# Patient Record
Sex: Female | Born: 1935 | Hispanic: Yes
Health system: Southern US, Community
[De-identification: ages and names within clinical notes are randomized; demographics above are authoritative.]

## PROBLEM LIST (undated history)

## (undated) DIAGNOSIS — I639 Cerebral infarction, unspecified: Secondary | ICD-10-CM

## (undated) DIAGNOSIS — I509 Heart failure, unspecified: Secondary | ICD-10-CM

## (undated) DIAGNOSIS — F0390 Unspecified dementia without behavioral disturbance: Secondary | ICD-10-CM

## (undated) DIAGNOSIS — I499 Cardiac arrhythmia, unspecified: Secondary | ICD-10-CM

---

## 2018-01-27 ENCOUNTER — Emergency Department (HOSPITAL_COMMUNITY): Payer: Medicare Other

## 2018-01-27 ENCOUNTER — Inpatient Hospital Stay (HOSPITAL_COMMUNITY): Payer: Medicare Other

## 2018-01-27 ENCOUNTER — Inpatient Hospital Stay (HOSPITAL_COMMUNITY)
Admission: EM | Admit: 2018-01-27 | Discharge: 2018-02-19 | DRG: 004 | Disposition: A | Discharge status: Rehabilitation Facility | Payer: Medicare Other | Attending: Pulmonary Disease | Admitting: Pulmonary Disease

## 2018-01-27 DIAGNOSIS — Y92009 Unspecified place in unspecified non-institutional (private) residence as the place of occurrence of the external cause: Secondary | ICD-10-CM

## 2018-01-27 DIAGNOSIS — T502X5A Adverse effect of carbonic-anhydrase inhibitors, benzothiadiazides and other diuretics, initial encounter: Secondary | ICD-10-CM | POA: Diagnosis not present

## 2018-01-27 DIAGNOSIS — N179 Acute kidney failure, unspecified: Secondary | ICD-10-CM | POA: Diagnosis not present

## 2018-01-27 DIAGNOSIS — Z9911 Dependence on respirator [ventilator] status: Secondary | ICD-10-CM | POA: Diagnosis not present

## 2018-01-27 DIAGNOSIS — Z8701 Personal history of pneumonia (recurrent): Secondary | ICD-10-CM

## 2018-01-27 DIAGNOSIS — Z880 Allergy status to penicillin: Secondary | ICD-10-CM

## 2018-01-27 DIAGNOSIS — J9811 Atelectasis: Secondary | ICD-10-CM | POA: Diagnosis not present

## 2018-01-27 DIAGNOSIS — R29702 NIHSS score 2: Secondary | ICD-10-CM | POA: Diagnosis present

## 2018-01-27 DIAGNOSIS — I482 Chronic atrial fibrillation, unspecified: Secondary | ICD-10-CM | POA: Diagnosis present

## 2018-01-27 DIAGNOSIS — Z66 Do not resuscitate: Secondary | ICD-10-CM | POA: Diagnosis not present

## 2018-01-27 DIAGNOSIS — E876 Hypokalemia: Secondary | ICD-10-CM | POA: Diagnosis not present

## 2018-01-27 DIAGNOSIS — I5033 Acute on chronic diastolic (congestive) heart failure: Secondary | ICD-10-CM | POA: Diagnosis present

## 2018-01-27 DIAGNOSIS — T41295A Adverse effect of other general anesthetics, initial encounter: Secondary | ICD-10-CM | POA: Diagnosis not present

## 2018-01-27 DIAGNOSIS — Z9289 Personal history of other medical treatment: Secondary | ICD-10-CM

## 2018-01-27 DIAGNOSIS — I952 Hypotension due to drugs: Secondary | ICD-10-CM | POA: Diagnosis not present

## 2018-01-27 DIAGNOSIS — J9600 Acute respiratory failure, unspecified whether with hypoxia or hypercapnia: Secondary | ICD-10-CM | POA: Diagnosis not present

## 2018-01-27 DIAGNOSIS — G8191 Hemiplegia, unspecified affecting right dominant side: Secondary | ICD-10-CM | POA: Diagnosis present

## 2018-01-27 DIAGNOSIS — Z930 Tracheostomy status: Secondary | ICD-10-CM | POA: Diagnosis not present

## 2018-01-27 DIAGNOSIS — I63512 Cerebral infarction due to unspecified occlusion or stenosis of left middle cerebral artery: Secondary | ICD-10-CM | POA: Diagnosis present

## 2018-01-27 DIAGNOSIS — D72829 Elevated white blood cell count, unspecified: Secondary | ICD-10-CM | POA: Diagnosis not present

## 2018-01-27 DIAGNOSIS — R1319 Other dysphagia: Secondary | ICD-10-CM

## 2018-01-27 DIAGNOSIS — I63412 Cerebral infarction due to embolism of left middle cerebral artery: Principal | ICD-10-CM | POA: Diagnosis present

## 2018-01-27 DIAGNOSIS — Z978 Presence of other specified devices: Secondary | ICD-10-CM

## 2018-01-27 DIAGNOSIS — S0011XA Contusion of right eyelid and periocular area, initial encounter: Secondary | ICD-10-CM | POA: Diagnosis present

## 2018-01-27 DIAGNOSIS — Z7982 Long term (current) use of aspirin: Secondary | ICD-10-CM

## 2018-01-27 DIAGNOSIS — Z91040 Latex allergy status: Secondary | ICD-10-CM

## 2018-01-27 DIAGNOSIS — R609 Edema, unspecified: Secondary | ICD-10-CM | POA: Diagnosis not present

## 2018-01-27 DIAGNOSIS — I5022 Chronic systolic (congestive) heart failure: Secondary | ICD-10-CM | POA: Diagnosis not present

## 2018-01-27 DIAGNOSIS — E870 Hyperosmolality and hypernatremia: Secondary | ICD-10-CM | POA: Diagnosis not present

## 2018-01-27 DIAGNOSIS — K5900 Constipation, unspecified: Secondary | ICD-10-CM | POA: Diagnosis not present

## 2018-01-27 DIAGNOSIS — R402432 Glasgow coma scale score 3-8, at arrival to emergency department: Secondary | ICD-10-CM

## 2018-01-27 DIAGNOSIS — E785 Hyperlipidemia, unspecified: Secondary | ICD-10-CM | POA: Diagnosis not present

## 2018-01-27 DIAGNOSIS — Z931 Gastrostomy status: Secondary | ICD-10-CM

## 2018-01-27 DIAGNOSIS — I639 Cerebral infarction, unspecified: Secondary | ICD-10-CM

## 2018-01-27 DIAGNOSIS — W19XXXA Unspecified fall, initial encounter: Secondary | ICD-10-CM | POA: Diagnosis present

## 2018-01-27 DIAGNOSIS — Z8673 Personal history of transient ischemic attack (TIA), and cerebral infarction without residual deficits: Secondary | ICD-10-CM

## 2018-01-27 DIAGNOSIS — J9601 Acute respiratory failure with hypoxia: Secondary | ICD-10-CM

## 2018-01-27 DIAGNOSIS — G935 Compression of brain: Secondary | ICD-10-CM | POA: Diagnosis not present

## 2018-01-27 DIAGNOSIS — I619 Nontraumatic intracerebral hemorrhage, unspecified: Secondary | ICD-10-CM | POA: Diagnosis not present

## 2018-01-27 DIAGNOSIS — R1310 Dysphagia, unspecified: Secondary | ICD-10-CM | POA: Diagnosis present

## 2018-01-27 DIAGNOSIS — I248 Other forms of acute ischemic heart disease: Secondary | ICD-10-CM | POA: Diagnosis present

## 2018-01-27 DIAGNOSIS — G9340 Encephalopathy, unspecified: Secondary | ICD-10-CM | POA: Diagnosis present

## 2018-01-27 DIAGNOSIS — I4821 Permanent atrial fibrillation: Secondary | ICD-10-CM | POA: Diagnosis not present

## 2018-01-27 DIAGNOSIS — J9690 Respiratory failure, unspecified, unspecified whether with hypoxia or hypercapnia: Secondary | ICD-10-CM

## 2018-01-27 DIAGNOSIS — Z79899 Other long term (current) drug therapy: Secondary | ICD-10-CM

## 2018-01-27 DIAGNOSIS — Z515 Encounter for palliative care: Secondary | ICD-10-CM | POA: Diagnosis not present

## 2018-01-27 DIAGNOSIS — M7989 Other specified soft tissue disorders: Secondary | ICD-10-CM | POA: Diagnosis not present

## 2018-01-27 DIAGNOSIS — Z7189 Other specified counseling: Secondary | ICD-10-CM

## 2018-01-27 DIAGNOSIS — R197 Diarrhea, unspecified: Secondary | ICD-10-CM | POA: Diagnosis not present

## 2018-01-27 LAB — COMPREHENSIVE METABOLIC PANEL
ALK PHOS: 66 U/L (ref 38–126)
ALT: 38 U/L (ref 0–44)
ANION GAP: 14 (ref 5–15)
AST: 66 U/L — ABNORMAL HIGH (ref 15–41)
Albumin: 3.6 g/dL (ref 3.5–5.0)
BUN: 23 mg/dL (ref 8–23)
CO2: 22 mmol/L (ref 22–32)
Calcium: 9.3 mg/dL (ref 8.9–10.3)
Chloride: 102 mmol/L (ref 98–111)
Creatinine, Ser: 1.19 mg/dL — ABNORMAL HIGH (ref 0.44–1.00)
GFR calc Af Amer: 48 mL/min — ABNORMAL LOW (ref >60)
GFR calc non Af Amer: 41 mL/min — ABNORMAL LOW (ref >60)
GLUCOSE: 120 mg/dL — AB (ref 70–99)
Potassium: 3.6 mmol/L (ref 3.5–5.1)
Sodium: 138 mmol/L (ref 135–145)
Total Bilirubin: 1.1 mg/dL (ref 0.3–1.2)
Total Protein: 7.6 g/dL (ref 6.5–8.1)

## 2018-01-27 LAB — CBC
HCT: 43.7 % (ref 36.0–46.0)
Hemoglobin: 13.4 g/dL (ref 12.0–15.0)
MCH: 28.3 pg (ref 26.0–34.0)
MCHC: 30.7 g/dL (ref 30.0–36.0)
MCV: 92.4 fL (ref 80.0–100.0)
Platelets: 244 10*3/uL (ref 150–400)
RBC: 4.73 MIL/uL (ref 3.87–5.11)
RDW: 16.1 % — ABNORMAL HIGH (ref 11.5–15.5)
WBC: 13.2 10*3/uL — ABNORMAL HIGH (ref 4.0–10.5)
nRBC: 0 % (ref 0.0–0.2)

## 2018-01-27 LAB — I-STAT TROPONIN, ED: Troponin i, poc: 0.32 ng/mL (ref 0.00–0.08)

## 2018-01-27 LAB — PROTIME-INR
INR: 1.12
Prothrombin Time: 14.3 seconds (ref 11.4–15.2)

## 2018-01-27 LAB — DIFFERENTIAL
Abs Immature Granulocytes: 0.07 10*3/uL (ref 0.00–0.07)
Basophils Absolute: 0 10*3/uL (ref 0.0–0.1)
Basophils Relative: 0 %
Eosinophils Absolute: 0 10*3/uL (ref 0.0–0.5)
Eosinophils Relative: 0 %
Immature Granulocytes: 1 %
LYMPHS ABS: 0.7 10*3/uL (ref 0.7–4.0)
Lymphocytes Relative: 5 %
Monocytes Absolute: 1.5 10*3/uL — ABNORMAL HIGH (ref 0.1–1.0)
Monocytes Relative: 11 %
Neutro Abs: 10.9 10*3/uL — ABNORMAL HIGH (ref 1.7–7.7)
Neutrophils Relative %: 83 %

## 2018-01-27 LAB — GLUCOSE, CAPILLARY: Glucose-Capillary: 86 mg/dL (ref 70–99)

## 2018-01-27 LAB — MRSA PCR SCREENING: MRSA by PCR: NEGATIVE

## 2018-01-27 LAB — APTT: aPTT: 28 seconds (ref 24–36)

## 2018-01-27 LAB — I-STAT CREATININE, ED: CREATININE: 1 mg/dL (ref 0.44–1.00)

## 2018-01-27 MED ORDER — PROPOFOL 1000 MG/100ML IV EMUL
5.0000 ug/kg/min | INTRAVENOUS | Status: DC
  Administered 2018-01-28 (×2): 5 ug/kg/min via INTRAVENOUS
  Filled 2018-01-27 (×2): qty 100

## 2018-01-27 MED ORDER — SENNOSIDES-DOCUSATE SODIUM 8.6-50 MG PO TABS: 1.0000 | ORAL_TABLET | Freq: Every evening | ORAL | Status: DC | PRN

## 2018-01-27 MED ORDER — CHLORHEXIDINE GLUCONATE 0.12% ORAL RINSE (MEDLINE KIT)
15.0000 mL | Freq: Two times a day (BID) | OROMUCOSAL | Status: DC
  Administered 2018-01-27 – 2018-02-19 (×46): 15 mL via OROMUCOSAL

## 2018-01-27 MED ORDER — ASPIRIN 300 MG RE SUPP: 300.0000 mg | Freq: Every day | RECTAL | Status: DC

## 2018-01-27 MED ORDER — ACETAMINOPHEN 160 MG/5ML PO SOLN
650.0000 mg | ORAL | Status: DC | PRN
  Administered 2018-01-28 – 2018-02-16 (×6): 650 mg
  Filled 2018-01-27 (×7): qty 20.3

## 2018-01-27 MED ORDER — PANTOPRAZOLE SODIUM 40 MG PO PACK
40.0000 mg | PACK | Freq: Every day | ORAL | Status: DC
  Administered 2018-01-28 – 2018-02-19 (×21): 40 mg
  Filled 2018-01-27 (×21): qty 20

## 2018-01-27 MED ORDER — ACETAMINOPHEN 325 MG PO TABS: 650.0000 mg | ORAL_TABLET | ORAL | Status: DC | PRN

## 2018-01-27 MED ORDER — IOPAMIDOL (ISOVUE-370) INJECTION 76%
INTRAVENOUS | Status: AC
  Administered 2018-01-27: 50 mL
  Filled 2018-01-27: qty 50

## 2018-01-27 MED ORDER — ORAL CARE MOUTH RINSE
15.0000 mL | OROMUCOSAL | Status: DC
  Administered 2018-01-27 – 2018-02-19 (×225): 15 mL via OROMUCOSAL

## 2018-01-27 MED ORDER — ROCURONIUM BROMIDE 50 MG/5ML IV SOLN
70.0000 mg | Freq: Once | INTRAVENOUS | Status: DC
  Filled 2018-01-27: qty 7

## 2018-01-27 MED ORDER — ASPIRIN 325 MG PO TABS: 325.0000 mg | ORAL_TABLET | Freq: Every day | ORAL | Status: DC

## 2018-01-27 MED ORDER — ACETAMINOPHEN 650 MG RE SUPP: 650.0000 mg | RECTAL | Status: DC | PRN

## 2018-01-27 MED ORDER — STROKE: EARLY STAGES OF RECOVERY BOOK
Freq: Once | Status: AC
  Administered 2018-02-12: 22:00:00
  Filled 2018-01-27: qty 1

## 2018-01-27 MED ORDER — SODIUM CHLORIDE 0.9% FLUSH: 3.0000 mL | Freq: Once | INTRAVENOUS | Status: DC

## 2018-01-27 MED ORDER — ETOMIDATE 2 MG/ML IV SOLN
INTRAVENOUS | Status: AC | PRN
  Administered 2018-01-27: 20 mg via INTRAVENOUS

## 2018-01-27 MED ORDER — SUCCINYLCHOLINE CHLORIDE 20 MG/ML IJ SOLN
INTRAMUSCULAR | Status: AC | PRN
  Administered 2018-01-27: 70 mg via INTRAVENOUS

## 2018-01-27 MED ORDER — PROPOFOL 1000 MG/100ML IV EMUL
INTRAVENOUS | Status: AC
  Administered 2018-01-27: 17:00:00
  Filled 2018-01-27: qty 100

## 2018-01-27 NOTE — Consult Note (Addendum)
NEURO HOSPITALIST  CONSULT   Requesting Physician: Dr. Clayborne Dana    Chief Complaint: Right hemiplegia  History obtained from:  EMS  HPI:                                                                                                                                         Devonia Farro is an 85 y.o. female with unknown PMH who presented to the [facility] ED as a code stroke for right hemiplegia. She was found down by family.  Per EMS the patient lives alone. Family saw her last night at 2100 completely normal. Today at 1430 they went to her house and found her down and unresponsive. EMS was called. Per EMS right side was flaccid. No prior stroke history. Patient does have a large hematoma on the right side of her face. No seizure like activity noted. No urinary orf bowel incontinence.   ED course:  Patient was intubated in the ED. BP: 190/120 BG: 120   Date last known well: Date: 01/26/2018 Time last known well: Time: 21:00 tPA Given: No:  Outside of time window Modified Rankin: Rankin Score=0  No past medical history on file.   The histories are not reviewed yet. Please review them in the "History" navigator section and refresh this SmartLink.  No family history on file.       Social History:  has no history on file for tobacco, alcohol, and drug.  Allergies: Allergies not on file  Medications:                                                                                                                           Current Facility-Administered Medications  Medication Dose Route Frequency Provider Last Rate Last Dose  . iopamidol (ISOVUE-370) 76 % injection           . propofol (DIPRIVAN) 1000 MG/100ML infusion           . rocuronium (ZEMURON) injection 70 mg  70 mg Intravenous Once Scheidler, Rhona Leavens II, MD      . sodium chloride flush (NS) 0.9 % injection  3 mL  3 mL Intravenous Once Mesner, Barbara CowerJason, MD       No current outpatient  medications on file.    ROS:                                                                                                                                        unobtainable from patient due to mental status   General Examination:                                                                                                      Blood pressure (!) 142/106, pulse 92, resp. rate (!) 25, SpO2 100 %.  HEENT-  Right eye edematous with ecchymosis present. Lungs Intubated Extremities- Warm and well perfused Musculoskeletal- generalized edema  Abbreviated emergent neuro exam prior to intubation: Patient was able to hold her left arm up. Unable to lift RUE against gravity. Would withdraw LUE and BLE to painful stimuli. Patient would withdraw RLE more briskly than left lower extremity. No incontinence noted. Right facial hematoma. Toes down going bilaterally.    Neurological Examination after intubation Mental Status: Patient intubated, unable to follow commands. Cranial Nerves: No blink to threat. No pupillary light reflex. No doll's eye reflex. (recent pharmacological paralysis for intubation) Motor/Sensory: No withdrawal to noxious stimuli. (recent pharmacological paralysis for intubation) Deep Tendon Reflexes: muted Plantars: muted Cerebellar: UTA Gait: UTA   Lab Results: Basic Metabolic Panel: Recent Labs  Lab 01/27/18 1605  CREATININE 1.00    CBC: Recent Labs  Lab 01/27/18 1553  WBC 13.2*  NEUTROABS 10.9*  HGB 13.4  HCT 43.7  MCV 92.4  PLT 244     CBG: No results for input(s): GLUCAP in the last 168 hours.  Imaging: Ct Angio Head W Or Wo Contrast  Result Date: 01/27/2018 CLINICAL DATA:  Code stroke. Found on the ground. Right-sided weakness. EXAM: CT ANGIOGRAPHY HEAD AND NECK TECHNIQUE: Multidetector CT imaging of the head and neck was performed using the standard protocol during bolus administration of intravenous contrast. Multiplanar CT image  reconstructions and MIPs were obtained to evaluate the vascular anatomy. Carotid stenosis measurements (when applicable) are obtained utilizing NASCET criteria, using the distal internal carotid diameter as the denominator. CONTRAST:  50mL ISOVUE-370 IOPAMIDOL (ISOVUE-370) INJECTION 76% COMPARISON:  CT earlier same day FINDINGS: CTA NECK FINDINGS Aortic arch: Aortic atherosclerosis with considerable luminal irregularity. Branching pattern of great toe cephalic vessels from the arch is normal. No origin stenosis greater than 30%. Right  carotid system: Right common carotid artery is tortuous but widely patent to the bifurcation. Carotid bifurcation is free of atherosclerotic plaque or narrowing. Right cervical ICA is widely patent though tortuous. Left carotid system: Common carotid artery is tortuous but widely patent to the bifurcation. Carotid bifurcation shows minimal calcified plaque at the proximal ICA but there is no stenosis. Cervical ICA shows focal narrowing with wall thickening and irregularity in its midportion with diameter of 2.6 mm. This is a stenosis of about 50%. The wall irregularity presumably represents plaque/thrombus and could be a source of embolus. Vertebral arteries: There is atherosclerotic plaque at both vertebral artery origins but no stenosis greater than 30%. Beyond that, both vertebral arteries are widely patent through the cervical region to the foramen magnum. Skeleton: Ordinary cervical spondylosis. Upper thoracic congenital failure of separation. Other neck: No mass or lymphadenopathy. Upper chest: Right pleural effusion. Pulmonary scarring. Question interstitial edema. Review of the MIP images confirms the above findings CTA HEAD FINDINGS Anterior circulation: Both internal carotid arteries are patent through the siphon region. On the right, there is atherosclerotic calcification but no stenosis. The anterior and middle cerebral vessels are patent. On the left, there is complete  occlusion of the left M1 segment due to an embolus. Flow is present within the left anterior cerebral artery. There is poor collateral demonstration, though the contrast phase is quite early. Posterior circulation: Both vertebral arteries are patent to the basilar. Early phase contrast enhancement makes it difficult to accurately evaluate the distal vertebral arteries but I think both are sufficiently patent to the basilar. No basilar stenosis is seen. Superior cerebellar and posterior cerebral vessels are patent, though there distal branches are not yet opacified. Venous sinuses: Too early to assess venous sinuses. Anatomic variants: None other significant. Delayed phase: Not performed. Review of the MIP images confirms the above findings IMPRESSION: 1. Complete occlusion of the left M1 segment due to an embolus. Flow is present within the left anterior cerebral artery. 2. Atherosclerotic disease at both carotid bifurcations but no stenosis. 3. Focal narrowing and irregularity of the mid cervical ICA on the left with diameter of 2.6 mm. This probably represents ulcerated soft plaque and this could be a source of embolus. 4. These results were communicated to Dr. Otelia Limes at 4:50 pmon 1/26/2020by text page via the [facility] messaging system. Electronically Signed   By: Paulina Fusi M.D.   On: 01/27/2018 16:54   Ct Angio Neck W Or Wo Contrast  Result Date: 01/27/2018 CLINICAL DATA:  Code stroke. Found on the ground. Right-sided weakness. EXAM: CT ANGIOGRAPHY HEAD AND NECK TECHNIQUE: Multidetector CT imaging of the head and neck was performed using the standard protocol during bolus administration of intravenous contrast. Multiplanar CT image reconstructions and MIPs were obtained to evaluate the vascular anatomy. Carotid stenosis measurements (when applicable) are obtained utilizing NASCET criteria, using the distal internal carotid diameter as the denominator. CONTRAST:  40mL ISOVUE-370 IOPAMIDOL (ISOVUE-370)  INJECTION 76% COMPARISON:  CT earlier same day FINDINGS: CTA NECK FINDINGS Aortic arch: Aortic atherosclerosis with considerable luminal irregularity. Branching pattern of great toe cephalic vessels from the arch is normal. No origin stenosis greater than 30%. Right carotid system: Right common carotid artery is tortuous but widely patent to the bifurcation. Carotid bifurcation is free of atherosclerotic plaque or narrowing. Right cervical ICA is widely patent though tortuous. Left carotid system: Common carotid artery is tortuous but widely patent to the bifurcation. Carotid bifurcation shows minimal calcified plaque at the proximal ICA but  there is no stenosis. Cervical ICA shows focal narrowing with wall thickening and irregularity in its midportion with diameter of 2.6 mm. This is a stenosis of about 50%. The wall irregularity presumably represents plaque/thrombus and could be a source of embolus. Vertebral arteries: There is atherosclerotic plaque at both vertebral artery origins but no stenosis greater than 30%. Beyond that, both vertebral arteries are widely patent through the cervical region to the foramen magnum. Skeleton: Ordinary cervical spondylosis. Upper thoracic congenital failure of separation. Other neck: No mass or lymphadenopathy. Upper chest: Right pleural effusion. Pulmonary scarring. Question interstitial edema. Review of the MIP images confirms the above findings CTA HEAD FINDINGS Anterior circulation: Both internal carotid arteries are patent through the siphon region. On the right, there is atherosclerotic calcification but no stenosis. The anterior and middle cerebral vessels are patent. On the left, there is complete occlusion of the left M1 segment due to an embolus. Flow is present within the left anterior cerebral artery. There is poor collateral demonstration, though the contrast phase is quite early. Posterior circulation: Both vertebral arteries are patent to the basilar. Early phase  contrast enhancement makes it difficult to accurately evaluate the distal vertebral arteries but I think both are sufficiently patent to the basilar. No basilar stenosis is seen. Superior cerebellar and posterior cerebral vessels are patent, though there distal branches are not yet opacified. Venous sinuses: Too early to assess venous sinuses. Anatomic variants: None other significant. Delayed phase: Not performed. Review of the MIP images confirms the above findings IMPRESSION: 1. Complete occlusion of the left M1 segment due to an embolus. Flow is present within the left anterior cerebral artery. 2. Atherosclerotic disease at both carotid bifurcations but no stenosis. 3. Focal narrowing and irregularity of the mid cervical ICA on the left with diameter of 2.6 mm. This probably represents ulcerated soft plaque and this could be a source of embolus. 4. These results were communicated to Dr. Otelia Limes at 4:50 pmon 1/26/2020by text page via the [facility] messaging system. Electronically Signed   By: Paulina Fusi M.D.   On: 01/27/2018 16:54   Ct Cervical Spine Wo Contrast  Result Date: 01/27/2018 CLINICAL DATA:  Found down. Stroke. Clinical concern for cervical spine fracture. EXAM: CT CERVICAL SPINE WITHOUT CONTRAST TECHNIQUE: Multidetector CT imaging of the cervical spine was performed without intravenous contrast. Multiplanar CT image reconstructions were also generated. COMPARISON:  Head CT obtained at the same time. FINDINGS: Alignment: Minimal reversal of the normal lordosis. No subluxations. Skull base and vertebrae: No acute fracture. No primary bone lesion or focal pathologic process. Soft tissues and spinal canal: No prevertebral fluid or swelling. No visible canal hematoma. Disc levels:  Multilevel degenerative changes. Upper chest: Minimal biapical bullous changes. Small to moderate-sized right pleural effusion. Other: Endotracheal and orogastric tubes are in place. Small right lobe thyroid nodule measuring 6  mm in maximum diameter. Minimal carotid artery calcification on the left. IMPRESSION: 1. No fracture or subluxation. 2. Minimal reversal of the normal lordosis. 3. Multilevel degenerative changes. 4. Small to moderate-sized right pleural effusion. 5. Minimal left carotid artery atheromatous calcification. 6. 6 mm right lobe thyroid nodule. This is too small to characterize, but most likely benign in the absence of known clinical risk factors for thyroid carcinoma. Electronically Signed   By: Beckie Salts M.D.   On: 01/27/2018 16:46   Dg Chest Portable 1 View  Result Date: 01/27/2018 CLINICAL DATA:  Status post intubation EXAM: PORTABLE CHEST 1 VIEW COMPARISON:  None. FINDINGS:  Endotracheal tube is noted 2.5 cm above the carina. Gastric catheter extends into the stomach. Cardiac shadow is mildly enlarged. Aortic calcifications are seen. Bibasilar atelectatic changes are noted as well as suggestion of small effusions. No bony abnormality is noted. IMPRESSION: Tubes and lines as described above. Bibasilar atelectasis and likely small effusions. Electronically Signed   By: Alcide Clever M.D.   On: 01/27/2018 16:24   Ct Head Code Stroke Wo Contrast  Result Date: 01/27/2018 CLINICAL DATA:  Code stroke. Last seen normal 2100 hours yesterday. Flaccid on the right side. EXAM: CT HEAD WITHOUT CONTRAST TECHNIQUE: Contiguous axial images were obtained from the base of the skull through the vertex without intravenous contrast. COMPARISON:  None. FINDINGS: Brain: Brainstem and cerebellum are unremarkable except for chronic small-vessel ischemic changes of the pons. Old infarction in the left occipital lobe. Acute infarction in the left hemisphere in the MCA territory affecting the insula, deep white matter and cortical brain in 3 middle cerebral artery sectors. No evidence of hemorrhage. No mass effect or shift. Chronic small-vessel ischemic changes affect the white matter elsewhere. Vascular: Hyperdense left MCA in the  distal M1/bifurcation region. Skull: Negative Sinuses/Orbits: Clear/normal Other: None ASPECTS (Alberta Stroke Program Early CT Score) - Ganglionic level infarction (caudate, lentiform nuclei, internal capsule, insula, M1-M3 cortex): 4 - Supraganglionic infarction (M4-M6 cortex): 2 Total score (0-10 with 10 being normal): 6 IMPRESSION: 1. Acute infarction in the left MCA territory affecting the insula, deep white matter and cortical brain in 3 middle cerebral artery sectors. No hemorrhage or mass effect. 2. ASPECTS is 6. 3. Hyperdense left MCA. *This report was discussed by telephone with Dr. Otelia Limes at 1621 hours. Electronically Signed   By: Paulina Fusi M.D.   On: 01/27/2018 16:31   Valentina Lucks, MSN, NP-C [facility] [phone] 01/27/2018, 4:14 PM    Assessment: 85 y.o. female with PMHx of pneumonia who presented to [facility] ED as a code stroke for right side flaccidity. She was found down by family. Patient was intubated in the ED.  1. CT head following intubation for airway protection reveals an acute infarction in the left MCA territory affecting the insula, deep white matter and cortical brain in 3 middle cerebral artery sectors. No hemorrhage or mass effect. ASPECTS is 6. Hyperdense left MCA sign is noted.  2. CTA head: Complete occlusion of the left M1 segment due to an embolus. Flow is present within the left anterior cerebral artery.  3. CTA neck: Atherosclerotic disease at both carotid bifurcations but no stenosis. Focal narrowing and irregularity of the mid cervical ICA on the left with diameter of 2.6 mm. This probably represents ulcerated soft plaque and this could be a source of embolus. 4. Patient not a TPA candidate d/t presenting outside of the time window.  5. Not an IR candidate due to completed left MCA stroke with unacceptably high risk of hemorrhagic conversion.   6. Stroke Risk Factors - none known    Recommendations: -- BP goal: Permissive HTN up to 220/110  mmHg -- MRI Brain  -- Echocardiogram -- ASA 325 mg per tube or 300 mg per rectum qd -- Benefits of statin most likely outweighed by risks given advanced age  -- HgbA1c, fasting lipid panel -- PT consult, OT consult, Speech consult after extubation --Telemetry monitoring -- Frequent neuro checks -- Stroke swallow screen -- Vent management and management of medical comorbidities per ICU team -- High risk of mortality given advanced age and the size of the stroke. If  the patient survives this stroke, she may be a candidate for left CEA -- Repeat CT head in 24 hours if MRI not yet completed.  -- Please page stroke NP  Or  PA  Or MD from 8am -4 pm  as this patient from this time will be  followed by the stroke.   You can look them up on [email or web address]  Password [email or web address]  45 minutes spent in the emergent neurological evaluation and management of this critically ill patient  I have examined the patient. 85 year old female presenting with completed left MCA stroke. Exam reveals left sided motor deficit. CT images reviewed with completed left MCA stroke and left M1 occlusion noted. Plan is initiation of ASA and permissive HTN with stroke imaging work up. Frequent neuro checks and STAT CT head if her condition deteriorates.  Electronically signed: Dr. Caryl PinaEric Clemence Stillings

## 2018-01-27 NOTE — H&P (Signed)
NAME:  Kelli Vazquez, MRN:  [id number], DOB:  1929-06-22, LOS: 0 ADMISSION DATE:  01/27/2018, CONSULTATION DATE:  01/27/2018 REFERRING MD:  Dr. Clayborne Dana, MD CHIEF COMPLAINT: AMS  Brief History   85yo female with hypertension, CHF, and afib (possible new onset) presenting after being found by her daughter unconscious with embolic left MCA stroke.   History of present illness   Patient is an 85yo female with CHF and hypertension. She was found unconscious on the floor by her daughter in her home at 1500, last known time conscious at 2000 the evening before. She lives alone, and previous to this was at baseline independent mental status. She was brought in by EMS and intubated for airway protection.   CT head showed left embolic MCA stroke and neurology was consulted. She was noted to have atrial fibrillation on EKG but family states she was not previously diagnosed with afib. She sees Dr. Luiz Iron in [facility], [place] as her PCP. Per family she takes metolazone, asa, and no other blood thinners.  PCCM was consulted for airway management and she will be admitted to the ICU.   Past Medical History  HTN CHF  Significant Hospital Events   Intubated 1/26  Consults:  PCCM Neurology   Procedures:  none  Significant Diagnostic Tests:  CTA 1/26: 1. Complete occlusion of the left M1 segment due to an embolus. Focal narrowing and irregularity of the mid cervical ICA on the left with diameter of 2.6 mm. This probably represents ulcerated soft plaque and this could be a source of embolus.  Micro Data:  none  Antimicrobials:  none  Interim history/subjective:  When seen in the ED patient was sedated and intubated. Family at bedside, discussed patient's condition and seriousness of her prognosis.   Objective   Blood pressure (!) 162/118, pulse 70, resp. rate 18, height 5\' 3"  (1.6 m), weight 68 kg, SpO2 100 %.    Vent Mode: PRVC FiO2 (%):  [40 %] 40 % Set Rate:  [20 bmp] 20 bmp Vt Set:  [460 mL]  460 mL PEEP:  [5 cmH20] 5 cmH20 Plateau Pressure:  [20 cmH20] 20 cmH20  No intake or output data in the 24 hours ending 01/27/18 1748 Filed Weights   01/27/18 1724  Weight: 68 kg    Examination: General: sedated, NAD HENT: ETT tube in place, hematoma around right eye  Eyes: left pupil equal and reactive, unable to open right lid  Lungs: +rhonchi and crackles bilaterally  Cardiovascular: irregular rate and rhythm, no m/r/g Abdomen: soft, non-distended, does not grimace with palpation  Extremities: no edema, intermittently moving left extremity > right Neuro: sedated  Resolved Hospital Problem list     Assessment & Plan:  Acute Ventilatory Dependent Respiratory Failure: Due to AMS, intubated for airway protection. -- Full vent support -- Propofol for RASS goal 0 to -1 -- AM CXR, abg  Large MCA Stroke: Last known normal 9 pm yesterday, found down by family at 3 pm. Outside of window for tpa or intervention. Obtunded with right sided paralysis on arrival.  -- Neuro consult -- MRI Brain  -- Allow permissive HTN (SBP < 220 and DBP < 120) -- Lipid panel, Hgb A1c -- ASA 325 daily  -- Statin pending lipid panel -- Echocardiogram  -- PT / OT / SLP once extubated -- Frequent neuro checks -- NIHSS    Hx of Heart Failure Hx HTN -- Needs med rec -- Caution with IVFs -- Permissive HTN   Best practice:  Diet: NPO  Pain/Anxiety/Delirium protocol (if indicated): propofol VAP protocol (if indicated): yes DVT prophylaxis: SCDs GI prophylaxis: PPI  Glucose control: none Mobility: intubated, sedated Code Status: Full Family Communication: discussed with family at bedside Disposition: admit to ICU   Labs   CBC: Recent Labs  Lab 01/27/18 1553  WBC 13.2*  NEUTROABS 10.9*  HGB 13.4  HCT 43.7  MCV 92.4  PLT 244    Basic Metabolic Panel: Recent Labs  Lab 01/27/18 1553 01/27/18 1605  NA 138  --   K 3.6  --   CL 102  --   CO2 22  --   GLUCOSE 120*  --   BUN 23  --    CREATININE 1.19* 1.00  CALCIUM 9.3  --    GFR: Estimated Creatinine Clearance: 37.4 mL/min (by C-G formula based on SCr of 1 mg/dL). Recent Labs  Lab 01/27/18 1553  WBC 13.2*    Liver Function Tests: Recent Labs  Lab 01/27/18 1553  AST 66*  ALT 38  ALKPHOS 66  BILITOT 1.1  PROT 7.6  ALBUMIN 3.6   No results for input(s): LIPASE, AMYLASE in the last 168 hours. No results for input(s): AMMONIA in the last 168 hours.  ABG No results found for: PHART, PCO2ART, PO2ART, HCO3, TCO2, ACIDBASEDEF, O2SAT   Coagulation Profile: Recent Labs  Lab 01/27/18 1553  INR 1.12    Cardiac Enzymes: No results for input(s): CKTOTAL, CKMB, CKMBINDEX, TROPONINI in the last 168 hours.  HbA1C: No results found for: HGBA1C  CBG: No results for input(s): GLUCAP in the last 168 hours.  Review of Systems:   Unable to obtain as patient is intubated and sedated   Past Medical History  She,  has no past medical history on file.   Surgical History    No known surgical history   Social History    Patient lives at home alone  Unknown if she drinks or smokes   Family History   Her family history is not on file.   Allergies Not on File   Home Medications  Prior to Admission medications   Not on File     Critical care time:

## 2018-01-27 NOTE — Progress Notes (Signed)
Patient intubated for airway protection due to unresponsiveness, good color change on ETCO2 detector, good BBS, SATS 100%, placed on above vent settings MD aware, will continue to monitor.

## 2018-01-27 NOTE — ED Triage Notes (Signed)
Pt arrives via EMS from home alone where she was last seen by family at 9:30 last night. Pt found pt at 2:30 today on floor with hematoma on eye.

## 2018-01-27 NOTE — ED Provider Notes (Signed)
[facility] EMERGENCY DEPARTMENT Provider Note   CSN: [id number] Arrival date & time: 01/27/18  1548     History   Chief Complaint Chief Complaint  Patient presents with  . Code Stroke    HPI Analis Distler is a 85 y.o. female.  HPI   Braylin Formby is a 85 y.o. female with PMH of unknown who presents via EMS as a code stroke.  She reportedly was last seen normal by her family last night around 9 or 9:30 PM.  They had not heard from her yet this morning and then at 2:30 PM they checked on her and found her unresponsive with what appeared to be snoring respirations.  Hematoma to the right periorbital space.  Patient was tachycardic on EMS initial presentation in the low 100s and hypertensive in the 160s to 180s.  Glucose normal.  No past medical history on file.  Patient Active Problem List   Diagnosis Date Noted  . Acute ischemic left MCA stroke ([place]) 01/27/2018  . Respiratory failure ([place])      OB History   No obstetric history on file.      Home Medications    Prior to Admission medications   Not on File    Family History No family history on file.  Social History Social History   Tobacco Use  . Smoking status: Not on file  Substance Use Topics  . Alcohol use: Not on file  . Drug use: Not on file     Allergies   Patient has no allergy information on record.   Review of Systems Review of Systems  Unable to perform ROS: Mental status change     Physical Exam Updated Vital Signs BP (!) 153/89   Pulse 84   Temp 98.3 F (36.8 C) (Oral)   Resp 20   Ht 5' 3"  (1.6 m)   Wt 68 kg   SpO2 100%   BMI 26.57 kg/m   Physical Exam Vitals signs and nursing note reviewed.  Constitutional:      General: She is in acute distress.     Appearance: She is well-developed. She is ill-appearing.     Interventions: Face mask in place.  HENT:     Head: Normocephalic and atraumatic. Right periorbital erythema present. No Battle's sign, left  periorbital erythema or laceration.     Comments: Right periorbital hematoma, small to moderate. Eyes:     Conjunctiva/sclera: Conjunctivae normal.     Pupils: Pupils are equal, round, and reactive to light.     Comments: Pupils 3 to 4 mm and sluggish but reactive bilaterally.  Neck:     Musculoskeletal: Neck supple.  Cardiovascular:     Rate and Rhythm: Regular rhythm. Tachycardia present.     Heart sounds: S1 normal and S2 normal. No murmur.  Pulmonary:     Effort: Accessory muscle usage present. No respiratory distress.     Breath sounds: Transmitted upper airway sounds present. Rhonchi (Bilateral.) present.     Comments: Stertor present Abdominal:     Palpations: Abdomen is soft.     Tenderness: There is no abdominal tenderness.  Skin:    General: Skin is warm and dry.  Neurological:     Mental Status: She is unresponsive.     GCS: GCS eye subscore is 1. GCS verbal subscore is 1. GCS motor subscore is 4.     Comments: Spontaneously moves her left forearm and hand only.      ED Treatments /  Results  Labs (all labs ordered are listed, but only abnormal results are displayed) Labs Reviewed  CBC - Abnormal; Notable for the following components:      Result Value   WBC 13.2 (*)    RDW 16.1 (*)    All other components within normal limits  DIFFERENTIAL - Abnormal; Notable for the following components:   Neutro Abs 10.9 (*)    Monocytes Absolute 1.5 (*)    All other components within normal limits  COMPREHENSIVE METABOLIC PANEL - Abnormal; Notable for the following components:   Glucose, Bld 120 (*)    Creatinine, Ser 1.19 (*)    AST 66 (*)    GFR calc non Af Amer 41 (*)    GFR calc Af Amer 48 (*)    All other components within normal limits  I-STAT TROPONIN, ED - Abnormal; Notable for the following components:   Troponin i, poc 0.32 (*)    All other components within normal limits  MRSA PCR SCREENING  PROTIME-INR  APTT  GLUCOSE, CAPILLARY  HEMOGLOBIN A1C  LIPID  PANEL  COMPREHENSIVE METABOLIC PANEL  CBC  BLOOD GAS, ARTERIAL  MAGNESIUM  PHOSPHORUS  CBG MONITORING, ED  I-STAT CREATININE, ED    EKG None  Radiology Ct Angio Head W Or Wo Contrast  Result Date: 01/27/2018 CLINICAL DATA:  Code stroke. Found on the ground. Right-sided weakness. EXAM: CT ANGIOGRAPHY HEAD AND NECK TECHNIQUE: Multidetector CT imaging of the head and neck was performed using the standard protocol during bolus administration of intravenous contrast. Multiplanar CT image reconstructions and MIPs were obtained to evaluate the vascular anatomy. Carotid stenosis measurements (when applicable) are obtained utilizing NASCET criteria, using the distal internal carotid diameter as the denominator. CONTRAST:  24m ISOVUE-370 IOPAMIDOL (ISOVUE-370) INJECTION 76% COMPARISON:  CT earlier same day FINDINGS: CTA NECK FINDINGS Aortic arch: Aortic atherosclerosis with considerable luminal irregularity. Branching pattern of great toe cephalic vessels from the arch is normal. No origin stenosis greater than 30%. Right carotid system: Right common carotid artery is tortuous but widely patent to the bifurcation. Carotid bifurcation is free of atherosclerotic plaque or narrowing. Right cervical ICA is widely patent though tortuous. Left carotid system: Common carotid artery is tortuous but widely patent to the bifurcation. Carotid bifurcation shows minimal calcified plaque at the proximal ICA but there is no stenosis. Cervical ICA shows focal narrowing with wall thickening and irregularity in its midportion with diameter of 2.6 mm. This is a stenosis of about 50%. The wall irregularity presumably represents plaque/thrombus and could be a source of embolus. Vertebral arteries: There is atherosclerotic plaque at both vertebral artery origins but no stenosis greater than 30%. Beyond that, both vertebral arteries are widely patent through the cervical region to the foramen magnum. Skeleton: Ordinary cervical  spondylosis. Upper thoracic congenital failure of separation. Other neck: No mass or lymphadenopathy. Upper chest: Right pleural effusion. Pulmonary scarring. Question interstitial edema. Review of the MIP images confirms the above findings CTA HEAD FINDINGS Anterior circulation: Both internal carotid arteries are patent through the siphon region. On the right, there is atherosclerotic calcification but no stenosis. The anterior and middle cerebral vessels are patent. On the left, there is complete occlusion of the left M1 segment due to an embolus. Flow is present within the left anterior cerebral artery. There is poor collateral demonstration, though the contrast phase is quite early. Posterior circulation: Both vertebral arteries are patent to the basilar. Early phase contrast enhancement makes it difficult to accurately evaluate the distal  vertebral arteries but I think both are sufficiently patent to the basilar. No basilar stenosis is seen. Superior cerebellar and posterior cerebral vessels are patent, though there distal branches are not yet opacified. Venous sinuses: Too early to assess venous sinuses. Anatomic variants: None other significant. Delayed phase: Not performed. Review of the MIP images confirms the above findings IMPRESSION: 1. Complete occlusion of the left M1 segment due to an embolus. Flow is present within the left anterior cerebral artery. 2. Atherosclerotic disease at both carotid bifurcations but no stenosis. 3. Focal narrowing and irregularity of the mid cervical ICA on the left with diameter of 2.6 mm. This probably represents ulcerated soft plaque and this could be a source of embolus. 4. These results were communicated to Dr. Cheral Marker at 4:50 pmon 1/26/2020by text page via the [facility] messaging system. Electronically Signed   By: Nelson Chimes M.D.   On: 01/27/2018 16:54   Ct Angio Neck W Or Wo Contrast  Result Date: 01/27/2018 CLINICAL DATA:  Code stroke. Found on the ground.  Right-sided weakness. EXAM: CT ANGIOGRAPHY HEAD AND NECK TECHNIQUE: Multidetector CT imaging of the head and neck was performed using the standard protocol during bolus administration of intravenous contrast. Multiplanar CT image reconstructions and MIPs were obtained to evaluate the vascular anatomy. Carotid stenosis measurements (when applicable) are obtained utilizing NASCET criteria, using the distal internal carotid diameter as the denominator. CONTRAST:  30m ISOVUE-370 IOPAMIDOL (ISOVUE-370) INJECTION 76% COMPARISON:  CT earlier same day FINDINGS: CTA NECK FINDINGS Aortic arch: Aortic atherosclerosis with considerable luminal irregularity. Branching pattern of great toe cephalic vessels from the arch is normal. No origin stenosis greater than 30%. Right carotid system: Right common carotid artery is tortuous but widely patent to the bifurcation. Carotid bifurcation is free of atherosclerotic plaque or narrowing. Right cervical ICA is widely patent though tortuous. Left carotid system: Common carotid artery is tortuous but widely patent to the bifurcation. Carotid bifurcation shows minimal calcified plaque at the proximal ICA but there is no stenosis. Cervical ICA shows focal narrowing with wall thickening and irregularity in its midportion with diameter of 2.6 mm. This is a stenosis of about 50%. The wall irregularity presumably represents plaque/thrombus and could be a source of embolus. Vertebral arteries: There is atherosclerotic plaque at both vertebral artery origins but no stenosis greater than 30%. Beyond that, both vertebral arteries are widely patent through the cervical region to the foramen magnum. Skeleton: Ordinary cervical spondylosis. Upper thoracic congenital failure of separation. Other neck: No mass or lymphadenopathy. Upper chest: Right pleural effusion. Pulmonary scarring. Question interstitial edema. Review of the MIP images confirms the above findings CTA HEAD FINDINGS Anterior  circulation: Both internal carotid arteries are patent through the siphon region. On the right, there is atherosclerotic calcification but no stenosis. The anterior and middle cerebral vessels are patent. On the left, there is complete occlusion of the left M1 segment due to an embolus. Flow is present within the left anterior cerebral artery. There is poor collateral demonstration, though the contrast phase is quite early. Posterior circulation: Both vertebral arteries are patent to the basilar. Early phase contrast enhancement makes it difficult to accurately evaluate the distal vertebral arteries but I think both are sufficiently patent to the basilar. No basilar stenosis is seen. Superior cerebellar and posterior cerebral vessels are patent, though there distal branches are not yet opacified. Venous sinuses: Too early to assess venous sinuses. Anatomic variants: None other significant. Delayed phase: Not performed. Review of the MIP images confirms  the above findings IMPRESSION: 1. Complete occlusion of the left M1 segment due to an embolus. Flow is present within the left anterior cerebral artery. 2. Atherosclerotic disease at both carotid bifurcations but no stenosis. 3. Focal narrowing and irregularity of the mid cervical ICA on the left with diameter of 2.6 mm. This probably represents ulcerated soft plaque and this could be a source of embolus. 4. These results were communicated to Dr. Cheral Marker at 4:50 pmon 1/26/2020by text page via the [facility] messaging system. Electronically Signed   By: Nelson Chimes M.D.   On: 01/27/2018 16:54   Ct Cervical Spine Wo Contrast  Result Date: 01/27/2018 CLINICAL DATA:  Found down. Stroke. Clinical concern for cervical spine fracture. EXAM: CT CERVICAL SPINE WITHOUT CONTRAST TECHNIQUE: Multidetector CT imaging of the cervical spine was performed without intravenous contrast. Multiplanar CT image reconstructions were also generated. COMPARISON:  Head CT obtained at the same  time. FINDINGS: Alignment: Minimal reversal of the normal lordosis. No subluxations. Skull base and vertebrae: No acute fracture. No primary bone lesion or focal pathologic process. Soft tissues and spinal canal: No prevertebral fluid or swelling. No visible canal hematoma. Disc levels:  Multilevel degenerative changes. Upper chest: Minimal biapical bullous changes. Small to moderate-sized right pleural effusion. Other: Endotracheal and orogastric tubes are in place. Small right lobe thyroid nodule measuring 6 mm in maximum diameter. Minimal carotid artery calcification on the left. IMPRESSION: 1. No fracture or subluxation. 2. Minimal reversal of the normal lordosis. 3. Multilevel degenerative changes. 4. Small to moderate-sized right pleural effusion. 5. Minimal left carotid artery atheromatous calcification. 6. 6 mm right lobe thyroid nodule. This is too small to characterize, but most likely benign in the absence of known clinical risk factors for thyroid carcinoma. Electronically Signed   By: Claudie Revering M.D.   On: 01/27/2018 16:46   Dg Chest Portable 1 View  Result Date: 01/27/2018 CLINICAL DATA:  Status post intubation EXAM: PORTABLE CHEST 1 VIEW COMPARISON:  None. FINDINGS: Endotracheal tube is noted 2.5 cm above the carina. Gastric catheter extends into the stomach. Cardiac shadow is mildly enlarged. Aortic calcifications are seen. Bibasilar atelectatic changes are noted as well as suggestion of small effusions. No bony abnormality is noted. IMPRESSION: Tubes and lines as described above. Bibasilar atelectasis and likely small effusions. Electronically Signed   By: Inez Catalina M.D.   On: 01/27/2018 16:24   Ct Head Code Stroke Wo Contrast  Result Date: 01/27/2018 CLINICAL DATA:  Code stroke. Last seen normal 2100 hours yesterday. Flaccid on the right side. EXAM: CT HEAD WITHOUT CONTRAST TECHNIQUE: Contiguous axial images were obtained from the base of the skull through the vertex without  intravenous contrast. COMPARISON:  None. FINDINGS: Brain: Brainstem and cerebellum are unremarkable except for chronic small-vessel ischemic changes of the pons. Old infarction in the left occipital lobe. Acute infarction in the left hemisphere in the MCA territory affecting the insula, deep white matter and cortical brain in 3 middle cerebral artery sectors. No evidence of hemorrhage. No mass effect or shift. Chronic small-vessel ischemic changes affect the white matter elsewhere. Vascular: Hyperdense left MCA in the distal M1/bifurcation region. Skull: Negative Sinuses/Orbits: Clear/normal Other: None ASPECTS ([place] Stroke Program Early CT Score) - Ganglionic level infarction (caudate, lentiform nuclei, internal capsule, insula, M1-M3 cortex): 4 - Supraganglionic infarction (M4-M6 cortex): 2 Total score (0-10 with 10 being normal): 6 IMPRESSION: 1. Acute infarction in the left MCA territory affecting the insula, deep white matter and cortical brain in 3 middle  cerebral artery sectors. No hemorrhage or mass effect. 2. ASPECTS is 6. 3. Hyperdense left MCA. *This report was discussed by telephone with Dr. Cheral Marker at 1621 hours. Electronically Signed   By: Nelson Chimes M.D.   On: 01/27/2018 16:31    Procedures Procedures (including critical care time)  Medications Ordered in ED Medications  propofol (DIPRIVAN) 1000 MG/100ML infusion (has no administration in time range)   stroke: mapping our early stages of recovery book (has no administration in time range)  acetaminophen (TYLENOL) tablet 650 mg (has no administration in time range)    Or  acetaminophen (TYLENOL) solution 650 mg (has no administration in time range)    Or  acetaminophen (TYLENOL) suppository 650 mg (has no administration in time range)  senna-docusate (Senokot-S) tablet 1 tablet (has no administration in time range)  aspirin suppository 300 mg (has no administration in time range)    Or  aspirin tablet 325 mg (has no  administration in time range)  pantoprazole sodium (PROTONIX) 40 mg/20 mL oral suspension 40 mg (has no administration in time range)  chlorhexidine gluconate (MEDLINE KIT) (PERIDEX) 0.12 % solution 15 mL (15 mLs Mouth Rinse Given 01/27/18 2251)  MEDLINE mouth rinse (has no administration in time range)  etomidate (AMIDATE) injection (20 mg Intravenous Given 01/27/18 1556)  succinylcholine (ANECTINE) injection (70 mg Intravenous Given 01/27/18 1558)  propofol (DIPRIVAN) 1000 MG/100ML infusion (  New Bag/Given 01/27/18 1723)  iopamidol (ISOVUE-370) 76 % injection (50 mLs  Contrast Given 01/27/18 1630)     Initial Impression / Assessment and Plan / ED Course  I have reviewed the triage vital signs and the nursing notes.  Pertinent labs & imaging results that were available during my care of the patient were reviewed by me and considered in my medical decision making (see chart for details).     MDM:  Imaging: Chest x-ray shows ET tube 2.5 cm above the carina.  Gastric tube in the stomach.  Bibasilar atelectasis and likely small effusions.  CT code stroke and CT C-spine show L MCA infarct as above.  ED Provider Interpretation of EKG: A. fib with a rate of 109 bpm, normal axis, no ST segment elevation or depression or pathologic T wave changes.  No interval irregularity.  Labs: CBC with a white count of 13 and 83% PMNs otherwise unremarkable, i-STAT creatinine 1, INR 1.12, CMP creatinine 1.19, glucose 120, AST 66 otherwise largely unremarkable.  Troponin 0.3  On initial evaluation, patient appears ill and unresponsive. Afebrile, tachycardic, hypertensive in the 160s presents as a code stroke as detailed above in the HPI.  On exam, patient has equal pupils that are sluggish but reactive.  Periorbital hematoma.  Right eye is not bulging.  Hematoma is small to moderate.  No additional evidence for trauma at this time.  Moves her left arm only intermittently spontaneously and pain with no other  movement.  Concern for inability to protect her airway and facemask was continued, 6 L nasal cannula placed as well.  And abated according to documentation above with etomidate and rocuronium.  Chest x-ray confirmed adequate tube placement.  End-tidal normal.  Placed on vent and propofol for sedation.  Neurology was present on patient's initial arrival to the emergency department and accompanied her to CT scan.  CT code stroke and C-spine showed left MCA infarct as detailed above.  EKG shows no evidence for acute ischemia and shows atrial fibrillation as above.  Troponin 0.3 but suspect this is in the setting of  demand ischemia.  Labs otherwise largely unremarkable.  Patient was admitted to critical care.  The plan for this patient was discussed with Dr. Dayna Barker who voiced agreement and who oversaw evaluation and treatment of this patient.    Final Clinical Impressions(s) / ED Diagnoses   Final diagnoses:  Acute ischemic left MCA stroke ([place])  Endotracheally intubated  Glasgow coma scale total score 3-8, at arrival to emergency department [facility])    ED Discharge Orders    None       Jaziyah Gradel, Rodena Goldmann, MD 01/27/18 3007    Merrily Pew, MD 01/28/18 [phone]

## 2018-01-27 NOTE — Progress Notes (Signed)
Significant difference in NIH score received in ICU compared to ED. CCM made aware. Will continue to monitor closely.

## 2018-01-27 NOTE — ED Notes (Signed)
Patient transported to CT 

## 2018-01-28 ENCOUNTER — Inpatient Hospital Stay (HOSPITAL_COMMUNITY): Payer: Medicare Other

## 2018-01-28 ENCOUNTER — Ambulatory Visit (HOSPITAL_COMMUNITY): Payer: Medicare Other

## 2018-01-28 DIAGNOSIS — I63412 Cerebral infarction due to embolism of left middle cerebral artery: Principal | ICD-10-CM

## 2018-01-28 DIAGNOSIS — I4821 Permanent atrial fibrillation: Secondary | ICD-10-CM

## 2018-01-28 DIAGNOSIS — J9600 Acute respiratory failure, unspecified whether with hypoxia or hypercapnia: Secondary | ICD-10-CM

## 2018-01-28 DIAGNOSIS — I5022 Chronic systolic (congestive) heart failure: Secondary | ICD-10-CM

## 2018-01-28 DIAGNOSIS — E785 Hyperlipidemia, unspecified: Secondary | ICD-10-CM

## 2018-01-28 DIAGNOSIS — Z978 Presence of other specified devices: Secondary | ICD-10-CM

## 2018-01-28 DIAGNOSIS — I63512 Cerebral infarction due to unspecified occlusion or stenosis of left middle cerebral artery: Secondary | ICD-10-CM

## 2018-01-28 LAB — GLUCOSE, CAPILLARY
Glucose-Capillary: 90 mg/dL (ref 70–99)
Glucose-Capillary: 107 mg/dL — ABNORMAL HIGH (ref 70–99)
Glucose-Capillary: 165 mg/dL — ABNORMAL HIGH (ref 70–99)

## 2018-01-28 LAB — COMPREHENSIVE METABOLIC PANEL
ALT: 37 U/L (ref 0–44)
ANION GAP: 16 — AB (ref 5–15)
AST: 74 U/L — ABNORMAL HIGH (ref 15–41)
Albumin: 3.4 g/dL — ABNORMAL LOW (ref 3.5–5.0)
Alkaline Phosphatase: 63 U/L (ref 38–126)
BUN: 23 mg/dL (ref 8–23)
CO2: 23 mmol/L (ref 22–32)
Calcium: 9.1 mg/dL (ref 8.9–10.3)
Chloride: 97 mmol/L — ABNORMAL LOW (ref 98–111)
Creatinine, Ser: 1.23 mg/dL — ABNORMAL HIGH (ref 0.44–1.00)
GFR calc Af Amer: 46 mL/min — ABNORMAL LOW (ref >60)
GFR calc non Af Amer: 40 mL/min — ABNORMAL LOW (ref >60)
Glucose, Bld: 87 mg/dL (ref 70–99)
Potassium: 3.7 mmol/L (ref 3.5–5.1)
Sodium: 136 mmol/L (ref 135–145)
Total Bilirubin: 1.7 mg/dL — ABNORMAL HIGH (ref 0.3–1.2)
Total Protein: 7.6 g/dL (ref 6.5–8.1)

## 2018-01-28 LAB — LIPID PANEL
CHOL/HDL RATIO: 4 ratio
Cholesterol: 168 mg/dL (ref 0–200)
HDL: 42 mg/dL (ref >40)
LDL Cholesterol: 111 mg/dL — ABNORMAL HIGH (ref 0–99)
Triglycerides: 75 mg/dL (ref <150)
VLDL: 15 mg/dL (ref 0–40)

## 2018-01-28 LAB — BLOOD GAS, ARTERIAL
Acid-Base Excess: 2.4 mmol/L — ABNORMAL HIGH (ref 0.0–2.0)
Bicarbonate: 24.4 mmol/L (ref 20.0–28.0)
Drawn by: 44135
FIO2: 40
MECHVT: 460 mL
O2 Saturation: 97.6 %
PATIENT TEMPERATURE: 99
PEEP: 5 cmH2O
RATE: 20 resp/min
pCO2 arterial: 25.8 mmHg — ABNORMAL LOW (ref 32.0–48.0)
pH, Arterial: 7.583 — ABNORMAL HIGH (ref 7.350–7.450)
pO2, Arterial: 92.3 mmHg (ref 83.0–108.0)

## 2018-01-28 LAB — CBC
HCT: 44.6 % (ref 36.0–46.0)
HEMOGLOBIN: 14.5 g/dL (ref 12.0–15.0)
MCH: 28.7 pg (ref 26.0–34.0)
MCHC: 32.5 g/dL (ref 30.0–36.0)
MCV: 88.1 fL (ref 80.0–100.0)
Platelets: 240 10*3/uL (ref 150–400)
RBC: 5.06 MIL/uL (ref 3.87–5.11)
RDW: 16.4 % — ABNORMAL HIGH (ref 11.5–15.5)
WBC: 14.2 10*3/uL — ABNORMAL HIGH (ref 4.0–10.5)
nRBC: 0 % (ref 0.0–0.2)

## 2018-01-28 LAB — ECHOCARDIOGRAM COMPLETE
Height: 63 in
Weight: 2400 oz

## 2018-01-28 LAB — MAGNESIUM
MAGNESIUM: 2 mg/dL (ref 1.7–2.4)
Magnesium: 2 mg/dL (ref 1.7–2.4)

## 2018-01-28 LAB — PHOSPHORUS
Phosphorus: 3.5 mg/dL (ref 2.5–4.6)
Phosphorus: 4 mg/dL (ref 2.5–4.6)

## 2018-01-28 LAB — HEMOGLOBIN A1C
Hgb A1c MFr Bld: 5.5 % (ref 4.8–5.6)
Mean Plasma Glucose: 111.15 mg/dL

## 2018-01-28 MED ORDER — PRO-STAT SUGAR FREE PO LIQD
30.0000 mL | Freq: Two times a day (BID) | ORAL | Status: DC
  Administered 2018-01-28 – 2018-01-29 (×3): 30 mL
  Filled 2018-01-28 (×3): qty 30

## 2018-01-28 MED ORDER — ATORVASTATIN CALCIUM 10 MG PO TABS
20.0000 mg | ORAL_TABLET | Freq: Every day | ORAL | Status: DC
  Administered 2018-01-28 – 2018-02-11 (×15): 20 mg via ORAL
  Filled 2018-01-28 (×18): qty 2

## 2018-01-28 MED ORDER — VITAL HIGH PROTEIN PO LIQD
1000.0000 mL | ORAL | Status: DC
  Administered 2018-01-28: 1000 mL

## 2018-01-28 MED ORDER — HEPARIN SODIUM (PORCINE) 5000 UNIT/ML IJ SOLN
5000.0000 [IU] | Freq: Three times a day (TID) | INTRAMUSCULAR | Status: DC
  Administered 2018-01-28 – 2018-02-17 (×59): 5000 [IU] via SUBCUTANEOUS
  Filled 2018-01-28 (×60): qty 1

## 2018-01-28 NOTE — Progress Notes (Signed)
  Echocardiogram 2D Echocardiogram has been performed.  Gerda Diss 01/28/2018, 4:05 PM

## 2018-01-28 NOTE — Progress Notes (Signed)
NAME:  Kelli RenshawMercedes Hino, MRN:  [id number], DOB:  April 20, 1929, LOS: 1 ADMISSION DATE:  01/27/2018, CONSULTATION DATE:  1/26  REFERRING MD:  Mesner, CHIEF COMPLAINT:  Acute encephalopathy   Brief History   85 y/o female admitted on 1/26 with embolic left MCA stroke.   Past Medical History  HTN, CHF  Significant Hospital Events   Admission/intubation 1/26  Consults:  PCCM Neurology  Procedures:  none  Significant Diagnostic Tests:  CTA 1/26: 1. Complete occlusion of the left M1 segment due to an embolus. Focal narrowing and irregularity of the mid cervical ICA on the left with diameter of 2.6 mm. This probably represents ulcerated soft plaque and this could be a source of embolus. MRI brain 1/27> large ischemic left MCA stroke, no midline shift  Micro Data:    Antimicrobials:     Interim history/subjective:  Drowsy, will reach for ETT with left hand  Objective   Blood pressure 123/60, pulse 76, temperature 97.7 F (36.5 C), temperature source Axillary, resp. rate 20, height 5\' 3"  (1.6 m), weight 68 kg, SpO2 100 %.    Vent Mode: PRVC FiO2 (%):  [40 %] 40 % Set Rate:  [20 bmp] 20 bmp Vt Set:  [460 mL] 460 mL PEEP:  [5 cmH20] 5 cmH20 Plateau Pressure:  [18 cmH20-24 cmH20] 24 cmH20   Intake/Output Summary (Last 24 hours) at 01/28/2018 1049 Last data filed at 01/28/2018 0900 Gross per 24 hour  Intake 33.5 ml  Output 250 ml  Net -216.5 ml   Filed Weights   01/27/18 1724  Weight: 68 kg    Examination:  General:  In bed on vent HENT: NCAT ETT in place PULM: CTA B, vent supported breathing CV: RRR, no mgr GI: BS+, soft, nontender MSK: normal bulk and tone Neuro: sleepy but will show me her left hand on command    Resolved Hospital Problem list     Assessment & Plan:  Acute respiratory failure with hypoxemia > continue full vent support today > when mental status improves consider extubation > VAP prevention > Daily WUA/SBT  Acute large left MCA stroke >  continue to monitor neuro status closely > secondary stroke prevention and BP goals per neurology  Hypertension > goals per neurology  Atrial fibrillation > monitor with Tele  Best practice:  Diet: start tube feeding Pain/Anxiety/Delirium protocol (if indicated): yes PAD protocol RASS goal 0 to -1 VAP protocol (if indicated): yes DVT prophylaxis: Sub q hep GI prophylaxis: pantoprazole Glucose control: SSI Mobility: bed rest Code Status: full Family Communication: updated daughter bedside Disposition: remain in ICU  Labs   CBC: Recent Labs  Lab 01/27/18 1553 01/28/18 0639  WBC 13.2* 14.2*  NEUTROABS 10.9*  --   HGB 13.4 14.5  HCT 43.7 44.6  MCV 92.4 88.1  PLT 244 240    Basic Metabolic Panel: Recent Labs  Lab 01/27/18 1553 01/27/18 1605 01/28/18 0639  NA 138  --  136  K 3.6  --  3.7  CL 102  --  97*  CO2 22  --  23  GLUCOSE 120*  --  87  BUN 23  --  23  CREATININE 1.19* 1.00 1.23*  CALCIUM 9.3  --  9.1  MG  --   --  2.0  PHOS  --   --  3.5   GFR: Estimated Creatinine Clearance: 30.4 mL/min (A) (by C-G formula based on SCr of 1.23 mg/dL (H)). Recent Labs  Lab 01/27/18 1553 01/28/18 0639  WBC  13.2* 14.2*    Liver Function Tests: Recent Labs  Lab 01/27/18 1553 01/28/18 0639  AST 66* 74*  ALT 38 37  ALKPHOS 66 63  BILITOT 1.1 1.7*  PROT 7.6 7.6  ALBUMIN 3.6 3.4*   No results for input(s): LIPASE, AMYLASE in the last 168 hours. No results for input(s): AMMONIA in the last 168 hours.  ABG    Component Value Date/Time   PHART 7.583 (H) 01/28/2018 0615   PCO2ART 25.8 (L) 01/28/2018 0615   PO2ART 92.3 01/28/2018 0615   HCO3 24.4 01/28/2018 0615   O2SAT 97.6 01/28/2018 0615     Coagulation Profile: Recent Labs  Lab 01/27/18 1553  INR 1.12    Cardiac Enzymes: No results for input(s): CKTOTAL, CKMB, CKMBINDEX, TROPONINI in the last 168 hours.  HbA1C: Hgb A1c MFr Bld  Date/Time Value Ref Range Status  01/28/2018 06:39 AM 5.5 4.8 -  5.6 % Final    Comment:    (NOTE) Pre diabetes:          5.7%-6.4% Diabetes:              >6.4% Glycemic control for   <7.0% adults with diabetes     CBG: Recent Labs  Lab 01/27/18 2006  GLUCAP 86     Critical care time: 35 minutes    Heber Onley, MD [place] [facility] Pager: [phone] Cell: [phone] If no response, call [phone]

## 2018-01-28 NOTE — Progress Notes (Signed)
STROKE TEAM PROGRESS NOTE   SUBJECTIVE (INTERVAL HISTORY) Her daughter is at the bedside.  Overall her condition is stable. She is still intubated and not tolerating weaning trial this am. She was able to open eyes briefly with voice and pain, moving LUE spontaneously but not following any commands. Still has right hemiplegia and right hemianopia.   OBJECTIVE Temp:  [97.7 F (36.5 C)-99 F (37.2 C)] 97.7 F (36.5 C) (01/27 0800) Pulse Rate:  [68-97] 92 (01/27 1121) Cardiac Rhythm: Atrial fibrillation (01/27 1000) Resp:  [15-25] 20 (01/27 1000) BP: (107-173)/(60-124) 135/76 (01/27 1121) SpO2:  [96 %-100 %] 100 % (01/27 1121) FiO2 (%):  [40 %] 40 % (01/27 1121) Weight:  [68 kg] 68 kg (01/26 1724)  Recent Labs  Lab 01/27/18 2006  GLUCAP 86   Recent Labs  Lab 01/27/18 1553 01/27/18 1605 01/28/18 0639  NA 138  --  136  K 3.6  --  3.7  CL 102  --  97*  CO2 22  --  23  GLUCOSE 120*  --  87  BUN 23  --  23  CREATININE 1.19* 1.00 1.23*  CALCIUM 9.3  --  9.1  MG  --   --  2.0  PHOS  --   --  3.5   Recent Labs  Lab 01/27/18 1553 01/28/18 0639  AST 66* 74*  ALT 38 37  ALKPHOS 66 63  BILITOT 1.1 1.7*  PROT 7.6 7.6  ALBUMIN 3.6 3.4*   Recent Labs  Lab 01/27/18 1553 01/28/18 0639  WBC 13.2* 14.2*  NEUTROABS 10.9*  --   HGB 13.4 14.5  HCT 43.7 44.6  MCV 92.4 88.1  PLT 244 240   No results for input(s): CKTOTAL, CKMB, CKMBINDEX, TROPONINI in the last 168 hours. Recent Labs    01/27/18 1553  LABPROT 14.3  INR 1.12   No results for input(s): COLORURINE, LABSPEC, PHURINE, GLUCOSEU, HGBUR, BILIRUBINUR, KETONESUR, PROTEINUR, UROBILINOGEN, NITRITE, LEUKOCYTESUR in the last 72 hours.  Invalid input(s): APPERANCEUR     Component Value Date/Time   CHOL 168 01/28/2018 0639   TRIG 75 01/28/2018 0639   HDL 42 01/28/2018 0639   CHOLHDL 4.0 01/28/2018 0639   VLDL 15 01/28/2018 0639   LDLCALC 111 (H) 01/28/2018 0639   Lab Results  Component Value Date   HGBA1C 5.5  01/28/2018   No results found for: LABOPIA, COCAINSCRNUR, LABBENZ, AMPHETMU, THCU, LABBARB  No results for input(s): ETH in the last 168 hours.  I have personally reviewed the radiological images below and agree with the radiology interpretations.  Ct Angio Head W Or Wo Contrast  Result Date: 01/27/2018 CLINICAL DATA:  Code stroke. Found on the ground. Right-sided weakness. EXAM: CT ANGIOGRAPHY HEAD AND NECK TECHNIQUE: Multidetector CT imaging of the head and neck was performed using the standard protocol during bolus administration of intravenous contrast. Multiplanar CT image reconstructions and MIPs were obtained to evaluate the vascular anatomy. Carotid stenosis measurements (when applicable) are obtained utilizing NASCET criteria, using the distal internal carotid diameter as the denominator. CONTRAST:  50mL ISOVUE-370 IOPAMIDOL (ISOVUE-370) INJECTION 76% COMPARISON:  CT earlier same day FINDINGS: CTA NECK FINDINGS Aortic arch: Aortic atherosclerosis with considerable luminal irregularity. Branching pattern of great toe cephalic vessels from the arch is normal. No origin stenosis greater than 30%. Right carotid system: Right common carotid artery is tortuous but widely patent to the bifurcation. Carotid bifurcation is free of atherosclerotic plaque or narrowing. Right cervical ICA is widely patent though tortuous. Left carotid  system: Common carotid artery is tortuous but widely patent to the bifurcation. Carotid bifurcation shows minimal calcified plaque at the proximal ICA but there is no stenosis. Cervical ICA shows focal narrowing with wall thickening and irregularity in its midportion with diameter of 2.6 mm. This is a stenosis of about 50%. The wall irregularity presumably represents plaque/thrombus and could be a source of embolus. Vertebral arteries: There is atherosclerotic plaque at both vertebral artery origins but no stenosis greater than 30%. Beyond that, both vertebral arteries are widely  patent through the cervical region to the foramen magnum. Skeleton: Ordinary cervical spondylosis. Upper thoracic congenital failure of separation. Other neck: No mass or lymphadenopathy. Upper chest: Right pleural effusion. Pulmonary scarring. Question interstitial edema. Review of the MIP images confirms the above findings CTA HEAD FINDINGS Anterior circulation: Both internal carotid arteries are patent through the siphon region. On the right, there is atherosclerotic calcification but no stenosis. The anterior and middle cerebral vessels are patent. On the left, there is complete occlusion of the left M1 segment due to an embolus. Flow is present within the left anterior cerebral artery. There is poor collateral demonstration, though the contrast phase is quite early. Posterior circulation: Both vertebral arteries are patent to the basilar. Early phase contrast enhancement makes it difficult to accurately evaluate the distal vertebral arteries but I think both are sufficiently patent to the basilar. No basilar stenosis is seen. Superior cerebellar and posterior cerebral vessels are patent, though there distal branches are not yet opacified. Venous sinuses: Too early to assess venous sinuses. Anatomic variants: None other significant. Delayed phase: Not performed. Review of the MIP images confirms the above findings IMPRESSION: 1. Complete occlusion of the left M1 segment due to an embolus. Flow is present within the left anterior cerebral artery. 2. Atherosclerotic disease at both carotid bifurcations but no stenosis. 3. Focal narrowing and irregularity of the mid cervical ICA on the left with diameter of 2.6 mm. This probably represents ulcerated soft plaque and this could be a source of embolus. 4. These results were communicated to Dr. Otelia Limes at 4:50 pmon 1/26/2020by text page via the [facility] messaging system. Electronically Signed   By: Paulina Fusi M.D.   On: 01/27/2018 16:54   Ct Angio Neck W Or Wo  Contrast  Result Date: 01/27/2018 CLINICAL DATA:  Code stroke. Found on the ground. Right-sided weakness. EXAM: CT ANGIOGRAPHY HEAD AND NECK TECHNIQUE: Multidetector CT imaging of the head and neck was performed using the standard protocol during bolus administration of intravenous contrast. Multiplanar CT image reconstructions and MIPs were obtained to evaluate the vascular anatomy. Carotid stenosis measurements (when applicable) are obtained utilizing NASCET criteria, using the distal internal carotid diameter as the denominator. CONTRAST:  42mL ISOVUE-370 IOPAMIDOL (ISOVUE-370) INJECTION 76% COMPARISON:  CT earlier same day FINDINGS: CTA NECK FINDINGS Aortic arch: Aortic atherosclerosis with considerable luminal irregularity. Branching pattern of great toe cephalic vessels from the arch is normal. No origin stenosis greater than 30%. Right carotid system: Right common carotid artery is tortuous but widely patent to the bifurcation. Carotid bifurcation is free of atherosclerotic plaque or narrowing. Right cervical ICA is widely patent though tortuous. Left carotid system: Common carotid artery is tortuous but widely patent to the bifurcation. Carotid bifurcation shows minimal calcified plaque at the proximal ICA but there is no stenosis. Cervical ICA shows focal narrowing with wall thickening and irregularity in its midportion with diameter of 2.6 mm. This is a stenosis of about 50%. The wall irregularity presumably  represents plaque/thrombus and could be a source of embolus. Vertebral arteries: There is atherosclerotic plaque at both vertebral artery origins but no stenosis greater than 30%. Beyond that, both vertebral arteries are widely patent through the cervical region to the foramen magnum. Skeleton: Ordinary cervical spondylosis. Upper thoracic congenital failure of separation. Other neck: No mass or lymphadenopathy. Upper chest: Right pleural effusion. Pulmonary scarring. Question interstitial edema.  Review of the MIP images confirms the above findings CTA HEAD FINDINGS Anterior circulation: Both internal carotid arteries are patent through the siphon region. On the right, there is atherosclerotic calcification but no stenosis. The anterior and middle cerebral vessels are patent. On the left, there is complete occlusion of the left M1 segment due to an embolus. Flow is present within the left anterior cerebral artery. There is poor collateral demonstration, though the contrast phase is quite early. Posterior circulation: Both vertebral arteries are patent to the basilar. Early phase contrast enhancement makes it difficult to accurately evaluate the distal vertebral arteries but I think both are sufficiently patent to the basilar. No basilar stenosis is seen. Superior cerebellar and posterior cerebral vessels are patent, though there distal branches are not yet opacified. Venous sinuses: Too early to assess venous sinuses. Anatomic variants: None other significant. Delayed phase: Not performed. Review of the MIP images confirms the above findings IMPRESSION: 1. Complete occlusion of the left M1 segment due to an embolus. Flow is present within the left anterior cerebral artery. 2. Atherosclerotic disease at both carotid bifurcations but no stenosis. 3. Focal narrowing and irregularity of the mid cervical ICA on the left with diameter of 2.6 mm. This probably represents ulcerated soft plaque and this could be a source of embolus. 4. These results were communicated to Dr. Otelia Limes at 4:50 pmon 1/26/2020by text page via the [facility] messaging system. Electronically Signed   By: Paulina Fusi M.D.   On: 01/27/2018 16:54   Ct Cervical Spine Wo Contrast  Result Date: 01/27/2018 CLINICAL DATA:  Found down. Stroke. Clinical concern for cervical spine fracture. EXAM: CT CERVICAL SPINE WITHOUT CONTRAST TECHNIQUE: Multidetector CT imaging of the cervical spine was performed without intravenous contrast. Multiplanar CT image  reconstructions were also generated. COMPARISON:  Head CT obtained at the same time. FINDINGS: Alignment: Minimal reversal of the normal lordosis. No subluxations. Skull base and vertebrae: No acute fracture. No primary bone lesion or focal pathologic process. Soft tissues and spinal canal: No prevertebral fluid or swelling. No visible canal hematoma. Disc levels:  Multilevel degenerative changes. Upper chest: Minimal biapical bullous changes. Small to moderate-sized right pleural effusion. Other: Endotracheal and orogastric tubes are in place. Small right lobe thyroid nodule measuring 6 mm in maximum diameter. Minimal carotid artery calcification on the left. IMPRESSION: 1. No fracture or subluxation. 2. Minimal reversal of the normal lordosis. 3. Multilevel degenerative changes. 4. Small to moderate-sized right pleural effusion. 5. Minimal left carotid artery atheromatous calcification. 6. 6 mm right lobe thyroid nodule. This is too small to characterize, but most likely benign in the absence of known clinical risk factors for thyroid carcinoma. Electronically Signed   By: Beckie Salts M.D.   On: 01/27/2018 16:46   Mr Brain Wo Contrast  Result Date: 01/28/2018 CLINICAL DATA:  Found unconscious at home.  MCA occlusion. EXAM: MRI HEAD WITHOUT CONTRAST TECHNIQUE: Multiplanar, multiecho pulse sequences of the brain and surrounding structures were obtained without intravenous contrast. COMPARISON:  CTA head neck 01/27/2018 FINDINGS: BRAIN: Large area of ischemic infarct within the left MCA territory,  involving most of the frontal operculum and insula, as well as parts of the left basal ganglia and left temporal lobe. The midline structures are normal. There is moderate edema throughout the ischemic territory. Early confluent hyperintense T2-weighted signal of the periventricular and deep white matter, most commonly due to chronic ischemic microangiopathy. There is petechial hemorrhage throughout the infarcted area.  There is laminar necrosis at a site of remote infarct in the left occipital lobe. VASCULAR: Major intracranial arterial and venous sinus flow voids are normal. SKULL AND UPPER CERVICAL SPINE: Right frontal scalp hematoma. SINUSES/ORBITS: No fluid levels or advanced mucosal thickening. No mastoid or middle ear effusion. The orbits are normal. IMPRESSION: 1. Large ischemic infarct within the left MCA territory with confluent petechial hemorrhage throughout the affected area Heidelberg classification 1b: HI2, confluent petechiae, no mass effect. 2. No midline shift or hydrocephalus. Electronically Signed   By: Deatra RobinsonKevin  Herman M.D.   On: 01/28/2018 04:50   Ct Head Code Stroke Wo Contrast  Result Date: 01/27/2018 CLINICAL DATA:  Code stroke. Last seen normal 2100 hours yesterday. Flaccid on the right side. EXAM: CT HEAD WITHOUT CONTRAST TECHNIQUE: Contiguous axial images were obtained from the base of the skull through the vertex without intravenous contrast. COMPARISON:  None. FINDINGS: Brain: Brainstem and cerebellum are unremarkable except for chronic small-vessel ischemic changes of the pons. Old infarction in the left occipital lobe. Acute infarction in the left hemisphere in the MCA territory affecting the insula, deep white matter and cortical brain in 3 middle cerebral artery sectors. No evidence of hemorrhage. No mass effect or shift. Chronic small-vessel ischemic changes affect the white matter elsewhere. Vascular: Hyperdense left MCA in the distal M1/bifurcation region. Skull: Negative Sinuses/Orbits: Clear/normal Other: None ASPECTS (Alberta Stroke Program Early CT Score) - Ganglionic level infarction (caudate, lentiform nuclei, internal capsule, insula, M1-M3 cortex): 4 - Supraganglionic infarction (M4-M6 cortex): 2 Total score (0-10 with 10 being normal): 6 IMPRESSION: 1. Acute infarction in the left MCA territory affecting the insula, deep white matter and cortical brain in 3 middle cerebral artery  sectors. No hemorrhage or mass effect. 2. ASPECTS is 6. 3. Hyperdense left MCA. *This report was discussed by telephone with Dr. Otelia LimesLindzen at 1621 hours. Electronically Signed   By: Paulina FusiMark  Shogry M.D.   On: 01/27/2018 16:31    PHYSICAL EXAM  Temp:  [97.7 F (36.5 C)-99 F (37.2 C)] 97.7 F (36.5 C) (01/27 0800) Pulse Rate:  [68-97] 92 (01/27 1121) Resp:  [15-25] 20 (01/27 1000) BP: (107-173)/(60-124) 135/76 (01/27 1121) SpO2:  [96 %-100 %] 100 % (01/27 1121) FiO2 (%):  [40 %] 40 % (01/27 1121) Weight:  [68 kg] 68 kg (01/26 1724)  General - Well nourished, well developed, intubated on low dose sedation  Ophthalmologic - fundi not visualized due to noncooperation.  Cardiovascular - irregularly irregular heart rate and rhythm.  Neuro - intubated on low dose sedation, eyes briefly open with pain, not following commands. With forced eye opening, eyes in left gaze position, not blinking to visual threat on the left, doll's eyes present, not tracking, PERRL. Corneal reflex present on the left but absent on the right, gag and cough present. Breathing over the vent.  Facial symmetry not able to test due to ET tube.  Tongue midline in mouth. On pain stimulation, slight withdraw of RUE and RLE, mild withdraw of LLE and 3-/5 on LUE. DTR 1+ and positive babinski on the right. Sensation, coordination and gait not tested.   ASSESSMENT/PLAN Ms. France RavensMercedes  Augusto Garbein is a 85 y.o. female spanish speaking only with no documented history but seems to have afib and CHF and HTN admitted for found down at home, unresponsive, with right side flaccid. Intubated in ED. No tPA given due to outside window and established infarct.   Stroke:  left large MCA infarct embolic pattern secondary to afib not on AC  CT head - old left PCA infarct, acute left MCA large infarct  MRI  Left large MCA infarct with hemorrhagic conversion, old left PCA infarct  CTA head and neck - left M1 occlusion  2D Echo pending  CT repeat in am  pending  LDL 111  HgbA1c 5.5  Heparin subq for VTE prophylaxis  NPO  aspirin 81 mg daily prior to admission, now on No antithrombotic given significant hemorrhagic conversion on MRI.   Ongoing aggressive stroke risk factor management  Therapy recommendations:  Pending   Disposition:  Pending, discussed with daughter about GOC, she is going to discuss with family  Afib  Home meds including cardizem  EKG on admission confirmed afib  On ASA at home  Likely the cause of current stroke  Rate under control  No antiplatelet for now due to significant hemorrhagic conversion  CHF  TTE pending  Has metolazone PTA  Daughter confirmed fluid overload in the past  CCM on board  Hypertension . Stable . Permissive hypertension (OK if <180/105) for 24-48 hours post stroke and then gradually normalized within 5-7 days.  Long term BP goal normotensive  Hyperlipidemia  Home meds:  none   LDL 111, goal < 70  Now on lipitor 20  Continue statin at discharge  Other Stroke Risk Factors  Advanced age  Hx of stroke on image - left PCA old infarct on CT and MRI  Other Active Problems  CKD stage II - Cre 1.23  Leukocytosis - WBC 14.2  Hospital day # 1  This patient is critically ill due to left MCA large infarct, afib not on AC, hemorrhagic conversion, CHF and at significant risk of neurological worsening, death form recurrent stroke, cerebral edema, brain herniation, heart failure, seizure. This patient's care requires constant monitoring of vital signs, hemodynamics, respiratory and cardiac monitoring, review of multiple databases, neurological assessment, discussion with family, other specialists and medical decision making of high complexity. I spent 45 minutes of neurocritical care time in the care of this patient. I had long discussion with daughter at bedside, updated pt current condition, treatment plan and potential prognosis. She expressed understanding and  appreciation. She will discuss with family regarding GOC. I also discussed with Dr. Kendrick FriesMcQuaid.   Marvel PlanJindong Avion Kutzer, MD PhD Stroke Neurology 01/28/2018 12:31 PM    To contact Stroke Continuity provider, please refer to [email or web address]. After hours, contact General Neurology

## 2018-01-28 NOTE — Progress Notes (Signed)
Pt transported to and from MRI on vent.  Pt tolerated well.  RT will continue to monitor.

## 2018-01-29 ENCOUNTER — Inpatient Hospital Stay (HOSPITAL_COMMUNITY): Payer: Medicare Other

## 2018-01-29 DIAGNOSIS — I482 Chronic atrial fibrillation, unspecified: Secondary | ICD-10-CM

## 2018-01-29 LAB — BASIC METABOLIC PANEL
Anion gap: 13 (ref 5–15)
BUN: 36 mg/dL — ABNORMAL HIGH (ref 8–23)
CO2: 22 mmol/L (ref 22–32)
Calcium: 8.2 mg/dL — ABNORMAL LOW (ref 8.9–10.3)
Chloride: 102 mmol/L (ref 98–111)
Creatinine, Ser: 1.14 mg/dL — ABNORMAL HIGH (ref 0.44–1.00)
GFR calc Af Amer: 50 mL/min — ABNORMAL LOW (ref >60)
GFR calc non Af Amer: 44 mL/min — ABNORMAL LOW (ref >60)
Glucose, Bld: 128 mg/dL — ABNORMAL HIGH (ref 70–99)
Potassium: 2.8 mmol/L — ABNORMAL LOW (ref 3.5–5.1)
SODIUM: 137 mmol/L (ref 135–145)

## 2018-01-29 LAB — CBC
HCT: 39.4 % (ref 36.0–46.0)
Hemoglobin: 13 g/dL (ref 12.0–15.0)
MCH: 28.9 pg (ref 26.0–34.0)
MCHC: 33 g/dL (ref 30.0–36.0)
MCV: 87.6 fL (ref 80.0–100.0)
Platelets: 244 10*3/uL (ref 150–400)
RBC: 4.5 MIL/uL (ref 3.87–5.11)
RDW: 16.2 % — ABNORMAL HIGH (ref 11.5–15.5)
WBC: 15.1 10*3/uL — AB (ref 4.0–10.5)
nRBC: 0 % (ref 0.0–0.2)

## 2018-01-29 LAB — GLUCOSE, CAPILLARY
Glucose-Capillary: 133 mg/dL — ABNORMAL HIGH (ref 70–99)
Glucose-Capillary: 143 mg/dL — ABNORMAL HIGH (ref 70–99)
Glucose-Capillary: 134 mg/dL — ABNORMAL HIGH (ref 70–99)
Glucose-Capillary: 158 mg/dL — ABNORMAL HIGH (ref 70–99)
Glucose-Capillary: 146 mg/dL — ABNORMAL HIGH (ref 70–99)
Glucose-Capillary: 166 mg/dL — ABNORMAL HIGH (ref 70–99)

## 2018-01-29 LAB — PHOSPHORUS
PHOSPHORUS: 3.4 mg/dL (ref 2.5–4.6)
Phosphorus: 4.3 mg/dL (ref 2.5–4.6)

## 2018-01-29 LAB — MAGNESIUM
MAGNESIUM: 2.1 mg/dL (ref 1.7–2.4)
MAGNESIUM: 2.1 mg/dL (ref 1.7–2.4)

## 2018-01-29 MED ORDER — POTASSIUM CHLORIDE 20 MEQ/15ML (10%) PO SOLN
40.0000 meq | ORAL | Status: AC
  Administered 2018-01-29 – 2018-01-30 (×2): 40 meq
  Filled 2018-01-29 (×2): qty 30

## 2018-01-29 MED ORDER — VITAL HIGH PROTEIN PO LIQD
1000.0000 mL | ORAL | Status: DC
  Administered 2018-01-29 – 2018-02-14 (×19): 1000 mL
  Filled 2018-01-29: qty 1000

## 2018-01-29 MED ORDER — FENTANYL CITRATE (PF) 100 MCG/2ML IJ SOLN
50.0000 ug | INTRAMUSCULAR | Status: DC | PRN
  Administered 2018-01-30 – 2018-02-12 (×8): 50 ug via INTRAVENOUS
  Administered 2018-02-12: 100 ug via INTRAVENOUS
  Administered 2018-02-12 – 2018-02-15 (×4): 50 ug via INTRAVENOUS
  Filled 2018-01-29 (×10): qty 2

## 2018-01-29 MED ORDER — SODIUM CHLORIDE 0.9 % IV SOLN
250.0000 mL | INTRAVENOUS | Status: DC
  Administered 2018-01-29: 250 mL via INTRAVENOUS

## 2018-01-29 MED ORDER — PHENYLEPHRINE HCL-NACL 10-0.9 MG/250ML-% IV SOLN
0.0000 ug/min | INTRAVENOUS | Status: DC
  Administered 2018-01-29: 10 ug/min via INTRAVENOUS
  Administered 2018-01-29: 20 ug/min via INTRAVENOUS
  Filled 2018-01-29 (×2): qty 250

## 2018-01-29 MED ORDER — FENTANYL CITRATE (PF) 100 MCG/2ML IJ SOLN
50.0000 ug | INTRAMUSCULAR | Status: DC | PRN
  Filled 2018-01-29 (×2): qty 2

## 2018-01-29 MED ORDER — FENTANYL CITRATE (PF) 100 MCG/2ML IJ SOLN: 25.0000 ug | INTRAMUSCULAR | Status: DC | PRN

## 2018-01-29 NOTE — Progress Notes (Signed)
eLink Physician-Brief Progress Note Patient Name: Arleigh Derks DOB: 1929/09/02 MRN: [id number]   Date of Service  01/29/2018  HPI/Events of Note  K+ 2.8  eICU Interventions  KCL 40 meq via NG tube Q 4 hours x 2 doses        Thomasene Lot Donnamae Muilenburg 01/29/2018, 8:42 PM

## 2018-01-29 NOTE — Progress Notes (Signed)
Initial Nutrition Assessment  DOCUMENTATION CODES:   Not applicable  INTERVENTION:   Increase Vital High Protein to 50 ml/hr (1200 ml/day) D/C Prostat  Provides: 1440 kcal, 90 grams protein, and 973 ml free water.    NUTRITION DIAGNOSIS:   Inadequate oral intake related to inability to eat as evidenced by NPO status.  GOAL:   Patient will meet greater than or equal to 90% of their needs  MONITOR:   Vent status, TF tolerance  REASON FOR ASSESSMENT:   Consult Enteral/tube feeding initiation and management  ASSESSMENT:   Pt is spanish speaking only with PMH of suspected HTN, CHF, afib admitted with left large MCA stroke with hemorrhagic conversion.    Pt discussed during ICU rounds and with RN.  Per daughter pt with a good appetite PTA. She has recently lost from 170 lb to 150 lb by changing eating habits.   Patient is currently intubated on ventilator support MV: 9.4 L/min Temp (24hrs), Avg:98.7 F (37.1 C), Min:98.1 F (36.7 C), Max:99.1 F (37.3 C)  Medications reviewed and include:  Neo @ 5 mcg Labs reviewed   TF: Vital High Protein @ 40 ml/hr and 30 ml Prostat BID Provides: 1160 kcal, 114 grams protein   NUTRITION - FOCUSED PHYSICAL EXAM:    Most Recent Value  Orbital Region  No depletion  Upper Arm Region  No depletion  Thoracic and Lumbar Region  No depletion  Buccal Region  Unable to assess  Temple Region  Mild depletion  Clavicle Bone Region  No depletion  Clavicle and Acromion Bone Region  Mild depletion  Scapular Bone Region  Unable to assess  Dorsal Hand  Unable to assess [edema]  Patellar Region  No depletion  Anterior Thigh Region  No depletion  Posterior Calf Region  No depletion  Edema (RD Assessment)  Moderate [BUE]  Hair  Reviewed  Eyes  Unable to assess  Mouth  Unable to assess  Skin  Reviewed  Nails  Reviewed       Diet Order:   Diet Order            Diet NPO time specified  Diet effective now               EDUCATION NEEDS:   No education needs have been identified at this time  Skin:  Skin Assessment: Reviewed RN Assessment  Last BM:  unknown  Height:   Ht Readings from Last 1 Encounters:  01/27/18 5\' 3"  (1.6 m)    Weight:   Wt Readings from Last 1 Encounters:  01/29/18 71 kg    Ideal Body Weight:  52.2 kg  BMI:  Body mass index is 27.73 kg/m.  Estimated Nutritional Needs:   Kcal:  1389  Protein:  85-95 grams  Fluid:  > 1.5 L/day  Kendell Bane RD, LDN, CNSC [phone] Pager [phone] After Hours Pager

## 2018-01-29 NOTE — Progress Notes (Signed)
STROKE TEAM PROGRESS NOTE   SUBJECTIVE (INTERVAL HISTORY) Her daughter is at the bedside.  Overall her condition is stable.  She is off sedation this morning, still not tolerating weaning trials.  Currently on ventilation.  Slightly more responsive than yesterday, able to open eyes with stimulation but still not following commands.  OBJECTIVE Temp:  [98.1 F (36.7 C)-99.1 F (37.3 C)] 99.1 F (37.3 C) (01/28 1200) Pulse Rate:  [31-127] 84 (01/28 1200) Cardiac Rhythm: Atrial fibrillation (01/28 0800) Resp:  [18-24] 20 (01/28 1200) BP: (84-157)/(40-133) 141/77 (01/28 1200) SpO2:  [88 %-100 %] 98 % (01/28 1200) FiO2 (%):  [40 %] 40 % (01/28 1140) Weight:  [71 kg] 71 kg (01/28 0308)  Recent Labs  Lab 01/28/18 1954 01/28/18 2321 01/29/18 0539 01/29/18 0755 01/29/18 1146  GLUCAP 107* 165* 133* 143* 134*   Recent Labs  Lab 01/27/18 1553 01/27/18 1605 01/28/18 0639 01/28/18 1449 01/29/18 0454  NA 138  --  136  --   --   K 3.6  --  3.7  --   --   CL 102  --  97*  --   --   CO2 22  --  23  --   --   GLUCOSE 120*  --  87  --   --   BUN 23  --  23  --   --   CREATININE 1.19* 1.00 1.23*  --   --   CALCIUM 9.3  --  9.1  --   --   MG  --   --  2.0 2.0 2.1  PHOS  --   --  3.5 4.0 4.3   Recent Labs  Lab 01/27/18 1553 01/28/18 0639  AST 66* 74*  ALT 38 37  ALKPHOS 66 63  BILITOT 1.1 1.7*  PROT 7.6 7.6  ALBUMIN 3.6 3.4*   Recent Labs  Lab 01/27/18 1553 01/28/18 0639  WBC 13.2* 14.2*  NEUTROABS 10.9*  --   HGB 13.4 14.5  HCT 43.7 44.6  MCV 92.4 88.1  PLT 244 240   No results for input(s): CKTOTAL, CKMB, CKMBINDEX, TROPONINI in the last 168 hours. Recent Labs    01/27/18 1553  LABPROT 14.3  INR 1.12   No results for input(s): COLORURINE, LABSPEC, PHURINE, GLUCOSEU, HGBUR, BILIRUBINUR, KETONESUR, PROTEINUR, UROBILINOGEN, NITRITE, LEUKOCYTESUR in the last 72 hours.  Invalid input(s): APPERANCEUR     Component Value Date/Time   CHOL 168 01/28/2018 0639   TRIG  75 01/28/2018 0639   HDL 42 01/28/2018 0639   CHOLHDL 4.0 01/28/2018 0639   VLDL 15 01/28/2018 0639   LDLCALC 111 (H) 01/28/2018 0639   Lab Results  Component Value Date   HGBA1C 5.5 01/28/2018   No results found for: LABOPIA, COCAINSCRNUR, LABBENZ, AMPHETMU, THCU, LABBARB  No results for input(s): ETH in the last 168 hours.  I have personally reviewed the radiological images below and agree with the radiology interpretations.  Ct Angio Head W Or Wo Contrast  Result Date: 01/27/2018 CLINICAL DATA:  Code stroke. Found on the ground. Right-sided weakness. EXAM: CT ANGIOGRAPHY HEAD AND NECK TECHNIQUE: Multidetector CT imaging of the head and neck was performed using the standard protocol during bolus administration of intravenous contrast. Multiplanar CT image reconstructions and MIPs were obtained to evaluate the vascular anatomy. Carotid stenosis measurements (when applicable) are obtained utilizing NASCET criteria, using the distal internal carotid diameter as the denominator. CONTRAST:  68mL ISOVUE-370 IOPAMIDOL (ISOVUE-370) INJECTION 76% COMPARISON:  CT earlier same day FINDINGS:  CTA NECK FINDINGS Aortic arch: Aortic atherosclerosis with considerable luminal irregularity. Branching pattern of great toe cephalic vessels from the arch is normal. No origin stenosis greater than 30%. Right carotid system: Right common carotid artery is tortuous but widely patent to the bifurcation. Carotid bifurcation is free of atherosclerotic plaque or narrowing. Right cervical ICA is widely patent though tortuous. Left carotid system: Common carotid artery is tortuous but widely patent to the bifurcation. Carotid bifurcation shows minimal calcified plaque at the proximal ICA but there is no stenosis. Cervical ICA shows focal narrowing with wall thickening and irregularity in its midportion with diameter of 2.6 mm. This is a stenosis of about 50%. The wall irregularity presumably represents plaque/thrombus and could  be a source of embolus. Vertebral arteries: There is atherosclerotic plaque at both vertebral artery origins but no stenosis greater than 30%. Beyond that, both vertebral arteries are widely patent through the cervical region to the foramen magnum. Skeleton: Ordinary cervical spondylosis. Upper thoracic congenital failure of separation. Other neck: No mass or lymphadenopathy. Upper chest: Right pleural effusion. Pulmonary scarring. Question interstitial edema. Review of the MIP images confirms the above findings CTA HEAD FINDINGS Anterior circulation: Both internal carotid arteries are patent through the siphon region. On the right, there is atherosclerotic calcification but no stenosis. The anterior and middle cerebral vessels are patent. On the left, there is complete occlusion of the left M1 segment due to an embolus. Flow is present within the left anterior cerebral artery. There is poor collateral demonstration, though the contrast phase is quite early. Posterior circulation: Both vertebral arteries are patent to the basilar. Early phase contrast enhancement makes it difficult to accurately evaluate the distal vertebral arteries but I think both are sufficiently patent to the basilar. No basilar stenosis is seen. Superior cerebellar and posterior cerebral vessels are patent, though there distal branches are not yet opacified. Venous sinuses: Too early to assess venous sinuses. Anatomic variants: None other significant. Delayed phase: Not performed. Review of the MIP images confirms the above findings IMPRESSION: 1. Complete occlusion of the left M1 segment due to an embolus. Flow is present within the left anterior cerebral artery. 2. Atherosclerotic disease at both carotid bifurcations but no stenosis. 3. Focal narrowing and irregularity of the mid cervical ICA on the left with diameter of 2.6 mm. This probably represents ulcerated soft plaque and this could be a source of embolus. 4. These results were  communicated to Dr. Lindzen at 4:50 pmon 1/26/2020by text page via the [employer] messaging system. Electronically Signed   By: Mark  Shogry M.D.   On: 01/27/2018 16:54   Ct Angio Neck W Or Wo Contrast  Result Date: 01/27/2018 CLINICAL DATA:  Code stroke. Found on the ground. Right-sided weakness. EXAM: CT ANGIOGRAPHY HEAD AND NECK TECHNIQUE: Multidetector CT imaging of the head and neck was performed using the standard protocol during bolus administration of intravenous contrast. Multiplanar CT image reconstructions and MIPs were obtained to evaluate the vascular anatomy. Carotid stenosis measurements (when applicable) are obtained utilizing NASCET criteria, using the distal internal carotid diameter as the denominator. CONTRAST:  [id number] IOPAMIDOL (ISOVUE-370) INJECTION 76% COMPARISON:  CT earlier same day FINDINGS: CTA NECK FINDINGS Aortic arch: Aortic atherosclerosis with considerable luminal irregularity. Branching pattern of great toe cephalic vessels from the arch is normal. No origin stenosis greater than 30%. Right carotid system: Right common carotid artery is tortuous but widely patent to the bifurcation. Carotid bifurcation is free of atherosclerotic plaque or narrowing. Right cervical ICA  is widely patent though tortuous. Left carotid system: Common carotid artery is tortuous but widely patent to the bifurcation. Carotid bifurcation shows minimal calcified plaque at the proximal ICA but there is no stenosis. Cervical ICA shows focal narrowing with wall thickening and irregularity in its midportion with diameter of 2.6 mm. This is a stenosis of about 50%. The wall irregularity presumably represents plaque/thrombus and could be a source of embolus. Vertebral arteries: There is atherosclerotic plaque at both vertebral artery origins but no stenosis greater than 30%. Beyond that, both vertebral arteries are widely patent through the cervical region to the foramen magnum. Skeleton: Ordinary cervical  spondylosis. Upper thoracic congenital failure of separation. Other neck: No mass or lymphadenopathy. Upper chest: Right pleural effusion. Pulmonary scarring. Question interstitial edema. Review of the MIP images confirms the above findings CTA HEAD FINDINGS Anterior circulation: Both internal carotid arteries are patent through the siphon region. On the right, there is atherosclerotic calcification but no stenosis. The anterior and middle cerebral vessels are patent. On the left, there is complete occlusion of the left M1 segment due to an embolus. Flow is present within the left anterior cerebral artery. There is poor collateral demonstration, though the contrast phase is quite early. Posterior circulation: Both vertebral arteries are patent to the basilar. Early phase contrast enhancement makes it difficult to accurately evaluate the distal vertebral arteries but I think both are sufficiently patent to the basilar. No basilar stenosis is seen. Superior cerebellar and posterior cerebral vessels are patent, though there distal branches are not yet opacified. Venous sinuses: Too early to assess venous sinuses. Anatomic variants: None other significant. Delayed phase: Not performed. Review of the MIP images confirms the above findings IMPRESSION: 1. Complete occlusion of the left M1 segment due to an embolus. Flow is present within the left anterior cerebral artery. 2. Atherosclerotic disease at both carotid bifurcations but no stenosis. 3. Focal narrowing and irregularity of the mid cervical ICA on the left with diameter of 2.6 mm. This probably represents ulcerated soft plaque and this could be a source of embolus. 4. These results were communicated to Dr. Otelia LimesLindzen at 4:50 pmon 1/26/2020by text page via the [facility]. Electronically Signed   By: Paulina FusiMark  Shogry M.D.   On: 01/27/2018 16:54   Ct Cervical Spine Wo Contrast  Result Date: 01/27/2018 CLINICAL DATA:  Found down. Stroke. Clinical concern for  cervical spine fracture. EXAM: CT CERVICAL SPINE WITHOUT CONTRAST TECHNIQUE: Multidetector CT imaging of the cervical spine was performed without intravenous contrast. Multiplanar CT image reconstructions were also generated. COMPARISON:  Head CT obtained at the same time. FINDINGS: Alignment: Minimal reversal of the normal lordosis. No subluxations. Skull base and vertebrae: No acute fracture. No primary bone lesion or focal pathologic process. Soft tissues and spinal canal: No prevertebral fluid or swelling. No visible canal hematoma. Disc levels:  Multilevel degenerative changes. Upper chest: Minimal biapical bullous changes. Small to moderate-sized right pleural effusion. Other: Endotracheal and orogastric tubes are in place. Small right lobe thyroid nodule measuring 6 mm in maximum diameter. Minimal carotid artery calcification on the left. IMPRESSION: 1. No fracture or subluxation. 2. Minimal reversal of the normal lordosis. 3. Multilevel degenerative changes. 4. Small to moderate-sized right pleural effusion. 5. Minimal left carotid artery atheromatous calcification. 6. 6 mm right lobe thyroid nodule. This is too small to characterize, but most likely benign in the absence of known clinical risk factors for thyroid carcinoma. Electronically Signed   By: Zada FindersSteven  Reid M.D.  On: 01/27/2018 16:46   Mr Brain Wo Contrast  Result Date: 01/28/2018 CLINICAL DATA:  Found unconscious at home.  MCA occlusion. EXAM: MRI HEAD WITHOUT CONTRAST TECHNIQUE: Multiplanar, multiecho pulse sequences of the brain and surrounding structures were obtained without intravenous contrast. COMPARISON:  CTA head neck 01/27/2018 FINDINGS: BRAIN: Large area of ischemic infarct within the left MCA territory, involving most of the frontal operculum and insula, as well as parts of the left basal ganglia and left temporal lobe. The midline structures are normal. There is moderate edema throughout the ischemic territory. Early confluent  hyperintense T2-weighted signal of the periventricular and deep white matter, most commonly due to chronic ischemic microangiopathy. There is petechial hemorrhage throughout the infarcted area. There is laminar necrosis at a site of remote infarct in the left occipital lobe. VASCULAR: Major intracranial arterial and venous sinus flow voids are normal. SKULL AND UPPER CERVICAL SPINE: Right frontal scalp hematoma. SINUSES/ORBITS: No fluid levels or advanced mucosal thickening. No mastoid or middle ear effusion. The orbits are normal. IMPRESSION: 1. Large ischemic infarct within the left MCA territory with confluent petechial hemorrhage throughout the affected area Heidelberg classification 1b: HI2, confluent petechiae, no mass effect. 2. No midline shift or hydrocephalus. Electronically Signed   By: Deatra Robinson M.D.   On: 01/28/2018 04:50   Ct Head Code Stroke Wo Contrast  Result Date: 01/27/2018 CLINICAL DATA:  Code stroke. Last seen normal 2100 hours yesterday. Flaccid on the right side. EXAM: CT HEAD WITHOUT CONTRAST TECHNIQUE: Contiguous axial images were obtained from the base of the skull through the vertex without intravenous contrast. COMPARISON:  None. FINDINGS: Brain: Brainstem and cerebellum are unremarkable except for chronic small-vessel ischemic changes of the pons. Old infarction in the left occipital lobe. Acute infarction in the left hemisphere in the MCA territory affecting the insula, deep white matter and cortical brain in 3 middle cerebral artery sectors. No evidence of hemorrhage. No mass effect or shift. Chronic small-vessel ischemic changes affect the white matter elsewhere. Vascular: Hyperdense left MCA in the distal M1/bifurcation region. Skull: Negative Sinuses/Orbits: Clear/normal Other: None ASPECTS (Alberta Stroke Program Early CT Score) - Ganglionic level infarction (caudate, lentiform nuclei, internal capsule, insula, M1-M3 cortex): 4 - Supraganglionic infarction (M4-M6 cortex): 2  Total score (0-10 with 10 being normal): 6 IMPRESSION: 1. Acute infarction in the left MCA territory affecting the insula, deep white matter and cortical brain in 3 middle cerebral artery sectors. No hemorrhage or mass effect. 2. ASPECTS is 6. 3. Hyperdense left MCA. *This report was discussed by telephone with Dr. Otelia Limes at 1621 hours. Electronically Signed   By: Paulina Fusi M.D.   On: 01/27/2018 16:31    PHYSICAL EXAM  Temp:  [98.1 F (36.7 C)-99.1 F (37.3 C)] 99.1 F (37.3 C) (01/28 1200) Pulse Rate:  [31-127] 84 (01/28 1200) Resp:  [18-24] 20 (01/28 1200) BP: (84-157)/(40-133) 141/77 (01/28 1200) SpO2:  [88 %-100 %] 98 % (01/28 1200) FiO2 (%):  [40 %] 40 % (01/28 1140) Weight:  [71 kg] 71 kg (01/28 0308)  General - Well nourished, well developed, intubated off sedation  Ophthalmologic - fundi not visualized due to noncooperation.  Cardiovascular - irregularly irregular heart rate and rhythm.  Neuro - intubated off sedation, eyes open with pain or voice, seems able to follow commands on eye closed and opening, however not following other commands. Eyes in left gaze position, not blinking to visual threat bilaterally, doll's eyes present, not tracking, PERRL. Corneal reflex present on the left  but absent on the right, gag and cough present. Breathing over the vent.  Facial symmetry not able to test due to ET tube.  Tongue midline in mouth. On pain stimulation, slight withdraw of RUE and RLE, mild withdraw of LLE and against gravity on LUE. DTR 1+ and positive babinski on the right. Sensation, coordination and gait not tested.   ASSESSMENT/PLAN Ms. Kelli Vazquez is a 85 y.o. female spanish speaking only with no documented history but seems to have afib and CHF and HTN admitted for found down at home, unresponsive, with right side flaccid. Intubated in ED. No tPA given due to outside window and established infarct.   Stroke:  left large MCA infarct embolic pattern secondary to afib not  on AC  CT head - old left PCA infarct, acute left MCA large infarct  MRI  Left large MCA infarct with hemorrhagic conversion, old left PCA infarct  CTA head and neck - left M1 occlusion  2D Echo EF 55 to 60%  CT repeat 01/29/18 increased mass effect with MLS 12mm and hemorrhagic conversion unchanged  LDL 111  HgbA1c 5.5  Heparin subq for VTE prophylaxis  NPO  aspirin 81 mg daily prior to admission, now on No antithrombotic given significant hemorrhagic conversion.  Ongoing aggressive stroke risk factor management  Therapy recommendations:  Pending   Disposition:  Pending, discussed with daughters about GOC, they would like to continue current management  Afib  Home meds including cardizem  EKG on admission confirmed afib  On ASA at home  Likely the cause of current stroke  Rate under control  No antiplatelet for now due to significant hemorrhagic conversion  CHF  TTE EF 55 to 60%  Has metolazone PTA  Daughter confirmed fluid overload in the past  CCM on board  Respiratory failure  Intubated off sedation  CCM on board  Failed weaning trials x2  We will try weaning trials again this afternoon  Extubate as able  Hypertension . Stable  BP goal < 160 due to hemorrhagic conversion  Off Neo-Synephrine  Hyperlipidemia  Home meds:  none   LDL 111, goal < 70  Now on lipitor 20  Continue statin at discharge  Other Stroke Risk Factors  Advanced age  Hx of stroke on image - left PCA old infarct on CT and MRI  Other Active Problems  CKD stage II - Cre 1.23  Leukocytosis - WBC 14.2  Hospital day # 2  This patient is critically ill due to left MCA large infarct, afib not on AC, hemorrhagic conversion, CHF and at significant risk of neurological worsening, death form recurrent stroke, cerebral edema, brain herniation, heart failure, seizure. This patient's care requires constant monitoring of vital signs, hemodynamics, respiratory and  cardiac monitoring, review of multiple databases, neurological assessment, discussion with family, other specialists and medical decision making of high complexity. I spent 45 minutes of neurocritical care time in the care of this patient. I had long discussion with daughter at bedside, updated pt current condition, treatment plan and potential prognosis. She expressed understanding and appreciation.    Marvel Plan, MD PhD Stroke Neurology 01/29/2018 1:14 PM    To contact Stroke Continuity provider, please refer to [email or web address]. After hours, contact General Neurology

## 2018-01-29 NOTE — Progress Notes (Signed)
NAME:  Kelli Vazquez, MRN:  [id number], DOB:  1929/05/20, LOS: 2 ADMISSION DATE:  01/27/2018, CONSULTATION DATE:  1/26  REFERRING MD:  Mesner, CHIEF COMPLAINT:  Acute encephalopathy   Brief History   85 y/o female admitted on 1/26 with embolic left MCA stroke.   Past Medical History  HTN, CHF  Significant Hospital Events   Admission/intubation 1/26  Consults:  PCCM Neurology  Procedures:  none  Significant Diagnostic Tests:  CTA 1/26: 1. Complete occlusion of the left M1 segment due to an embolus. Focal narrowing and irregularity of the mid cervical ICA on the left with diameter of 2.6 mm. This probably represents ulcerated soft plaque and this could be a source of embolus. MRI brain 1/27> large ischemic left MCA stroke, no midline shift CT head 1/28> evolving L MCA infarct with new 3mm midline shift  Micro Data:    Antimicrobials:     Interim history/subjective:  Hypotension overnight Started on neosynephrine Mildly worsening renal failure  Objective   Blood pressure 132/60, pulse 79, temperature 98.4 F (36.9 C), temperature source Axillary, resp. rate 20, height 5\' 3"  (1.6 m), weight 71 kg, SpO2 100 %.    Vent Mode: PRVC FiO2 (%):  [40 %] 40 % Set Rate:  [20 bmp] 20 bmp Vt Set:  [460 mL] 460 mL PEEP:  [5 cmH20] 5 cmH20 Plateau Pressure:  [18 cmH20-22 cmH20] 20 cmH20   Intake/Output Summary (Last 24 hours) at 01/29/2018 0851 Last data filed at 01/29/2018 0800 Gross per 24 hour  Intake 888.91 ml  Output 1100 ml  Net -211.09 ml   Filed Weights   01/27/18 1724 01/29/18 0308  Weight: 68 kg 71 kg    Examination:  General:  In bed on vent HENT: NCAT ETT in place PULM: CTA B, vent supported breathing CV: RRR, no mgr GI: BS+, soft, nontender MSK: normal bulk and tone Neuro: sedated on vent some eye opening to voice, some arm movement on left, but nothing on command      Resolved Hospital Problem list     Assessment & Plan:  Acute respiratory  failure with hypoxemia Full mechanical vent support VAP prevention Daily WUA/SBT Consider extubation if mental status improves  Acute large left MCA stroke: some midline shift, prognosis poor > consider hypertonic saline > will need PICC if hypertonic  Hypotension due to propofol > stop propofol > wean off phenylephrine  Atrial fibrillation > Tele  Mild AKI > continue tube feeding > Monitor BMET and UOP > Replace electrolytes as needed   Best practice:  Diet: start tube feeding Pain/Anxiety/Delirium protocol (if indicated): yes PAD protocol RASS goal 0 to -1 VAP protocol (if indicated): yes DVT prophylaxis: Sub q hep GI prophylaxis: pantoprazole Glucose control: SSI Mobility: bed rest Code Status: full Family Communication: updated daughter bedside again on 1/28, informed her that her prognosis is poor. She is still hoping for a recovery despite our multiple conversations with her. Disposition: remain in ICU  Labs   CBC: Recent Labs  Lab 01/27/18 1553 01/28/18 0639  WBC 13.2* 14.2*  NEUTROABS 10.9*  --   HGB 13.4 14.5  HCT 43.7 44.6  MCV 92.4 88.1  PLT 244 240    Basic Metabolic Panel: Recent Labs  Lab 01/27/18 1553 01/27/18 1605 01/28/18 0639 01/28/18 1449 01/29/18 0454  NA 138  --  136  --   --   K 3.6  --  3.7  --   --   CL 102  --  97*  --   --   CO2 22  --  23  --   --   GLUCOSE 120*  --  87  --   --   BUN 23  --  23  --   --   CREATININE 1.19* 1.00 1.23*  --   --   CALCIUM 9.3  --  9.1  --   --   MG  --   --  2.0 2.0 2.1  PHOS  --   --  3.5 4.0 4.3   GFR: Estimated Creatinine Clearance: 31 mL/min (A) (by C-G formula based on SCr of 1.23 mg/dL (H)). Recent Labs  Lab 01/27/18 1553 01/28/18 0639  WBC 13.2* 14.2*    Liver Function Tests: Recent Labs  Lab 01/27/18 1553 01/28/18 0639  AST 66* 74*  ALT 38 37  ALKPHOS 66 63  BILITOT 1.1 1.7*  PROT 7.6 7.6  ALBUMIN 3.6 3.4*   No results for input(s): LIPASE, AMYLASE in the last 168  hours. No results for input(s): AMMONIA in the last 168 hours.  ABG    Component Value Date/Time   PHART 7.583 (H) 01/28/2018 0615   PCO2ART 25.8 (L) 01/28/2018 0615   PO2ART 92.3 01/28/2018 0615   HCO3 24.4 01/28/2018 0615   O2SAT 97.6 01/28/2018 0615     Coagulation Profile: Recent Labs  Lab 01/27/18 1553  INR 1.12    Cardiac Enzymes: No results for input(s): CKTOTAL, CKMB, CKMBINDEX, TROPONINI in the last 168 hours.  HbA1C: Hgb A1c MFr Bld  Date/Time Value Ref Range Status  01/28/2018 06:39 AM 5.5 4.8 - 5.6 % Final    Comment:    (NOTE) Pre diabetes:          5.7%-6.4% Diabetes:              >6.4% Glycemic control for   <7.0% adults with diabetes     CBG: Recent Labs  Lab 01/28/18 1541 01/28/18 1954 01/28/18 2321 01/29/18 0539 01/29/18 0755  GLUCAP 90 107* 165* 133* 143*     Critical care time: 31 minutes    Heber [place], MD [facility] Pager: [phone] Cell: [phone] If no response, call [phone]

## 2018-01-29 NOTE — Progress Notes (Signed)
eLink Physician-Brief Progress Note Patient Name: Kelli Vazquez DOB: 1929/01/23 MRN: [id number]   Date of Service  01/29/2018  HPI/Events of Note  Hypotension - BP = 95/54. CXR yesterday with cardiomegaly and pulmonary congestion. No CVL or CVP. LVEF = 55-65%.  eICU Interventions  Will order: 1. Phenylephrine IV infusion. Titrate to MAP >= 65.  2. Fentanyl 25-50 mcg IV Q 2 hours PRN pain, agitation or sedation.         Sommer,Steven Eugene 01/29/2018, 1:41 AM

## 2018-01-30 DIAGNOSIS — N179 Acute kidney failure, unspecified: Secondary | ICD-10-CM

## 2018-01-30 LAB — CBC
HCT: 36.8 % (ref 36.0–46.0)
Hemoglobin: 11.8 g/dL — ABNORMAL LOW (ref 12.0–15.0)
MCH: 28.5 pg (ref 26.0–34.0)
MCHC: 32.1 g/dL (ref 30.0–36.0)
MCV: 88.9 fL (ref 80.0–100.0)
Platelets: 211 10*3/uL (ref 150–400)
RBC: 4.14 MIL/uL (ref 3.87–5.11)
RDW: 16.1 % — ABNORMAL HIGH (ref 11.5–15.5)
WBC: 14.7 10*3/uL — ABNORMAL HIGH (ref 4.0–10.5)
nRBC: 0 % (ref 0.0–0.2)

## 2018-01-30 LAB — GLUCOSE, CAPILLARY
Glucose-Capillary: 123 mg/dL — ABNORMAL HIGH (ref 70–99)
Glucose-Capillary: 128 mg/dL — ABNORMAL HIGH (ref 70–99)
Glucose-Capillary: 130 mg/dL — ABNORMAL HIGH (ref 70–99)
Glucose-Capillary: 132 mg/dL — ABNORMAL HIGH (ref 70–99)
Glucose-Capillary: 132 mg/dL — ABNORMAL HIGH (ref 70–99)
Glucose-Capillary: 148 mg/dL — ABNORMAL HIGH (ref 70–99)

## 2018-01-30 LAB — BASIC METABOLIC PANEL
Anion gap: 10 (ref 5–15)
BUN: 40 mg/dL — ABNORMAL HIGH (ref 8–23)
CO2: 24 mmol/L (ref 22–32)
Calcium: 8.2 mg/dL — ABNORMAL LOW (ref 8.9–10.3)
Chloride: 107 mmol/L (ref 98–111)
Creatinine, Ser: 1.17 mg/dL — ABNORMAL HIGH (ref 0.44–1.00)
GFR calc Af Amer: 49 mL/min — ABNORMAL LOW (ref >60)
GFR calc non Af Amer: 42 mL/min — ABNORMAL LOW (ref >60)
Glucose, Bld: 149 mg/dL — ABNORMAL HIGH (ref 70–99)
Potassium: 3.8 mmol/L (ref 3.5–5.1)
SODIUM: 141 mmol/L (ref 135–145)

## 2018-01-30 MED ORDER — SENNOSIDES-DOCUSATE SODIUM 8.6-50 MG PO TABS
1.0000 | ORAL_TABLET | Freq: Every day | ORAL | Status: DC
  Administered 2018-01-30: 1
  Filled 2018-01-30: qty 1

## 2018-01-30 MED ORDER — SODIUM CHLORIDE 0.9 % IV SOLN
250.0000 mL | INTRAVENOUS | Status: DC
  Administered 2018-01-30: 250 mL via INTRAVENOUS

## 2018-01-30 NOTE — Progress Notes (Addendum)
ABG results on G3 cartridge that will not crossover. Remaining G3 cartridges discarded per recall notice.  pH        7.51 CO2     34.1 pO2      86 HCO3   27.7

## 2018-01-30 NOTE — Progress Notes (Signed)
eLink Physician-Brief Progress Note Patient Name: Kelli Vazquez DOB: Aug 22, 1929 MRN: [id number]   Date of Service  01/30/2018  HPI/Events of Note  Urinary retention with 420 ml visible by bladder scan  eICU Interventions  In/out bladder catheterization        Migdalia Dk 01/30/2018, 6:35 AM

## 2018-01-30 NOTE — Progress Notes (Signed)
STROKE TEAM PROGRESS NOTE   SUBJECTIVE (INTERVAL HISTORY) Her daughter is at the bedside.  Overall her condition is stable.  She is on weaning trials.  eyes open but still not following commands. Daughter wants aggressive care including trach and PEG if needed.   OBJECTIVE Temp:  [98 F (36.7 C)-99.1 F (37.3 C)] 98 F (36.7 C) (01/29 0800) Pulse Rate:  [55-127] 86 (01/29 1000) Cardiac Rhythm: Atrial fibrillation (01/29 0800) Resp:  [0-30] 28 (01/29 1000) BP: (94-147)/(52-111) 123/77 (01/29 1000) SpO2:  [96 %-100 %] 99 % (01/29 1000) FiO2 (%):  [30 %-40 %] 30 % (01/29 0748) Weight:  [71 kg] 71 kg (01/29 0500)  Recent Labs  Lab 01/29/18 1526 01/29/18 1946 01/29/18 2320 01/30/18 0318 01/30/18 0735  GLUCAP 158* 146* 166* 130* 128*   Recent Labs  Lab 01/27/18 1553 01/27/18 1605 01/28/18 0639 01/28/18 1449 01/29/18 0454 01/29/18 1636 01/30/18 0543  NA 138  --  136  --   --  137 141  K 3.6  --  3.7  --   --  2.8* 3.8  CL 102  --  97*  --   --  102 107  CO2 22  --  23  --   --  22 24  GLUCOSE 120*  --  87  --   --  128* 149*  BUN 23  --  23  --   --  36* 40*  CREATININE 1.19* 1.00 1.23*  --   --  1.14* 1.17*  CALCIUM 9.3  --  9.1  --   --  8.2* 8.2*  MG  --   --  2.0 2.0 2.1 2.1  --   PHOS  --   --  3.5 4.0 4.3 3.4  --    Recent Labs  Lab 01/27/18 1553 01/28/18 0639  AST 66* 74*  ALT 38 37  ALKPHOS 66 63  BILITOT 1.1 1.7*  PROT 7.6 7.6  ALBUMIN 3.6 3.4*   Recent Labs  Lab 01/27/18 1553 01/28/18 0639 01/29/18 1636 01/30/18 0543  WBC 13.2* 14.2* 15.1* 14.7*  NEUTROABS 10.9*  --   --   --   HGB 13.4 14.5 13.0 11.8*  HCT 43.7 44.6 39.4 36.8  MCV 92.4 88.1 87.6 88.9  PLT 244 240 244 211   No results for input(s): CKTOTAL, CKMB, CKMBINDEX, TROPONINI in the last 168 hours. Recent Labs    01/27/18 1553  LABPROT 14.3  INR 1.12   No results for input(s): COLORURINE, LABSPEC, PHURINE, GLUCOSEU, HGBUR, BILIRUBINUR, KETONESUR, PROTEINUR, UROBILINOGEN,  NITRITE, LEUKOCYTESUR in the last 72 hours.  Invalid input(s): APPERANCEUR     Component Value Date/Time   CHOL 168 01/28/2018 0639   TRIG 75 01/28/2018 0639   HDL 42 01/28/2018 0639   CHOLHDL 4.0 01/28/2018 0639   VLDL 15 01/28/2018 0639   LDLCALC 111 (H) 01/28/2018 0639   Lab Results  Component Value Date   HGBA1C 5.5 01/28/2018   No results found for: LABOPIA, COCAINSCRNUR, LABBENZ, AMPHETMU, THCU, LABBARB  No results for input(s): ETH in the last 168 hours.  I have personally reviewed the radiological images below and agree with the radiology interpretations.  Ct Angio Head W Or Wo Contrast  Result Date: 01/27/2018 CLINICAL DATA:  Code stroke. Found on the ground. Right-sided weakness. EXAM: CT ANGIOGRAPHY HEAD AND NECK TECHNIQUE: Multidetector CT imaging of the head and neck was performed using the standard protocol during bolus administration of intravenous contrast. Multiplanar CT image reconstructions and MIPs  were obtained to evaluate the vascular anatomy. Carotid stenosis measurements (when applicable) are obtained utilizing NASCET criteria, using the distal internal carotid diameter as the denominator. CONTRAST:  50mL ISOVUE-370 IOPAMIDOL (ISOVUE-370) INJECTION 76% COMPARISON:  CT earlier same day FINDINGS: CTA NECK FINDINGS Aortic arch: Aortic atherosclerosis with considerable luminal irregularity. Branching pattern of great toe cephalic vessels from the arch is normal. No origin stenosis greater than 30%. Right carotid system: Right common carotid artery is tortuous but widely patent to the bifurcation. Carotid bifurcation is free of atherosclerotic plaque or narrowing. Right cervical ICA is widely patent though tortuous. Left carotid system: Common carotid artery is tortuous but widely patent to the bifurcation. Carotid bifurcation shows minimal calcified plaque at the proximal ICA but there is no stenosis. Cervical ICA shows focal narrowing with wall thickening and irregularity  in its midportion with diameter of 2.6 mm. This is a stenosis of about 50%. The wall irregularity presumably represents plaque/thrombus and could be a source of embolus. Vertebral arteries: There is atherosclerotic plaque at both vertebral artery origins but no stenosis greater than 30%. Beyond that, both vertebral arteries are widely patent through the cervical region to the foramen magnum. Skeleton: Ordinary cervical spondylosis. Upper thoracic congenital failure of separation. Other neck: No mass or lymphadenopathy. Upper chest: Right pleural effusion. Pulmonary scarring. Question interstitial edema. Review of the MIP images confirms the above findings CTA HEAD FINDINGS Anterior circulation: Both internal carotid arteries are patent through the siphon region. On the right, there is atherosclerotic calcification but no stenosis. The anterior and middle cerebral vessels are patent. On the left, there is complete occlusion of the left M1 segment due to an embolus. Flow is present within the left anterior cerebral artery. There is poor collateral demonstration, though the contrast phase is quite early. Posterior circulation: Both vertebral arteries are patent to the basilar. Early phase contrast enhancement makes it difficult to accurately evaluate the distal vertebral arteries but I think both are sufficiently patent to the basilar. No basilar stenosis is seen. Superior cerebellar and posterior cerebral vessels are patent, though there distal branches are not yet opacified. Venous sinuses: Too early to assess venous sinuses. Anatomic variants: None other significant. Delayed phase: Not performed. Review of the MIP images confirms the above findings IMPRESSION: 1. Complete occlusion of the left M1 segment due to an embolus. Flow is present within the left anterior cerebral artery. 2. Atherosclerotic disease at both carotid bifurcations but no stenosis. 3. Focal narrowing and irregularity of the mid cervical ICA on  the left with diameter of 2.6 mm. This probably represents ulcerated soft plaque and this could be a source of embolus. 4. These results were communicated to Dr. Otelia Limes at 4:50 pmon 1/26/2020by text page via the [facility] messaging system. Electronically Signed   By: Paulina Fusi M.D.   On: 01/27/2018 16:54   Ct Angio Neck W Or Wo Contrast  Result Date: 01/27/2018 CLINICAL DATA:  Code stroke. Found on the ground. Right-sided weakness. EXAM: CT ANGIOGRAPHY HEAD AND NECK TECHNIQUE: Multidetector CT imaging of the head and neck was performed using the standard protocol during bolus administration of intravenous contrast. Multiplanar CT image reconstructions and MIPs were obtained to evaluate the vascular anatomy. Carotid stenosis measurements (when applicable) are obtained utilizing NASCET criteria, using the distal internal carotid diameter as the denominator. CONTRAST:  50mL ISOVUE-370 IOPAMIDOL (ISOVUE-370) INJECTION 76% COMPARISON:  CT earlier same day FINDINGS: CTA NECK FINDINGS Aortic arch: Aortic atherosclerosis with considerable luminal irregularity. Branching pattern of great  toe cephalic vessels from the arch is normal. No origin stenosis greater than 30%. Right carotid system: Right common carotid artery is tortuous but widely patent to the bifurcation. Carotid bifurcation is free of atherosclerotic plaque or narrowing. Right cervical ICA is widely patent though tortuous. Left carotid system: Common carotid artery is tortuous but widely patent to the bifurcation. Carotid bifurcation shows minimal calcified plaque at the proximal ICA but there is no stenosis. Cervical ICA shows focal narrowing with wall thickening and irregularity in its midportion with diameter of 2.6 mm. This is a stenosis of about 50%. The wall irregularity presumably represents plaque/thrombus and could be a source of embolus. Vertebral arteries: There is atherosclerotic plaque at both vertebral artery origins but no stenosis greater  than 30%. Beyond that, both vertebral arteries are widely patent through the cervical region to the foramen magnum. Skeleton: Ordinary cervical spondylosis. Upper thoracic congenital failure of separation. Other neck: No mass or lymphadenopathy. Upper chest: Right pleural effusion. Pulmonary scarring. Question interstitial edema. Review of the MIP images confirms the above findings CTA HEAD FINDINGS Anterior circulation: Both internal carotid arteries are patent through the siphon region. On the right, there is atherosclerotic calcification but no stenosis. The anterior and middle cerebral vessels are patent. On the left, there is complete occlusion of the left M1 segment due to an embolus. Flow is present within the left anterior cerebral artery. There is poor collateral demonstration, though the contrast phase is quite early. Posterior circulation: Both vertebral arteries are patent to the basilar. Early phase contrast enhancement makes it difficult to accurately evaluate the distal vertebral arteries but I think both are sufficiently patent to the basilar. No basilar stenosis is seen. Superior cerebellar and posterior cerebral vessels are patent, though there distal branches are not yet opacified. Venous sinuses: Too early to assess venous sinuses. Anatomic variants: None other significant. Delayed phase: Not performed. Review of the MIP images confirms the above findings IMPRESSION: 1. Complete occlusion of the left M1 segment due to an embolus. Flow is present within the left anterior cerebral artery. 2. Atherosclerotic disease at both carotid bifurcations but no stenosis. 3. Focal narrowing and irregularity of the mid cervical ICA on the left with diameter of 2.6 mm. This probably represents ulcerated soft plaque and this could be a source of embolus. 4. These results were communicated to Dr. Otelia LimesLindzen at 4:50 pmon 1/26/2020by text page via the [facility] messaging system. Electronically Signed   By: Paulina FusiMark  Shogry  M.D.   On: 01/27/2018 16:54   Ct Cervical Spine Wo Contrast  Result Date: 01/27/2018 CLINICAL DATA:  Found down. Stroke. Clinical concern for cervical spine fracture. EXAM: CT CERVICAL SPINE WITHOUT CONTRAST TECHNIQUE: Multidetector CT imaging of the cervical spine was performed without intravenous contrast. Multiplanar CT image reconstructions were also generated. COMPARISON:  Head CT obtained at the same time. FINDINGS: Alignment: Minimal reversal of the normal lordosis. No subluxations. Skull base and vertebrae: No acute fracture. No primary bone lesion or focal pathologic process. Soft tissues and spinal canal: No prevertebral fluid or swelling. No visible canal hematoma. Disc levels:  Multilevel degenerative changes. Upper chest: Minimal biapical bullous changes. Small to moderate-sized right pleural effusion. Other: Endotracheal and orogastric tubes are in place. Small right lobe thyroid nodule measuring 6 mm in maximum diameter. Minimal carotid artery calcification on the left. IMPRESSION: 1. No fracture or subluxation. 2. Minimal reversal of the normal lordosis. 3. Multilevel degenerative changes. 4. Small to moderate-sized right pleural effusion. 5. Minimal left carotid  artery atheromatous calcification. 6. 6 mm right lobe thyroid nodule. This is too small to characterize, but most likely benign in the absence of known clinical risk factors for thyroid carcinoma. Electronically Signed   By: Beckie Salts M.D.   On: 01/27/2018 16:46   Mr Brain Wo Contrast  Result Date: 01/28/2018 CLINICAL DATA:  Found unconscious at home.  MCA occlusion. EXAM: MRI HEAD WITHOUT CONTRAST TECHNIQUE: Multiplanar, multiecho pulse sequences of the brain and surrounding structures were obtained without intravenous contrast. COMPARISON:  CTA head neck 01/27/2018 FINDINGS: BRAIN: Large area of ischemic infarct within the left MCA territory, involving most of the frontal operculum and insula, as well as parts of the left basal  ganglia and left temporal lobe. The midline structures are normal. There is moderate edema throughout the ischemic territory. Early confluent hyperintense T2-weighted signal of the periventricular and deep white matter, most commonly due to chronic ischemic microangiopathy. There is petechial hemorrhage throughout the infarcted area. There is laminar necrosis at a site of remote infarct in the left occipital lobe. VASCULAR: Major intracranial arterial and venous sinus flow voids are normal. SKULL AND UPPER CERVICAL SPINE: Right frontal scalp hematoma. SINUSES/ORBITS: No fluid levels or advanced mucosal thickening. No mastoid or middle ear effusion. The orbits are normal. IMPRESSION: 1. Large ischemic infarct within the left MCA territory with confluent petechial hemorrhage throughout the affected area Heidelberg classification 1b: HI2, confluent petechiae, no mass effect. 2. No midline shift or hydrocephalus. Electronically Signed   By: Deatra Robinson M.D.   On: 01/28/2018 04:50   Ct Head Code Stroke Wo Contrast  Result Date: 01/27/2018 CLINICAL DATA:  Code stroke. Last seen normal 2100 hours yesterday. Flaccid on the right side. EXAM: CT HEAD WITHOUT CONTRAST TECHNIQUE: Contiguous axial images were obtained from the base of the skull through the vertex without intravenous contrast. COMPARISON:  None. FINDINGS: Brain: Brainstem and cerebellum are unremarkable except for chronic small-vessel ischemic changes of the pons. Old infarction in the left occipital lobe. Acute infarction in the left hemisphere in the MCA territory affecting the insula, deep white matter and cortical brain in 3 middle cerebral artery sectors. No evidence of hemorrhage. No mass effect or shift. Chronic small-vessel ischemic changes affect the white matter elsewhere. Vascular: Hyperdense left MCA in the distal M1/bifurcation region. Skull: Negative Sinuses/Orbits: Clear/normal Other: None ASPECTS (Alberta Stroke Program Early CT Score) -  Ganglionic level infarction (caudate, lentiform nuclei, internal capsule, insula, M1-M3 cortex): 4 - Supraganglionic infarction (M4-M6 cortex): 2 Total score (0-10 with 10 being normal): 6 IMPRESSION: 1. Acute infarction in the left MCA territory affecting the insula, deep white matter and cortical brain in 3 middle cerebral artery sectors. No hemorrhage or mass effect. 2. ASPECTS is 6. 3. Hyperdense left MCA. *This report was discussed by telephone with Dr. Otelia Limes at 1621 hours. Electronically Signed   By: Paulina Fusi M.D.   On: 01/27/2018 16:31    PHYSICAL EXAM  Temp:  [98 F (36.7 C)-99.1 F (37.3 C)] 98 F (36.7 C) (01/29 0800) Pulse Rate:  [55-127] 86 (01/29 1000) Resp:  [0-30] 28 (01/29 1000) BP: (94-147)/(52-111) 123/77 (01/29 1000) SpO2:  [96 %-100 %] 99 % (01/29 1000) FiO2 (%):  [30 %-40 %] 30 % (01/29 0748) Weight:  [71 kg] 71 kg (01/29 0500)  General - Well nourished, well developed, intubated off sedation  Ophthalmologic - fundi not visualized due to noncooperation.  Cardiovascular - irregularly irregular heart rate and rhythm.  Neuro - intubated off sedation, eyes  open, seems able to follow commands on eye closed and opening, however not following any other commands. Eyes in left gaze position, not consistent blinking to visual threat bilaterally, doll's eyes present, not tracking, PERRL. Corneal reflex present bilaterally, gag and cough present. Breathing over the vent.  Facial symmetry not able to test due to ET tube.  Tongue midline in mouth. On pain stimulation, slight withdraw of RUE and RLE, left arm can hold against gravity, LLE mild withdraw but able to wiggle toes spontaneously. DTR 1+ and positive babinski on the right. Sensation, coordination and gait not tested.   ASSESSMENT/PLAN Ms. Kelli Vazquez is a 85 y.o. female spanish speaking only with no documented history but seems to have afib and CHF and HTN admitted for found down at home, unresponsive, with right side  flaccid. Intubated in ED. No tPA given due to outside window and established infarct.   Stroke:  left large MCA infarct embolic pattern secondary to afib not on AC  CT head - old left PCA infarct, acute left MCA large infarct  MRI  Left large MCA infarct with hemorrhagic conversion, old left PCA infarct  CTA head and neck - left M1 occlusion  2D Echo EF 55 to 60%  CT repeat 01/29/18 increased mass effect with MLS 66mm and hemorrhagic conversion unchanged  LDL 111  HgbA1c 5.5  Heparin subq for VTE prophylaxis  NPO  aspirin 81 mg daily prior to admission, now on No antithrombotic given significant hemorrhagic conversion.  Ongoing aggressive stroke risk factor management  Therapy recommendations:  Pending   Disposition:  Pending, discussed with daughters about GOC, they would like aggressive care including trach PEG NH placement if needed  Afib  Home meds including cardizem  EKG on admission confirmed afib  On ASA at home  Likely the cause of current stroke  Rate under control  No antiplatelet for now due to significant hemorrhagic conversion  CHF  TTE EF 55 to 60%  Has metolazone PTA  Daughter confirmed fluid overload in the past  CCM on board  Gentle hydration, on TF  Respiratory failure  Intubated off sedation  CCM on board  on weaning trial now  Extubate as able  Hypertension . Stable  BP goal < 160 due to hemorrhagic conversion  Off Neo-Synephrine  Hyperlipidemia  Home meds:  none   LDL 111, goal < 70  Now on lipitor 20  Continue statin at discharge  Other Stroke Risk Factors  Advanced age  Hx of stroke on image - left PCA old infarct on CT and MRI  Other Active Problems  CKD stage II - Cre 1.23->1.17 - gentle hydration with TF  Leukocytosis - WBC 14.2->14.7 - afebrile   Hospital day # 3  This patient is critically ill due to left MCA large infarct, afib not on AC, hemorrhagic conversion, CHF and at significant risk of  neurological worsening, death form recurrent stroke, cerebral edema, brain herniation, heart failure, seizure. This patient's care requires constant monitoring of vital signs, hemodynamics, respiratory and cardiac monitoring, review of multiple databases, neurological assessment, discussion with family, other specialists and medical decision making of high complexity. I spent 40 minutes of neurocritical care time in the care of this patient. I had long discussion with daughter at bedside, updated pt current condition, treatment plan and potential poor prognosis. She expressed understanding and appreciation. Daughter wants aggressive care including trach PEG and nursing home placement.   Marvel Plan, MD PhD Stroke Neurology 01/30/2018 11:15 AM  To contact Stroke Continuity provider, please refer to [email or web address] After hours, contact General Neurology

## 2018-01-30 NOTE — Progress Notes (Signed)
NAME:  Emmagene Sester, MRN:  [id number], DOB:  February 26, 1929, LOS: 3 ADMISSION DATE:  01/27/2018, CONSULTATION DATE:  1/26  REFERRING MD:  Mesner, CHIEF COMPLAINT:  Acute encephalopathy   Brief History   85 y/o female admitted on 1/26 with embolic left MCA stroke.   Past Medical History  HTN, CHF  Significant Hospital Events   Admission/intubation 1/26  Consults:  PCCM Neurology  Procedures:   1/26 intubated>>  Significant Diagnostic Tests:  CTA 1/26: 1. Complete occlusion of the left M1 segment due to an embolus. Focal narrowing and irregularity of the mid cervical ICA on the left with diameter of 2.6 mm. This probably represents ulcerated soft plaque and this could be a source of embolus. MRI brain 1/27> large ischemic left MCA stroke, no midline shift ECHO 1/27> EF 55-65%, moderate LVH pattern CT head 1/28> evolving L MCA infarct with new 20mm midline shift  Micro Data:  1/26 MRSA> neg  Antimicrobials:   none  Interim history/subjective:  Off sedation this morning, remains RASS -2. changed to PSV.CPAP this morning.  Objective   Blood pressure 114/65, pulse 91, temperature 98 F (36.7 C), temperature source Axillary, resp. rate (!) 27, height 5\' 3"  (1.6 m), weight 71 kg, SpO2 99 %.    Vent Mode: PSV;CPAP FiO2 (%):  [30 %-40 %] 30 % Set Rate:  [20 bmp] 20 bmp Vt Set:  [320 mL-460 mL] 320 mL PEEP:  [5 cmH20] 5 cmH20 Pressure Support:  [15 cmH20] 15 cmH20 Plateau Pressure:  [15 cmH20-19 cmH20] 16 cmH20   Intake/Output Summary (Last 24 hours) at 01/30/2018 0926 Last data filed at 01/30/2018 0800 Gross per 24 hour  Intake 1324.6 ml  Output 1720 ml  Net -395.4 ml   Filed Weights   01/27/18 1724 01/29/18 0308 01/30/18 0500  Weight: 68 kg 71 kg 71 kg    Examination:  General:  Elderly adult female, intubated, NAD HENT: NCAT, ETT secure. Trachea midline. Moist mucus membranes  PULM: CTA bilaterally. Symmetrical lung expansion.  CV: Irregular rhythm. No r/g/m. 2+  radial pulses 1+ pedal pulses bilaterally  GI: soft round, non-distended. Normoactive x4 MSK: symmetrical bulk and tone. No obvious joint deformity  Neuro: Opens eyes to voice, spontaneously. Moves RLE spontaneously. Does not follow commands   Resolved Hospital Problem list    Hypotension  Assessment & Plan:   Acute respiratory failure with hypoxia requiring mechanical ventilation P Continue mechanical ventilation Trial PSV/CPAP this morning. Change to [facility] when needed VAP prevention AM Wake up assessment and SBT Currently not candidate for extubation due to mental status.  Serial assessment of extubation readiness  Acute Left MCA CVA -some midline shift -poor prognosis P -per neurology -SQ Heparin, per neurology -No antithrombotic due to hemorrhagic conversion, per neurology   Atrial fibrillation P Continue continuous telemetry  Rate continues to be <100   Acute kidney injury -Cr 1.17 (1/29) P -continue enteral nutrition, if needed can consider increasing flush amounts -Trend BMP, UOP Replace electrolytes PRN   Hyperglycemia P Continue q4CBG SSI if >180   Best practice:  Diet: Enteral nutrition Pain/Anxiety/Delirium protocol (if indicated): PAD- RASS goal 0 to -1 VAP protocol (if indicated): Yes DVT prophylaxis: SQ Heparin GI prophylaxis: Pantoprazole  Glucose control: Monitor Mobility: bed rest Code Status: full Family Communication: daughter at bedside 1/29, updated  Disposition: Continue ICU care  Labs   CBC: Recent Labs  Lab 01/27/18 1553 01/28/18 0639 01/29/18 1636 01/30/18 0543  WBC 13.2* 14.2* 15.1* 14.7*  NEUTROABS 10.9*  --   --   --  HGB 13.4 14.5 13.0 11.8*  HCT 43.7 44.6 39.4 36.8  MCV 92.4 88.1 87.6 88.9  PLT 244 240 244 211    Basic Metabolic Panel: Recent Labs  Lab 01/27/18 1553 01/27/18 1605 01/28/18 0639 01/28/18 1449 01/29/18 0454 01/29/18 1636 01/30/18 0543  NA 138  --  136  --   --  137 141  K 3.6  --  3.7   --   --  2.8* 3.8  CL 102  --  97*  --   --  102 107  CO2 22  --  23  --   --  22 24  GLUCOSE 120*  --  87  --   --  128* 149*  BUN 23  --  23  --   --  36* 40*  CREATININE 1.19* 1.00 1.23*  --   --  1.14* 1.17*  CALCIUM 9.3  --  9.1  --   --  8.2* 8.2*  MG  --   --  2.0 2.0 2.1 2.1  --   PHOS  --   --  3.5 4.0 4.3 3.4  --    GFR: Estimated Creatinine Clearance: 32.6 mL/min (A) (by C-G formula based on SCr of 1.17 mg/dL (H)). Recent Labs  Lab 01/27/18 1553 01/28/18 0639 01/29/18 1636 01/30/18 0543  WBC 13.2* 14.2* 15.1* 14.7*    Liver Function Tests: Recent Labs  Lab 01/27/18 1553 01/28/18 0639  AST 66* 74*  ALT 38 37  ALKPHOS 66 63  BILITOT 1.1 1.7*  PROT 7.6 7.6  ALBUMIN 3.6 3.4*   No results for input(s): LIPASE, AMYLASE in the last 168 hours. No results for input(s): AMMONIA in the last 168 hours.  ABG    Component Value Date/Time   PHART 7.583 (H) 01/28/2018 0615   PCO2ART 25.8 (L) 01/28/2018 0615   PO2ART 92.3 01/28/2018 0615   HCO3 24.4 01/28/2018 0615   O2SAT 97.6 01/28/2018 0615     Coagulation Profile: Recent Labs  Lab 01/27/18 1553  INR 1.12    Cardiac Enzymes: No results for input(s): CKTOTAL, CKMB, CKMBINDEX, TROPONINI in the last 168 hours.  HbA1C: Hgb A1c MFr Bld  Date/Time Value Ref Range Status  01/28/2018 06:39 AM 5.5 4.8 - 5.6 % Final    Comment:    (NOTE) Pre diabetes:          5.7%-6.4% Diabetes:              >6.4% Glycemic control for   <7.0% adults with diabetes     CBG: Recent Labs  Lab 01/29/18 1526 01/29/18 1946 01/29/18 2320 01/30/18 0318 01/30/18 0735  GLUCAP 158* 146* 166* 130* 128*     Critical care time: 35min    Tessie FassGrace Ambyr Qadri MSN, AGACNP-BC Central Pulmonary/Critical Care Medicine 01/30/2018, 9:26 AM

## 2018-01-30 NOTE — Progress Notes (Signed)
Patients Daughter requests Palliative Care consult in the morning. RN will address with oncoming shift in the morning.

## 2018-01-31 LAB — BASIC METABOLIC PANEL
Anion gap: 9 (ref 5–15)
BUN: 36 mg/dL — ABNORMAL HIGH (ref 8–23)
CO2: 26 mmol/L (ref 22–32)
Calcium: 8 mg/dL — ABNORMAL LOW (ref 8.9–10.3)
Chloride: 108 mmol/L (ref 98–111)
Creatinine, Ser: 1.01 mg/dL — ABNORMAL HIGH (ref 0.44–1.00)
GFR calc Af Amer: 58 mL/min — ABNORMAL LOW (ref >60)
GFR calc non Af Amer: 50 mL/min — ABNORMAL LOW (ref >60)
Glucose, Bld: 161 mg/dL — ABNORMAL HIGH (ref 70–99)
POTASSIUM: 3.3 mmol/L — AB (ref 3.5–5.1)
Sodium: 143 mmol/L (ref 135–145)

## 2018-01-31 LAB — GLUCOSE, CAPILLARY
GLUCOSE-CAPILLARY: 141 mg/dL — AB (ref 70–99)
Glucose-Capillary: 181 mg/dL — ABNORMAL HIGH (ref 70–99)
Glucose-Capillary: 137 mg/dL — ABNORMAL HIGH (ref 70–99)
Glucose-Capillary: 155 mg/dL — ABNORMAL HIGH (ref 70–99)
Glucose-Capillary: 153 mg/dL — ABNORMAL HIGH (ref 70–99)
Glucose-Capillary: 154 mg/dL — ABNORMAL HIGH (ref 70–99)

## 2018-01-31 LAB — CBC
HCT: 36.3 % (ref 36.0–46.0)
Hemoglobin: 11.6 g/dL — ABNORMAL LOW (ref 12.0–15.0)
MCH: 28.9 pg (ref 26.0–34.0)
MCHC: 32 g/dL (ref 30.0–36.0)
MCV: 90.3 fL (ref 80.0–100.0)
Platelets: 250 10*3/uL (ref 150–400)
RBC: 4.02 MIL/uL (ref 3.87–5.11)
RDW: 16.2 % — ABNORMAL HIGH (ref 11.5–15.5)
WBC: 14.3 10*3/uL — ABNORMAL HIGH (ref 4.0–10.5)
nRBC: 0 % (ref 0.0–0.2)

## 2018-01-31 LAB — MAGNESIUM: Magnesium: 2.2 mg/dL (ref 1.7–2.4)

## 2018-01-31 LAB — PHOSPHORUS: Phosphorus: 3 mg/dL (ref 2.5–4.6)

## 2018-01-31 MED ORDER — POTASSIUM CHLORIDE 20 MEQ/15ML (10%) PO SOLN
40.0000 meq | Freq: Four times a day (QID) | ORAL | Status: AC
  Administered 2018-01-31 (×2): 40 meq via ORAL
  Filled 2018-01-31 (×2): qty 30

## 2018-01-31 MED ORDER — POLYETHYLENE GLYCOL 3350 17 G PO PACK
17.0000 g | PACK | Freq: Two times a day (BID) | ORAL | Status: DC
  Administered 2018-01-31 – 2018-02-16 (×14): 17 g
  Filled 2018-01-31 (×14): qty 1

## 2018-01-31 MED ORDER — SODIUM CHLORIDE 0.9 % IV SOLN
250.0000 mL | INTRAVENOUS | Status: DC
  Administered 2018-01-31: 250 mL via INTRAVENOUS

## 2018-01-31 MED ORDER — BISACODYL 10 MG RE SUPP
10.0000 mg | Freq: Every day | RECTAL | Status: DC | PRN
  Administered 2018-01-31: 10 mg via RECTAL
  Filled 2018-01-31: qty 1

## 2018-01-31 MED ORDER — POLYETHYLENE GLYCOL 3350 17 G PO PACK
17.0000 g | PACK | Freq: Two times a day (BID) | ORAL | Status: DC
  Filled 2018-01-31: qty 1

## 2018-01-31 MED ORDER — DOCUSATE SODIUM 50 MG/5ML PO LIQD
100.0000 mg | Freq: Two times a day (BID) | ORAL | Status: DC
  Administered 2018-01-31 – 2018-02-15 (×15): 100 mg
  Filled 2018-01-31 (×15): qty 10

## 2018-01-31 NOTE — Plan of Care (Signed)
  Problem: Nutrition: Goal: Adequate nutrition will be maintained Outcome: Progressing   

## 2018-01-31 NOTE — Progress Notes (Signed)
0400 CBG 181. Notified MD per order. No interventions at this time.

## 2018-01-31 NOTE — Progress Notes (Signed)
NAME:  Kendel Richarson, MRN:  [id number], DOB:  May 02, 1929, LOS: 4 ADMISSION DATE:  01/27/2018, CONSULTATION DATE:  1/26  REFERRING MD:  Mesner, CHIEF COMPLAINT:  Acute encephalopathy   Brief History   85 y/o female admitted on 1/26 with embolic left MCA stroke.   Past Medical History  HTN, CHF  Significant Hospital Events   Admission/intubation 1/26  Consults:  PCCM Neurology  Procedures:   1/26 intubated>>  Significant Diagnostic Tests:  CTA 1/26: 1. Complete occlusion of the left M1 segment due to an embolus. Focal narrowing and irregularity of the mid cervical ICA on the left with diameter of 2.6 mm. This probably represents ulcerated soft plaque and this could be a source of embolus. MRI brain 1/27> large ischemic left MCA stroke, no midline shift ECHO 1/27> EF 55-65%, moderate LVH pattern CT head 1/28> evolving L MCA infarct with new 9mm midline shift  Micro Data:  1/26 MRSA> neg  Antimicrobials:   none  Interim history/subjective:  PSV/CPAP trial this morning Daughter at bedside  Objective   Blood pressure 118/62, pulse (!) 117, temperature 98.5 F (36.9 C), temperature source Axillary, resp. rate (!) 28, height 5\' 3"  (1.6 m), weight 71.3 kg, SpO2 97 %.    Vent Mode: PSV;CPAP FiO2 (%):  [30 %] 30 % Set Rate:  [20 bmp] 20 bmp Vt Set:  [320 mL] 320 mL PEEP:  [5 cmH20] 5 cmH20 Pressure Support:  [15 cmH20] 15 cmH20 Plateau Pressure:  [17 cmH20-19 cmH20] 17 cmH20   Intake/Output Summary (Last 24 hours) at 01/31/2018 1107 Last data filed at 01/31/2018 0900 Gross per 24 hour  Intake 1336.8 ml  Output 755 ml  Net 581.8 ml   Filed Weights   01/29/18 0308 01/30/18 0500 01/31/18 0500  Weight: 71 kg 71 kg 71.3 kg    Examination:  General:  Elderly adult female, intubated, NAD HENT:NCAT, ETT secure, mmm, trachea midline PULM: CTA, symmetrical chest expansion, no accessory muscle use CV: irregular rhythm, 2+ radial and pedal pulses GI: soft, round, ND,  normoactive x4 MSK: no obvious joint deformity. Symmetrical bulk and tone.  Neuro: opens eyes to stimulation, does not follow commands.   Resolved Hospital Problem list    Hypotension  Assessment & Plan:   Acute respiratory failure with hypoxia requiring mechanical ventilation P Continue mechanical vent support PSV/CPAP this morning, will revert to [facility] when needed Continue AM SBT Continuing off sedation.  VAP prevention  AM CXR Big picture, family contemplating trach/PEG. Family requesting palliative care consult  Acute Left MCA CVA -some midline shift -poor prognosis P -per neurology -SQ Heparin, per neurology -No antithrombotic due to hemorrhagic conversion, per neurology   Atrial fibrillation P Continuous tele monitoring Rate continues to be <100  CHF EF 55-60% Home metolazone P Judicious IVF, consider diuresis if signs of fluid overload Strict I/O   Acute kidney injury -improving Cr  P -Trend BMP, Strict I/O  Hyperglycemia P Continue q4CBG Start SSI if >180   Best practice:  Diet: Enteral nutrition Pain/Anxiety/Delirium protocol (if indicated): PAD- RASS goal 0 to -1 VAP protocol (if indicated): Yes DVT prophylaxis: SCD sq Heparin GI prophylaxis: Pantoprazole  Glucose control: Monito Mobility: bed rest Code Status: full Family Communication: 2x daughters and granddaughter at bedside 1/30. Updated RE vent requirement. Family requesting palliative care involvement given differing views regarding patient's wishes in care Disposition: Continue ICU care  Labs   CBC: Recent Labs  Lab 01/27/18 1553 01/28/18 3552 01/29/18 1636 01/30/18 0543 01/31/18 0440  WBC 13.2* 14.2* 15.1* 14.7* 14.3*  NEUTROABS 10.9*  --   --   --   --   HGB 13.4 14.5 13.0 11.8* 11.6*  HCT 43.7 44.6 39.4 36.8 36.3  MCV 92.4 88.1 87.6 88.9 90.3  PLT 244 240 244 211 250    Basic Metabolic Panel: Recent Labs  Lab 01/27/18 1553 01/27/18 1605 01/28/18 0639  01/28/18 1449 01/29/18 0454 01/29/18 1636 01/30/18 0543 01/31/18 0440  NA 138  --  136  --   --  137 141 143  K 3.6  --  3.7  --   --  2.8* 3.8 3.3*  CL 102  --  97*  --   --  102 107 108  CO2 22  --  23  --   --  22 24 26   GLUCOSE 120*  --  87  --   --  128* 149* 161*  BUN 23  --  23  --   --  36* 40* 36*  CREATININE 1.19* 1.00 1.23*  --   --  1.14* 1.17* 1.01*  CALCIUM 9.3  --  9.1  --   --  8.2* 8.2* 8.0*  MG  --   --  2.0 2.0 2.1 2.1  --   --   PHOS  --   --  3.5 4.0 4.3 3.4  --   --    GFR: Estimated Creatinine Clearance: 37.9 mL/min (A) (by C-G formula based on SCr of 1.01 mg/dL (H)). Recent Labs  Lab 01/28/18 0639 01/29/18 1636 01/30/18 0543 01/31/18 0440  WBC 14.2* 15.1* 14.7* 14.3*    Liver Function Tests: Recent Labs  Lab 01/27/18 1553 01/28/18 0639  AST 66* 74*  ALT 38 37  ALKPHOS 66 63  BILITOT 1.1 1.7*  PROT 7.6 7.6  ALBUMIN 3.6 3.4*   No results for input(s): LIPASE, AMYLASE in the last 168 hours. No results for input(s): AMMONIA in the last 168 hours.  ABG    Component Value Date/Time   PHART 7.583 (H) 01/28/2018 0615   PCO2ART 25.8 (L) 01/28/2018 0615   PO2ART 92.3 01/28/2018 0615   HCO3 24.4 01/28/2018 0615   O2SAT 97.6 01/28/2018 0615     Coagulation Profile: Recent Labs  Lab 01/27/18 1553  INR 1.12    Cardiac Enzymes: No results for input(s): CKTOTAL, CKMB, CKMBINDEX, TROPONINI in the last 168 hours.  HbA1C: Hgb A1c MFr Bld  Date/Time Value Ref Range Status  01/28/2018 06:39 AM 5.5 4.8 - 5.6 % Final    Comment:    (NOTE) Pre diabetes:          5.7%-6.4% Diabetes:              >6.4% Glycemic control for   <7.0% adults with diabetes     CBG: Recent Labs  Lab 01/30/18 1548 01/30/18 1950 01/30/18 2339 01/31/18 0321 01/31/18 0740  GLUCAP 132* 132* 148* 181* 141*     Critical care time: 40 minutes    Tessie Fass MSN, AGACNP-BC  Pulmonary/Critical Care Medicine 01/31/2018, 11:07 AM

## 2018-01-31 NOTE — Progress Notes (Signed)
STROKE TEAM PROGRESS NOTE   SUBJECTIVE (INTERVAL HISTORY) Her daughters and grand daughter and grandson are at the bedside.  Overall her condition is stable.  She is on weaning trials.  eyes open on stimulation but still not following commands.  Still has right hemiplegia.  OBJECTIVE Temp:  [98.2 F (36.8 C)-98.5 F (36.9 C)] 98.5 F (36.9 C) (01/30 0800) Pulse Rate:  [52-117] 99 (01/30 1149) Cardiac Rhythm: Atrial fibrillation (01/30 0800) Resp:  [0-30] 30 (01/30 1149) BP: (118-162)/(60-109) 127/75 (01/30 1149) SpO2:  [93 %-100 %] 98 % (01/30 1150) FiO2 (%):  [30 %] 30 % (01/30 1150) Weight:  [71.3 kg] 71.3 kg (01/30 0500)  Recent Labs  Lab 01/30/18 1950 01/30/18 2339 01/31/18 0321 01/31/18 0740 01/31/18 1132  GLUCAP 132* 148* 181* 141* 137*   Recent Labs  Lab 01/27/18 1553 01/27/18 1605 01/28/18 0639 01/28/18 1449 01/29/18 0454 01/29/18 1636 01/30/18 0543 01/31/18 0440  NA 138  --  136  --   --  137 141 143  K 3.6  --  3.7  --   --  2.8* 3.8 3.3*  CL 102  --  97*  --   --  102 107 108  CO2 22  --  23  --   --  22 24 26   GLUCOSE 120*  --  87  --   --  128* 149* 161*  BUN 23  --  23  --   --  36* 40* 36*  CREATININE 1.19* 1.00 1.23*  --   --  1.14* 1.17* 1.01*  CALCIUM 9.3  --  9.1  --   --  8.2* 8.2* 8.0*  MG  --   --  2.0 2.0 2.1 2.1  --  2.2  PHOS  --   --  3.5 4.0 4.3 3.4  --  3.0   Recent Labs  Lab 01/27/18 1553 01/28/18 0639  AST 66* 74*  ALT 38 37  ALKPHOS 66 63  BILITOT 1.1 1.7*  PROT 7.6 7.6  ALBUMIN 3.6 3.4*   Recent Labs  Lab 01/27/18 1553 01/28/18 0639 01/29/18 1636 01/30/18 0543 01/31/18 0440  WBC 13.2* 14.2* 15.1* 14.7* 14.3*  NEUTROABS 10.9*  --   --   --   --   HGB 13.4 14.5 13.0 11.8* 11.6*  HCT 43.7 44.6 39.4 36.8 36.3  MCV 92.4 88.1 87.6 88.9 90.3  PLT 244 240 244 211 250   No results for input(s): CKTOTAL, CKMB, CKMBINDEX, TROPONINI in the last 168 hours. No results for input(s): LABPROT, INR in the last 72 hours. No  results for input(s): COLORURINE, LABSPEC, PHURINE, GLUCOSEU, HGBUR, BILIRUBINUR, KETONESUR, PROTEINUR, UROBILINOGEN, NITRITE, LEUKOCYTESUR in the last 72 hours.  Invalid input(s): APPERANCEUR     Component Value Date/Time   CHOL 168 01/28/2018 0639   TRIG 75 01/28/2018 0639   HDL 42 01/28/2018 0639   CHOLHDL 4.0 01/28/2018 0639   VLDL 15 01/28/2018 0639   LDLCALC 111 (H) 01/28/2018 0639   Lab Results  Component Value Date   HGBA1C 5.5 01/28/2018   No results found for: LABOPIA, COCAINSCRNUR, LABBENZ, AMPHETMU, THCU, LABBARB  No results for input(s): ETH in the last 168 hours.  I have personally reviewed the radiological images below and agree with the radiology interpretations.  Ct Angio Head W Or Wo Contrast  Result Date: 01/27/2018 CLINICAL DATA:  Code stroke. Found on the ground. Right-sided weakness. EXAM: CT ANGIOGRAPHY HEAD AND NECK TECHNIQUE: Multidetector CT imaging of the head and neck  was performed using the standard protocol during bolus administration of intravenous contrast. Multiplanar CT image reconstructions and MIPs were obtained to evaluate the vascular anatomy. Carotid stenosis measurements (when applicable) are obtained utilizing NASCET criteria, using the distal internal carotid diameter as the denominator. CONTRAST:  29mL ISOVUE-370 IOPAMIDOL (ISOVUE-370) INJECTION 76% COMPARISON:  CT earlier same day FINDINGS: CTA NECK FINDINGS Aortic arch: Aortic atherosclerosis with considerable luminal irregularity. Branching pattern of great toe cephalic vessels from the arch is normal. No origin stenosis greater than 30%. Right carotid system: Right common carotid artery is tortuous but widely patent to the bifurcation. Carotid bifurcation is free of atherosclerotic plaque or narrowing. Right cervical ICA is widely patent though tortuous. Left carotid system: Common carotid artery is tortuous but widely patent to the bifurcation. Carotid bifurcation shows minimal calcified plaque  at the proximal ICA but there is no stenosis. Cervical ICA shows focal narrowing with wall thickening and irregularity in its midportion with diameter of 2.6 mm. This is a stenosis of about 50%. The wall irregularity presumably represents plaque/thrombus and could be a source of embolus. Vertebral arteries: There is atherosclerotic plaque at both vertebral artery origins but no stenosis greater than 30%. Beyond that, both vertebral arteries are widely patent through the cervical region to the foramen magnum. Skeleton: Ordinary cervical spondylosis. Upper thoracic congenital failure of separation. Other neck: No mass or lymphadenopathy. Upper chest: Right pleural effusion. Pulmonary scarring. Question interstitial edema. Review of the MIP images confirms the above findings CTA HEAD FINDINGS Anterior circulation: Both internal carotid arteries are patent through the siphon region. On the right, there is atherosclerotic calcification but no stenosis. The anterior and middle cerebral vessels are patent. On the left, there is complete occlusion of the left M1 segment due to an embolus. Flow is present within the left anterior cerebral artery. There is poor collateral demonstration, though the contrast phase is quite early. Posterior circulation: Both vertebral arteries are patent to the basilar. Early phase contrast enhancement makes it difficult to accurately evaluate the distal vertebral arteries but I think both are sufficiently patent to the basilar. No basilar stenosis is seen. Superior cerebellar and posterior cerebral vessels are patent, though there distal branches are not yet opacified. Venous sinuses: Too early to assess venous sinuses. Anatomic variants: None other significant. Delayed phase: Not performed. Review of the MIP images confirms the above findings IMPRESSION: 1. Complete occlusion of the left M1 segment due to an embolus. Flow is present within the left anterior cerebral artery. 2. Atherosclerotic  disease at both carotid bifurcations but no stenosis. 3. Focal narrowing and irregularity of the mid cervical ICA on the left with diameter of 2.6 mm. This probably represents ulcerated soft plaque and this could be a source of embolus. 4. These results were communicated to Dr. Otelia Limes at 4:50 pmon 1/26/2020by text page via the [facility] messaging system. Electronically Signed   By: Paulina Fusi M.D.   On: 01/27/2018 16:54   Ct Angio Neck W Or Wo Contrast  Result Date: 01/27/2018 CLINICAL DATA:  Code stroke. Found on the ground. Right-sided weakness. EXAM: CT ANGIOGRAPHY HEAD AND NECK TECHNIQUE: Multidetector CT imaging of the head and neck was performed using the standard protocol during bolus administration of intravenous contrast. Multiplanar CT image reconstructions and MIPs were obtained to evaluate the vascular anatomy. Carotid stenosis measurements (when applicable) are obtained utilizing NASCET criteria, using the distal internal carotid diameter as the denominator. CONTRAST:  26mL ISOVUE-370 IOPAMIDOL (ISOVUE-370) INJECTION 76% COMPARISON:  CT earlier  same day FINDINGS: CTA NECK FINDINGS Aortic arch: Aortic atherosclerosis with considerable luminal irregularity. Branching pattern of great toe cephalic vessels from the arch is normal. No origin stenosis greater than 30%. Right carotid system: Right common carotid artery is tortuous but widely patent to the bifurcation. Carotid bifurcation is free of atherosclerotic plaque or narrowing. Right cervical ICA is widely patent though tortuous. Left carotid system: Common carotid artery is tortuous but widely patent to the bifurcation. Carotid bifurcation shows minimal calcified plaque at the proximal ICA but there is no stenosis. Cervical ICA shows focal narrowing with wall thickening and irregularity in its midportion with diameter of 2.6 mm. This is a stenosis of about 50%. The wall irregularity presumably represents plaque/thrombus and could be a source of  embolus. Vertebral arteries: There is atherosclerotic plaque at both vertebral artery origins but no stenosis greater than 30%. Beyond that, both vertebral arteries are widely patent through the cervical region to the foramen magnum. Skeleton: Ordinary cervical spondylosis. Upper thoracic congenital failure of separation. Other neck: No mass or lymphadenopathy. Upper chest: Right pleural effusion. Pulmonary scarring. Question interstitial edema. Review of the MIP images confirms the above findings CTA HEAD FINDINGS Anterior circulation: Both internal carotid arteries are patent through the siphon region. On the right, there is atherosclerotic calcification but no stenosis. The anterior and middle cerebral vessels are patent. On the left, there is complete occlusion of the left M1 segment due to an embolus. Flow is present within the left anterior cerebral artery. There is poor collateral demonstration, though the contrast phase is quite early. Posterior circulation: Both vertebral arteries are patent to the basilar. Early phase contrast enhancement makes it difficult to accurately evaluate the distal vertebral arteries but I think both are sufficiently patent to the basilar. No basilar stenosis is seen. Superior cerebellar and posterior cerebral vessels are patent, though there distal branches are not yet opacified. Venous sinuses: Too early to assess venous sinuses. Anatomic variants: None other significant. Delayed phase: Not performed. Review of the MIP images confirms the above findings IMPRESSION: 1. Complete occlusion of the left M1 segment due to an embolus. Flow is present within the left anterior cerebral artery. 2. Atherosclerotic disease at both carotid bifurcations but no stenosis. 3. Focal narrowing and irregularity of the mid cervical ICA on the left with diameter of 2.6 mm. This probably represents ulcerated soft plaque and this could be a source of embolus. 4. These results were communicated to Dr.  Otelia LimesLindzen at 4:50 pmon 1/26/2020by text page via the [facility] messaging system. Electronically Signed   By: Paulina FusiMark  Shogry M.D.   On: 01/27/2018 16:54   Ct Cervical Spine Wo Contrast  Result Date: 01/27/2018 CLINICAL DATA:  Found down. Stroke. Clinical concern for cervical spine fracture. EXAM: CT CERVICAL SPINE WITHOUT CONTRAST TECHNIQUE: Multidetector CT imaging of the cervical spine was performed without intravenous contrast. Multiplanar CT image reconstructions were also generated. COMPARISON:  Head CT obtained at the same time. FINDINGS: Alignment: Minimal reversal of the normal lordosis. No subluxations. Skull base and vertebrae: No acute fracture. No primary bone lesion or focal pathologic process. Soft tissues and spinal canal: No prevertebral fluid or swelling. No visible canal hematoma. Disc levels:  Multilevel degenerative changes. Upper chest: Minimal biapical bullous changes. Small to moderate-sized right pleural effusion. Other: Endotracheal and orogastric tubes are in place. Small right lobe thyroid nodule measuring 6 mm in maximum diameter. Minimal carotid artery calcification on the left. IMPRESSION: 1. No fracture or subluxation. 2. Minimal reversal of  the normal lordosis. 3. Multilevel degenerative changes. 4. Small to moderate-sized right pleural effusion. 5. Minimal left carotid artery atheromatous calcification. 6. 6 mm right lobe thyroid nodule. This is too small to characterize, but most likely benign in the absence of known clinical risk factors for thyroid carcinoma. Electronically Signed   By: Beckie Salts M.D.   On: 01/27/2018 16:46   Mr Brain Wo Contrast  Result Date: 01/28/2018 CLINICAL DATA:  Found unconscious at home.  MCA occlusion. EXAM: MRI HEAD WITHOUT CONTRAST TECHNIQUE: Multiplanar, multiecho pulse sequences of the brain and surrounding structures were obtained without intravenous contrast. COMPARISON:  CTA head neck 01/27/2018 FINDINGS: BRAIN: Large area of ischemic infarct  within the left MCA territory, involving most of the frontal operculum and insula, as well as parts of the left basal ganglia and left temporal lobe. The midline structures are normal. There is moderate edema throughout the ischemic territory. Early confluent hyperintense T2-weighted signal of the periventricular and deep white matter, most commonly due to chronic ischemic microangiopathy. There is petechial hemorrhage throughout the infarcted area. There is laminar necrosis at a site of remote infarct in the left occipital lobe. VASCULAR: Major intracranial arterial and venous sinus flow voids are normal. SKULL AND UPPER CERVICAL SPINE: Right frontal scalp hematoma. SINUSES/ORBITS: No fluid levels or advanced mucosal thickening. No mastoid or middle ear effusion. The orbits are normal. IMPRESSION: 1. Large ischemic infarct within the left MCA territory with confluent petechial hemorrhage throughout the affected area Heidelberg classification 1b: HI2, confluent petechiae, no mass effect. 2. No midline shift or hydrocephalus. Electronically Signed   By: Deatra Robinson M.D.   On: 01/28/2018 04:50   Ct Head Code Stroke Wo Contrast  Result Date: 01/27/2018 CLINICAL DATA:  Code stroke. Last seen normal 2100 hours yesterday. Flaccid on the right side. EXAM: CT HEAD WITHOUT CONTRAST TECHNIQUE: Contiguous axial images were obtained from the base of the skull through the vertex without intravenous contrast. COMPARISON:  None. FINDINGS: Brain: Brainstem and cerebellum are unremarkable except for chronic small-vessel ischemic changes of the pons. Old infarction in the left occipital lobe. Acute infarction in the left hemisphere in the MCA territory affecting the insula, deep white matter and cortical brain in 3 middle cerebral artery sectors. No evidence of hemorrhage. No mass effect or shift. Chronic small-vessel ischemic changes affect the white matter elsewhere. Vascular: Hyperdense left MCA in the distal M1/bifurcation  region. Skull: Negative Sinuses/Orbits: Clear/normal Other: None ASPECTS (Alberta Stroke Program Early CT Score) - Ganglionic level infarction (caudate, lentiform nuclei, internal capsule, insula, M1-M3 cortex): 4 - Supraganglionic infarction (M4-M6 cortex): 2 Total score (0-10 with 10 being normal): 6 IMPRESSION: 1. Acute infarction in the left MCA territory affecting the insula, deep white matter and cortical brain in 3 middle cerebral artery sectors. No hemorrhage or mass effect. 2. ASPECTS is 6. 3. Hyperdense left MCA. *This report was discussed by telephone with Dr. Otelia Limes at 1621 hours. Electronically Signed   By: Paulina Fusi M.D.   On: 01/27/2018 16:31    PHYSICAL EXAM  Temp:  [98.2 F (36.8 C)-98.5 F (36.9 C)] 98.5 F (36.9 C) (01/30 0800) Pulse Rate:  [52-117] 99 (01/30 1149) Resp:  [0-30] 30 (01/30 1149) BP: (118-162)/(60-109) 127/75 (01/30 1149) SpO2:  [93 %-100 %] 98 % (01/30 1150) FiO2 (%):  [30 %] 30 % (01/30 1150) Weight:  [71.3 kg] 71.3 kg (01/30 0500)  General - Well nourished, well developed, intubated off sedation  Ophthalmologic - fundi not visualized due to  noncooperation.  Cardiovascular - irregularly irregular heart rate and rhythm.  Neuro - intubated off sedation, eyes open, seems able to follow commands on eye closed and opening, however not following any other commands. Eyes in left gaze position, not consistent blinking to visual threat bilaterally, doll's eyes present, not tracking, PERRL. Corneal reflex present bilaterally, gag and cough present. Breathing over the vent.  Facial symmetry not able to test due to ET tube.  Tongue midline in mouth. On pain stimulation, slight withdraw of RUE and RLE, left arm can hold against gravity, LLE mild withdraw but able to wiggle toes spontaneously. DTR 1+ and positive babinski on the right. Sensation, coordination and gait not tested.   ASSESSMENT/PLAN Ms. Kelli Vazquez is a 85 y.o. female spanish speaking only with no  documented history but seems to have afib and CHF and HTN admitted for found down at home, unresponsive, with right side flaccid. Intubated in ED. No tPA given due to outside window and established infarct.   Stroke:  left large MCA infarct embolic pattern secondary to afib not on AC  CT head - old left PCA infarct, acute left MCA large infarct  MRI  Left large MCA infarct with hemorrhagic conversion, old left PCA infarct  CTA head and neck - left M1 occlusion  2D Echo EF 55 to 60%  CT repeat 01/29/18 increased mass effect with MLS 47mm and hemorrhagic conversion unchanged  LDL 111  HgbA1c 5.5  Heparin subq for VTE prophylaxis  NPO  aspirin 81 mg daily prior to admission, now on No antithrombotic given significant hemorrhagic conversion.  Ongoing aggressive stroke risk factor management  Therapy recommendations:  Pending   Disposition:  Pending, discussed with daughters yesterday about GOC, she would like aggressive care including trach PEG NH placement if needed.  However, today discussed with daughters and grand children, they would like to have further meeting about themselves.  Afib  Home meds including cardizem  EKG on admission confirmed afib  On ASA at home  Likely the cause of current stroke  Rate under control  No antiplatelet for now due to significant hemorrhagic conversion  CHF  TTE EF 55 to 60%  Has metolazone PTA  Daughter confirmed fluid overload in the past  CCM on board  Gentle hydration, on TF  Respiratory failure  Intubated off sedation  CCM on board  on weaning trials, seems tolerating  Extubate as able  Hypertension . Stable  BP goal < 160 due to hemorrhagic conversion  Off Neo-Synephrine  Hyperlipidemia  Home meds:  none   LDL 111, goal < 70  Now on lipitor 20  Continue statin at discharge  Other Stroke Risk Factors  Advanced age  Hx of stroke on image - left PCA old infarct on CT and MRI  Other Active  Problems  CKD stage II - Cre 1.23->1.17 - gentle hydration with TF ->1.01  Leukocytosis - WBC 14.2->14.7->14.3 - afebrile   Hospital day # 4  This patient is critically ill due to left MCA large infarct, afib not on AC, hemorrhagic conversion, CHF and at significant risk of neurological worsening, death form recurrent stroke, cerebral edema, brain herniation, heart failure, seizure. This patient's care requires constant monitoring of vital signs, hemodynamics, respiratory and cardiac monitoring, review of multiple databases, neurological assessment, discussion with family, other specialists and medical decision making of high complexity. I spent 40 minutes of neurocritical care time in the care of this patient. I had long discussion with daughters and grandchildren  at bedside, updated pt current condition, treatment plan and potential poor prognosis.  They expressed understanding and appreciation.  They will have further discussion on themselves regarding GOC.   Marvel PlanJindong Kyndall Chaplin, MD PhD Stroke Neurology 01/31/2018 12:34 PM    To contact Stroke Continuity provider, please refer to [email or web address]. After hours, contact General Neurology

## 2018-02-01 ENCOUNTER — Inpatient Hospital Stay (HOSPITAL_COMMUNITY): Payer: Medicare Other

## 2018-02-01 DIAGNOSIS — Z7189 Other specified counseling: Secondary | ICD-10-CM

## 2018-02-01 DIAGNOSIS — Z515 Encounter for palliative care: Secondary | ICD-10-CM

## 2018-02-01 LAB — GLUCOSE, CAPILLARY
GLUCOSE-CAPILLARY: 152 mg/dL — AB (ref 70–99)
GLUCOSE-CAPILLARY: 157 mg/dL — AB (ref 70–99)
GLUCOSE-CAPILLARY: 143 mg/dL — AB (ref 70–99)
Glucose-Capillary: 133 mg/dL — ABNORMAL HIGH (ref 70–99)
Glucose-Capillary: 141 mg/dL — ABNORMAL HIGH (ref 70–99)
Glucose-Capillary: 148 mg/dL — ABNORMAL HIGH (ref 70–99)

## 2018-02-01 LAB — BASIC METABOLIC PANEL
ANION GAP: 9 (ref 5–15)
BUN: 37 mg/dL — ABNORMAL HIGH (ref 8–23)
CO2: 27 mmol/L (ref 22–32)
Calcium: 8.4 mg/dL — ABNORMAL LOW (ref 8.9–10.3)
Chloride: 110 mmol/L (ref 98–111)
Creatinine, Ser: 0.92 mg/dL (ref 0.44–1.00)
GFR calc Af Amer: >60 mL/min (ref >60)
GFR calc non Af Amer: 56 mL/min — ABNORMAL LOW (ref >60)
GLUCOSE: 154 mg/dL — AB (ref 70–99)
Potassium: 4.3 mmol/L (ref 3.5–5.1)
Sodium: 146 mmol/L — ABNORMAL HIGH (ref 135–145)

## 2018-02-01 LAB — CBC
HCT: 36.1 % (ref 36.0–46.0)
Hemoglobin: 11.1 g/dL — ABNORMAL LOW (ref 12.0–15.0)
MCH: 28.4 pg (ref 26.0–34.0)
MCHC: 30.7 g/dL (ref 30.0–36.0)
MCV: 92.3 fL (ref 80.0–100.0)
Platelets: 260 10*3/uL (ref 150–400)
RBC: 3.91 MIL/uL (ref 3.87–5.11)
RDW: 16.3 % — ABNORMAL HIGH (ref 11.5–15.5)
WBC: 13.8 10*3/uL — ABNORMAL HIGH (ref 4.0–10.5)
nRBC: 0 % (ref 0.0–0.2)

## 2018-02-01 MED ORDER — ASPIRIN 81 MG PO CHEW: 81.0000 mg | CHEWABLE_TABLET | Freq: Every day | ORAL | Status: DC

## 2018-02-01 MED ORDER — ASPIRIN 81 MG PO CHEW
81.0000 mg | CHEWABLE_TABLET | Freq: Every day | ORAL | Status: DC
  Administered 2018-02-01 – 2018-02-19 (×18): 81 mg
  Filled 2018-02-01 (×17): qty 1

## 2018-02-01 MED ORDER — SODIUM CHLORIDE 0.9 % IV SOLN
250.0000 mL | INTRAVENOUS | Status: DC
  Administered 2018-02-11: 250 mL via INTRAVENOUS
  Administered 2018-02-14: 500 mL via INTRAVENOUS
  Administered 2018-02-17: 250 mL via INTRAVENOUS

## 2018-02-01 MED ORDER — ASPIRIN 81 MG PO CHEW
CHEWABLE_TABLET | ORAL | Status: AC
  Administered 2018-02-01: 81 mg
  Filled 2018-02-01: qty 1

## 2018-02-01 MED ORDER — FUROSEMIDE 10 MG/ML IJ SOLN
40.0000 mg | Freq: Once | INTRAMUSCULAR | Status: AC
  Administered 2018-02-01: 40 mg via INTRAVENOUS

## 2018-02-01 MED ORDER — FUROSEMIDE 10 MG/ML IJ SOLN
INTRAMUSCULAR | Status: AC
  Administered 2018-02-01: 40 mg via INTRAVENOUS
  Filled 2018-02-01: qty 4

## 2018-02-01 MED ORDER — FUROSEMIDE 10 MG/ML IJ SOLN
40.0000 mg | Freq: Once | INTRAMUSCULAR | Status: AC
  Administered 2018-02-01: 40 mg via INTRAVENOUS
  Filled 2018-02-01: qty 4

## 2018-02-01 NOTE — Care Management Note (Signed)
Case Management Note  Patient Details  Name: Kelli Vazquez MRN: [id number] Date of Birth: [birth year]  Subjective/Objective:   85 y/o female admitted on 1/26 with embolic left MCA stroke.  PTA, pt independent, lives with daughter.                  Action/Plan: Pt currently remains intubated with poor prognosis.  Palliative medicine team consult today to assist with goals of care.    Expected Discharge Date:                  Expected Discharge Plan:     In-House Referral:     Discharge planning Services  CM Consult  Post Acute Care Choice:    Choice offered to:     DME Arranged:    DME Agency:     HH Arranged:    HH Agency:     Status of Service:  In process, will continue to follow  If discussed at Long Length of Stay Meetings, dates discussed:    Additional Comments:  Quintella Baton, RN, BSN  Trauma/Neuro ICU Case Manager [phone]

## 2018-02-01 NOTE — Progress Notes (Signed)
STROKE TEAM PROGRESS NOTE   SUBJECTIVE (INTERVAL HISTORY) Her daughter is at the bedside.  Overall her condition is stable without significant change.  She is on weaning trials, still has global aphasia with right hemiplegia.  OBJECTIVE Temp:  [97.6 F (36.4 C)-99.9 F (37.7 C)] 97.6 F (36.4 C) (01/31 0809) Pulse Rate:  [41-162] 91 (01/31 1135) Cardiac Rhythm: Atrial fibrillation (01/31 0800) Resp:  [0-29] 25 (01/31 1135) BP: (103-148)/(53-137) 130/66 (01/31 1135) SpO2:  [92 %-100 %] 96 % (01/31 1136) FiO2 (%):  [30 %] 30 % (01/31 1136) Weight:  [72.9 kg] 72.9 kg (01/31 0400)  Recent Labs  Lab 01/31/18 1929 01/31/18 2307 02/01/18 0329 02/01/18 0840 02/01/18 1141  GLUCAP 153* 154* 133* 141* 152*   Recent Labs  Lab 01/28/18 0981 01/28/18 1449 01/29/18 0454 01/29/18 1636 01/30/18 0543 01/31/18 0440 02/01/18 0519  NA 136  --   --  137 141 143 146*  K 3.7  --   --  2.8* 3.8 3.3* 4.3  CL 97*  --   --  102 107 108 110  CO2 23  --   --  22 24 26 27   GLUCOSE 87  --   --  128* 149* 161* 154*  BUN 23  --   --  36* 40* 36* 37*  CREATININE 1.23*  --   --  1.14* 1.17* 1.01* 0.92  CALCIUM 9.1  --   --  8.2* 8.2* 8.0* 8.4*  MG 2.0 2.0 2.1 2.1  --  2.2  --   PHOS 3.5 4.0 4.3 3.4  --  3.0  --    Recent Labs  Lab 01/27/18 1553 01/28/18 0639  AST 66* 74*  ALT 38 37  ALKPHOS 66 63  BILITOT 1.1 1.7*  PROT 7.6 7.6  ALBUMIN 3.6 3.4*   Recent Labs  Lab 01/27/18 1553 01/28/18 0639 01/29/18 1636 01/30/18 0543 01/31/18 0440 02/01/18 0519  WBC 13.2* 14.2* 15.1* 14.7* 14.3* 13.8*  NEUTROABS 10.9*  --   --   --   --   --   HGB 13.4 14.5 13.0 11.8* 11.6* 11.1*  HCT 43.7 44.6 39.4 36.8 36.3 36.1  MCV 92.4 88.1 87.6 88.9 90.3 92.3  PLT 244 240 244 211 250 260   No results for input(s): CKTOTAL, CKMB, CKMBINDEX, TROPONINI in the last 168 hours. No results for input(s): LABPROT, INR in the last 72 hours. No results for input(s): COLORURINE, LABSPEC, PHURINE, GLUCOSEU, HGBUR,  BILIRUBINUR, KETONESUR, PROTEINUR, UROBILINOGEN, NITRITE, LEUKOCYTESUR in the last 72 hours.  Invalid input(s): APPERANCEUR     Component Value Date/Time   CHOL 168 01/28/2018 0639   TRIG 75 01/28/2018 0639   HDL 42 01/28/2018 0639   CHOLHDL 4.0 01/28/2018 0639   VLDL 15 01/28/2018 0639   LDLCALC 111 (H) 01/28/2018 0639   Lab Results  Component Value Date   HGBA1C 5.5 01/28/2018   No results found for: LABOPIA, COCAINSCRNUR, LABBENZ, AMPHETMU, THCU, LABBARB  No results for input(s): ETH in the last 168 hours.  I have personally reviewed the radiological images below and agree with the radiology interpretations.  Ct Angio Head W Or Wo Contrast  Result Date: 01/27/2018 CLINICAL DATA:  Code stroke. Found on the ground. Right-sided weakness. EXAM: CT ANGIOGRAPHY HEAD AND NECK TECHNIQUE: Multidetector CT imaging of the head and neck was performed using the standard protocol during bolus administration of intravenous contrast. Multiplanar CT image reconstructions and MIPs were obtained to evaluate the vascular anatomy. Carotid stenosis measurements (when applicable)  are obtained utilizing NASCET criteria, using the distal internal carotid diameter as the denominator. CONTRAST:  50mL ISOVUE-370 IOPAMIDOL (ISOVUE-370) INJECTION 76% COMPARISON:  CT earlier same day FINDINGS: CTA NECK FINDINGS Aortic arch: Aortic atherosclerosis with considerable luminal irregularity. Branching pattern of great toe cephalic vessels from the arch is normal. No origin stenosis greater than 30%. Right carotid system: Right common carotid artery is tortuous but widely patent to the bifurcation. Carotid bifurcation is free of atherosclerotic plaque or narrowing. Right cervical ICA is widely patent though tortuous. Left carotid system: Common carotid artery is tortuous but widely patent to the bifurcation. Carotid bifurcation shows minimal calcified plaque at the proximal ICA but there is no stenosis. Cervical ICA shows focal  narrowing with wall thickening and irregularity in its midportion with diameter of 2.6 mm. This is a stenosis of about 50%. The wall irregularity presumably represents plaque/thrombus and could be a source of embolus. Vertebral arteries: There is atherosclerotic plaque at both vertebral artery origins but no stenosis greater than 30%. Beyond that, both vertebral arteries are widely patent through the cervical region to the foramen magnum. Skeleton: Ordinary cervical spondylosis. Upper thoracic congenital failure of separation. Other neck: No mass or lymphadenopathy. Upper chest: Right pleural effusion. Pulmonary scarring. Question interstitial edema. Review of the MIP images confirms the above findings CTA HEAD FINDINGS Anterior circulation: Both internal carotid arteries are patent through the siphon region. On the right, there is atherosclerotic calcification but no stenosis. The anterior and middle cerebral vessels are patent. On the left, there is complete occlusion of the left M1 segment due to an embolus. Flow is present within the left anterior cerebral artery. There is poor collateral demonstration, though the contrast phase is quite early. Posterior circulation: Both vertebral arteries are patent to the basilar. Early phase contrast enhancement makes it difficult to accurately evaluate the distal vertebral arteries but I think both are sufficiently patent to the basilar. No basilar stenosis is seen. Superior cerebellar and posterior cerebral vessels are patent, though there distal branches are not yet opacified. Venous sinuses: Too early to assess venous sinuses. Anatomic variants: None other significant. Delayed phase: Not performed. Review of the MIP images confirms the above findings IMPRESSION: 1. Complete occlusion of the left M1 segment due to an embolus. Flow is present within the left anterior cerebral artery. 2. Atherosclerotic disease at both carotid bifurcations but no stenosis. 3. Focal  narrowing and irregularity of the mid cervical ICA on the left with diameter of 2.6 mm. This probably represents ulcerated soft plaque and this could be a source of embolus. 4. These results were communicated to Dr. Otelia LimesLindzen at 4:50 pmon 1/26/2020by text page via the [facility] [employer] messaging system. Electronically Signed   By: Paulina FusiMark  Shogry M.D.   On: 01/27/2018 16:54   Ct Angio Neck W Or Wo Contrast  Result Date: 01/27/2018 CLINICAL DATA:  Code stroke. Found on the ground. Right-sided weakness. EXAM: CT ANGIOGRAPHY HEAD AND NECK TECHNIQUE: Multidetector CT imaging of the head and neck was performed using the standard protocol during bolus administration of intravenous contrast. Multiplanar CT image reconstructions and MIPs were obtained to evaluate the vascular anatomy. Carotid stenosis measurements (when applicable) are obtained utilizing NASCET criteria, using the distal internal carotid diameter as the denominator. CONTRAST:  50mL ISOVUE-370 IOPAMIDOL (ISOVUE-370) INJECTION 76% COMPARISON:  CT earlier same day FINDINGS: CTA NECK FINDINGS Aortic arch: Aortic atherosclerosis with considerable luminal irregularity. Branching pattern of great toe cephalic vessels from the arch is normal. No origin stenosis greater  than 30%. Right carotid system: Right common carotid artery is tortuous but widely patent to the bifurcation. Carotid bifurcation is free of atherosclerotic plaque or narrowing. Right cervical ICA is widely patent though tortuous. Left carotid system: Common carotid artery is tortuous but widely patent to the bifurcation. Carotid bifurcation shows minimal calcified plaque at the proximal ICA but there is no stenosis. Cervical ICA shows focal narrowing with wall thickening and irregularity in its midportion with diameter of 2.6 mm. This is a stenosis of about 50%. The wall irregularity presumably represents plaque/thrombus and could be a source of embolus. Vertebral arteries: There is atherosclerotic plaque at  both vertebral artery origins but no stenosis greater than 30%. Beyond that, both vertebral arteries are widely patent through the cervical region to the foramen magnum. Skeleton: Ordinary cervical spondylosis. Upper thoracic congenital failure of separation. Other neck: No mass or lymphadenopathy. Upper chest: Right pleural effusion. Pulmonary scarring. Question interstitial edema. Review of the MIP images confirms the above findings CTA HEAD FINDINGS Anterior circulation: Both internal carotid arteries are patent through the siphon region. On the right, there is atherosclerotic calcification but no stenosis. The anterior and middle cerebral vessels are patent. On the left, there is complete occlusion of the left M1 segment due to an embolus. Flow is present within the left anterior cerebral artery. There is poor collateral demonstration, though the contrast phase is quite early. Posterior circulation: Both vertebral arteries are patent to the basilar. Early phase contrast enhancement makes it difficult to accurately evaluate the distal vertebral arteries but I think both are sufficiently patent to the basilar. No basilar stenosis is seen. Superior cerebellar and posterior cerebral vessels are patent, though there distal branches are not yet opacified. Venous sinuses: Too early to assess venous sinuses. Anatomic variants: None other significant. Delayed phase: Not performed. Review of the MIP images confirms the above findings IMPRESSION: 1. Complete occlusion of the left M1 segment due to an embolus. Flow is present within the left anterior cerebral artery. 2. Atherosclerotic disease at both carotid bifurcations but no stenosis. 3. Focal narrowing and irregularity of the mid cervical ICA on the left with diameter of 2.6 mm. This probably represents ulcerated soft plaque and this could be a source of embolus. 4. These results were communicated to Dr. Otelia Limes at 4:50 pmon 1/26/2020by text page via the [facility]  messaging system. Electronically Signed   By: Paulina Fusi M.D.   On: 01/27/2018 16:54   Ct Cervical Spine Wo Contrast  Result Date: 01/27/2018 CLINICAL DATA:  Found down. Stroke. Clinical concern for cervical spine fracture. EXAM: CT CERVICAL SPINE WITHOUT CONTRAST TECHNIQUE: Multidetector CT imaging of the cervical spine was performed without intravenous contrast. Multiplanar CT image reconstructions were also generated. COMPARISON:  Head CT obtained at the same time. FINDINGS: Alignment: Minimal reversal of the normal lordosis. No subluxations. Skull base and vertebrae: No acute fracture. No primary bone lesion or focal pathologic process. Soft tissues and spinal canal: No prevertebral fluid or swelling. No visible canal hematoma. Disc levels:  Multilevel degenerative changes. Upper chest: Minimal biapical bullous changes. Small to moderate-sized right pleural effusion. Other: Endotracheal and orogastric tubes are in place. Small right lobe thyroid nodule measuring 6 mm in maximum diameter. Minimal carotid artery calcification on the left. IMPRESSION: 1. No fracture or subluxation. 2. Minimal reversal of the normal lordosis. 3. Multilevel degenerative changes. 4. Small to moderate-sized right pleural effusion. 5. Minimal left carotid artery atheromatous calcification. 6. 6 mm right lobe thyroid nodule. This is  too small to characterize, but most likely benign in the absence of known clinical risk factors for thyroid carcinoma. Electronically Signed   By: Beckie Salts M.D.   On: 01/27/2018 16:46   Mr Brain Wo Contrast  Result Date: 01/28/2018 CLINICAL DATA:  Found unconscious at home.  MCA occlusion. EXAM: MRI HEAD WITHOUT CONTRAST TECHNIQUE: Multiplanar, multiecho pulse sequences of the brain and surrounding structures were obtained without intravenous contrast. COMPARISON:  CTA head neck 01/27/2018 FINDINGS: BRAIN: Large area of ischemic infarct within the left MCA territory, involving most of the  frontal operculum and insula, as well as parts of the left basal ganglia and left temporal lobe. The midline structures are normal. There is moderate edema throughout the ischemic territory. Early confluent hyperintense T2-weighted signal of the periventricular and deep white matter, most commonly due to chronic ischemic microangiopathy. There is petechial hemorrhage throughout the infarcted area. There is laminar necrosis at a site of remote infarct in the left occipital lobe. VASCULAR: Major intracranial arterial and venous sinus flow voids are normal. SKULL AND UPPER CERVICAL SPINE: Right frontal scalp hematoma. SINUSES/ORBITS: No fluid levels or advanced mucosal thickening. No mastoid or middle ear effusion. The orbits are normal. IMPRESSION: 1. Large ischemic infarct within the left MCA territory with confluent petechial hemorrhage throughout the affected area Heidelberg classification 1b: HI2, confluent petechiae, no mass effect. 2. No midline shift or hydrocephalus. Electronically Signed   By: Deatra Robinson M.D.   On: 01/28/2018 04:50   Ct Head Code Stroke Wo Contrast  Result Date: 01/27/2018 CLINICAL DATA:  Code stroke. Last seen normal 2100 hours yesterday. Flaccid on the right side. EXAM: CT HEAD WITHOUT CONTRAST TECHNIQUE: Contiguous axial images were obtained from the base of the skull through the vertex without intravenous contrast. COMPARISON:  None. FINDINGS: Brain: Brainstem and cerebellum are unremarkable except for chronic small-vessel ischemic changes of the pons. Old infarction in the left occipital lobe. Acute infarction in the left hemisphere in the MCA territory affecting the insula, deep white matter and cortical brain in 3 middle cerebral artery sectors. No evidence of hemorrhage. No mass effect or shift. Chronic small-vessel ischemic changes affect the white matter elsewhere. Vascular: Hyperdense left MCA in the distal M1/bifurcation region. Skull: Negative Sinuses/Orbits: Clear/normal  Other: None ASPECTS (Alberta Stroke Program Early CT Score) - Ganglionic level infarction (caudate, lentiform nuclei, internal capsule, insula, M1-M3 cortex): 4 - Supraganglionic infarction (M4-M6 cortex): 2 Total score (0-10 with 10 being normal): 6 IMPRESSION: 1. Acute infarction in the left MCA territory affecting the insula, deep white matter and cortical brain in 3 middle cerebral artery sectors. No hemorrhage or mass effect. 2. ASPECTS is 6. 3. Hyperdense left MCA. *This report was discussed by telephone with Dr. Otelia Limes at 1621 hours. Electronically Signed   By: Paulina Fusi M.D.   On: 01/27/2018 16:31    PHYSICAL EXAM  Temp:  [97.6 F (36.4 C)-99.9 F (37.7 C)] 97.6 F (36.4 C) (01/31 0809) Pulse Rate:  [41-162] 91 (01/31 1135) Resp:  [0-29] 25 (01/31 1135) BP: (103-148)/(53-137) 130/66 (01/31 1135) SpO2:  [92 %-100 %] 96 % (01/31 1136) FiO2 (%):  [30 %] 30 % (01/31 1136) Weight:  [72.9 kg] 72.9 kg (01/31 0400)  General - Well nourished, well developed, intubated off sedation  Ophthalmologic - fundi not visualized due to noncooperation.  Cardiovascular - irregularly irregular heart rate and rhythm.  Neuro - intubated off sedation, eyes open, seems able to follow commands on eye closed and opening, however not  following any other commands. Eyes in left gaze position, right neglect, not consistent blinking to visual threat bilaterally, doll's eyes present, not tracking, PERRL. Corneal reflex present bilaterally, gag and cough present. Breathing over the vent.  Facial symmetry not able to test due to ET tube.  Tongue midline in mouth. On pain stimulation, slight withdraw of RUE and RLE, left arm can hold against gravity, LLE mild withdraw but able to wiggle toes spontaneously. DTR 1+ and positive babinski on the right. Sensation, coordination and gait not tested.   ASSESSMENT/PLAN Ms. Caprice Kelli Vazquez is a 85 y.o. female spanish speaking only with no documented history but seems to have  afib and CHF and HTN admitted for found down at home, unresponsive, with right side flaccid. Intubated in ED. No tPA given due to outside window and established infarct.   Stroke:  left large MCA infarct embolic pattern secondary to afib not on AC  CT head - old left PCA infarct, acute left MCA large infarct  MRI  Left large MCA infarct with hemorrhagic conversion, old left PCA infarct  CTA head and neck - left M1 occlusion  2D Echo EF 55 to 60%  CT repeat 01/29/18 increased mass effect with MLS 2mm and hemorrhagic conversion unchanged  LDL 111  HgbA1c 5.5  Heparin subq for VTE prophylaxis  NPO  aspirin 81 mg daily prior to admission, now on ASA 81. No anticoagulation for now given significant hemorrhagic conversion.  Ongoing aggressive stroke risk factor management  Therapy recommendations:  Pending   Disposition:  Pending, discussed with daughter today and she stated that family would like continue intubation for now, and consider next week one-way extubation without re-intubation.  Afib  Home meds including cardizem  EKG on admission confirmed afib  On ASA at home  Likely the cause of current stroke  Rate under control  On ASA 81mg   No anticoagulation for now due to significant hemorrhagic conversion  CHF  TTE EF 55 to 60%  Has metolazone PTA  Daughter confirmed fluid overload in the past  CCM on board  Gentle hydration, on TF  Respiratory failure  Intubated off sedation  CCM on board  on weaning trials, tolerating  Family would like one way extubation next week  Hypertension . Stable  BP goal < 160 due to hemorrhagic conversion  Off Neo-Synephrine  Hyperlipidemia  Home meds:  none   LDL 111, goal < 70  Now on lipitor 20  Continue statin at discharge  Other Stroke Risk Factors  Advanced age  Hx of stroke on image - left PCA old infarct on CT and MRI  Other Active Problems  CKD stage II - Cre 1.23->1.17 - gentle hydration  with TF ->1.01->0.92  Leukocytosis - WBC 14.2->14.7->14.3 - afebrile ->13.8  Hospital day # 5  This patient is critically ill due to left MCA large infarct, afib not on AC, hemorrhagic conversion, CHF and at significant risk of neurological worsening, death form recurrent stroke, cerebral edema, brain herniation, heart failure, seizure. This patient's care requires constant monitoring of vital signs, hemodynamics, respiratory and cardiac monitoring, review of multiple databases, neurological assessment, discussion with family, other specialists and medical decision making of high complexity. I spent 40 minutes of neurocritical care time in the care of this patient. I had long discussion with daughter at bedside, updated pt current condition, treatment plan and potential poor prognosis. She would like one way extubation next week. Discussed with Dr. Kennis CarinaMannam.   Taiven Greenley, MD PhD Stroke  Neurology 02/01/2018 12:02 PM    To contact Stroke Continuity provider, please refer to [email or web address]. After hours, contact General Neurology

## 2018-02-01 NOTE — Progress Notes (Signed)
.   NAMCaprice Renshaw:  Kelli Vazquez, MRN:  [id number], DOB:  April 24, 1929, LOS: 5 ADMISSION DATE:  01/27/2018, CONSULTATION DATE:  1/26  REFERRING MD:  Mesner, CHIEF COMPLAINT:  Acute encephalopathy   Brief History   85 y/o female admitted on 1/26 with embolic left MCA stroke.   Past Medical History  HTN, CHF  Significant Hospital Events   Admission/intubation 1/26  Consults:  PCCM Neurology  Procedures:   1/26 intubated>>  Significant Diagnostic Tests:  CTA 1/26: 1. Complete occlusion of the left M1 segment due to an embolus. Focal narrowing and irregularity of the mid cervical ICA on the left with diameter of 2.6 mm. This probably represents ulcerated soft plaque and this could be a source of embolus. MRI brain 1/27> large ischemic left MCA stroke, no midline shift ECHO 1/27> EF 55-65%, moderate LVH pattern CT head 1/28> evolving L MCA infarct with new 2mm midline shift  CXR 1/31- bilateral infiltrates and bilateral pleural effusions  Micro Data:  1/26 MRSA> neg  Antimicrobials:   none  Interim history/subjective:  SBT this AM, remains off sedation and is still not following commands   Objective   Blood pressure 136/78, pulse 77, temperature 97.6 F (36.4 C), temperature source Axillary, resp. rate (!) 22, height 5\' 3"  (1.6 m), weight 72.9 kg, SpO2 98 %.    Vent Mode: PSV;CPAP FiO2 (%):  [30 %] 30 % Set Rate:  [20 bmp] 20 bmp Vt Set:  [320 mL] 320 mL PEEP:  [5 cmH20] 5 cmH20 Pressure Support:  [15 cmH20] 15 cmH20 Plateau Pressure:  [15 cmH20-17 cmH20] 15 cmH20   Intake/Output Summary (Last 24 hours) at 02/01/2018 0933 Last data filed at 02/01/2018 0600 Gross per 24 hour  Intake 1933.74 ml  Output 940 ml  Net 993.74 ml   Filed Weights   01/30/18 0500 01/31/18 0500 02/01/18 0400  Weight: 71 kg 71.3 kg 72.9 kg    Examination:  General:  Elderly adult female, intubated, NAD HENT: NCAT ETT secure trachea midline MMM  PULM: mechanically ventilated. Diminished bibasilar  lung sounds. Symmetrical chest expansion  CV: Irregular rhythm. No r/g/m. Radial pulses 2+ pedal pulses 1+  GI: soft, round, ND, normoactive x4 MSK: RUE depended edema. No obvious joint deformity.  Neuro: Does not open eyes to stimulation. Grimace to noxious stimuli. Moves LUE spontaneously.   Resolved Hospital Problem list    Hypotension, AKI   Assessment & Plan:   Acute respiratory failure with hypoxia requiring mechanical ventilation  -CXR 1/31 bilateral infiltrates and bilateral pleural effusions, likely related to CHF  P Continue mechanical ventilation -AM SBT -Change to PRVC when needed, titrate PEEP/FiO2 for SpO2> 94% -Continue VAP prevention -AM CXR  -Palliative care consulted per family request for GOC, to include discussion of if trach appropriate for patient   Acute L MCA CVA with midline shift -old L PCA infarct -poor prognosis P -per neurology, appreciate recs  -SQ Heparin, per neurology -No antithrombotic due to hemorrhagic conversion, per neurology  -SBP goal < 160 per neurology given hemorrhagic conversion   Atrial fibrilation P -Continue tele -rate under 100, no need for rate control   Chronic CHF EF 55-60% Home metolazone -CXR 1/31 signs of fluid overload P Continue Strict I/O and maintain foley for aggressive diuresis  IVF KVO Lasix x1 today   Hyperglycemia P Continue to check q4hr Start SSI if > 180   Leukocytosis -possibly reactive due to critical illness, no fever, WBC down-trending now 13.8 P -Trend WBC, fever, HR,  BP  -If signs of SIRS response/sepsis, can consider infectious workup however  Malnutrition, at risk P -continue EN   Constipation P -continue miralax, colace -PRN dulcolax  Goals of Care P -Palliative care consulted per family request -Differing opinions between family members RE trach/peg    Best practice:  Diet: Enteral Nutrition Pain/Anxiety/Delirium protocol (if indicated): PAD RASS goal 0 to -1 VAP  protocol (if indicated): Yes DVT prophylaxis: SCD, sq Heparin GI prophylaxis: Pantoprazole  Glucose control: Monitor CBG Mobility: bed rest Code Status: full Family Communication: daughter at bedside, affirmed to daughter that palliative medicine consulted per family request Disposition: Continue ICU care  Labs   CBC: Recent Labs  Lab 01/27/18 1553 01/28/18 0639 01/29/18 1636 01/30/18 0543 01/31/18 0440 02/01/18 0519  WBC 13.2* 14.2* 15.1* 14.7* 14.3* 13.8*  NEUTROABS 10.9*  --   --   --   --   --   HGB 13.4 14.5 13.0 11.8* 11.6* 11.1*  HCT 43.7 44.6 39.4 36.8 36.3 36.1  MCV 92.4 88.1 87.6 88.9 90.3 92.3  PLT 244 240 244 211 250 260    Basic Metabolic Panel: Recent Labs  Lab 01/28/18 0639 01/28/18 1449 01/29/18 0454 01/29/18 1636 01/30/18 0543 01/31/18 0440 02/01/18 0519  NA 136  --   --  137 141 143 146*  K 3.7  --   --  2.8* 3.8 3.3* 4.3  CL 97*  --   --  102 107 108 110  CO2 23  --   --  22 24 26 27   GLUCOSE 87  --   --  128* 149* 161* 154*  BUN 23  --   --  36* 40* 36* 37*  CREATININE 1.23*  --   --  1.14* 1.17* 1.01* 0.92  CALCIUM 9.1  --   --  8.2* 8.2* 8.0* 8.4*  MG 2.0 2.0 2.1 2.1  --  2.2  --   PHOS 3.5 4.0 4.3 3.4  --  3.0  --    GFR: Estimated Creatinine Clearance: 42 mL/min (by C-G formula based on SCr of 0.92 mg/dL). Recent Labs  Lab 01/29/18 1636 01/30/18 0543 01/31/18 0440 02/01/18 0519  WBC 15.1* 14.7* 14.3* 13.8*    Liver Function Tests: Recent Labs  Lab 01/27/18 1553 01/28/18 0639  AST 66* 74*  ALT 38 37  ALKPHOS 66 63  BILITOT 1.1 1.7*  PROT 7.6 7.6  ALBUMIN 3.6 3.4*   No results for input(s): LIPASE, AMYLASE in the last 168 hours. No results for input(s): AMMONIA in the last 168 hours.  ABG    Component Value Date/Time   PHART 7.583 (H) 01/28/2018 0615   PCO2ART 25.8 (L) 01/28/2018 0615   PO2ART 92.3 01/28/2018 0615   HCO3 24.4 01/28/2018 0615   O2SAT 97.6 01/28/2018 0615     Coagulation Profile: Recent Labs    Lab 01/27/18 1553  INR 1.12    Cardiac Enzymes: No results for input(s): CKTOTAL, CKMB, CKMBINDEX, TROPONINI in the last 168 hours.  HbA1C: Hgb A1c MFr Bld  Date/Time Value Ref Range Status  01/28/2018 06:39 AM 5.5 4.8 - 5.6 % Final    Comment:    (NOTE) Pre diabetes:          5.7%-6.4% Diabetes:              >6.4% Glycemic control for   <7.0% adults with diabetes     CBG: Recent Labs  Lab 01/31/18 1525 01/31/18 1929 01/31/18 2307 02/01/18 0329 02/01/18 0840  GLUCAP  155* 153* 154* 133* 141*     Critical care time: 30 minutes     Tessie FassGrace Kelijah Towry MSN, AGACNP-BC [facility] 02/01/2018, 9:53 AM

## 2018-02-01 NOTE — Consult Note (Signed)
Consultation Note Date: 02/01/2018   Patient Name: Kelli Vazquez  DOB: [birth year]  MRN: [id number]  Age / Sex: 85 y.o., female  PCP: No primary care provider on file. Referring Physician: Marshell Garfinkel, MD  Reason for Consultation: Establishing goals of care and Psychosocial/spiritual support  HPI/Patient Profile: 85 y.o. female  with past medical history of hypertension, congestive heart failure, atrial fib, history of CVA admitted on 01/27/2018 with cute MCA infarct.  Patient lives alone, and was found down on her floor the next morning.  She opens her eyes to voice and stimulation but is inconsistently following commands.  Consult ordered for goals of care in the setting of CVA, respiratory failure.   Clinical Assessment and Goals of Care: Patient seen, chart reviewed.  Patient's daughter Kelli Vazquez is at the bedside.  Kelli Vazquez shows good understanding of what is going on with her mother.  She states "we", have decided no surgery specifically no tracheostomy and no facility.  I am not clear if she is speaking for her other sibling, that they have  reached a consensus or not (prior notes have indicated a family member has wanted all aggressive care including trach/PEG/nursing home).   Kelli Vazquez verbalizes that her mother would not want to live incapacitated in a nursing home.  That if she is not able to breathe on her own they will pursue one-way extubation next week when faced with that issue.  She does ask about feeding tubes and we did discuss permanent feeding tubes, risks and benefits.  I also mentioned to Kelli Vazquez that if patient does not wean well and cannot support her respiratory status, feeding should proceed to comfort measures.  We also discussed CODE STATUS.  I defined full code as well as DNR.  Kelli Vazquez presently wishes everything to be done for her until we see how she continues to wean; or if this is a to be a one  way  extubation anticipating death to soon follow  Patient cannot speak for herself.  Her healthcare proxy's are her daughter's Kelli Vazquez, and Kelli Vazquez    SUMMARY OF RECOMMENDATIONS   Continue full code for now Palliative medicine to stay involved and to continue to monitor, support patient and family as we see how she progresses in terms of weaning Per daughter Kelli Vazquez (this is not per a family meeting), she states that they would not pursue trach or nursing home.  I am not clear from our initial meeting whether she would pursue a PEG tube . PM to arrange family meeting to ensure that we have a consensus regarding [place] Code Status/Advance Care Planning:  Full code    Symptom Management:   Pain: Continue with fentanyl IV 50 g every 2 hours as needed  Palliative Prophylaxis:   Aspiration, Bowel Regimen, Delirium Protocol, Eye Care, Frequent Pain Assessment, Oral Care, Palliative Wound Care and Turn Reposition  Additional Recommendations (Limitations, Scope, Preferences):  Full Scope Treatment except for trach  Psycho-social/Spiritual:   Desire for further Chaplaincy support:no  Additional Recommendations: Referral to [employer]  Prognosis:   Unable to determine  Discharge Planning: To Be Determined      Primary Diagnoses: Present on Admission: . Acute ischemic left MCA stroke ([place])   I have reviewed the medical record, interviewed the patient and family, and examined the patient. The following aspects are pertinent.  No past medical history on file. Social History   Socioeconomic History  . Marital status: Not on file    Spouse name: Not on file  . Number of children: Not on file  . Years of education: Not on file  . Highest education level: Not on file  Occupational History  . Not on file  Social Needs  . Financial resource strain: Not on file  . Food insecurity:    Worry: Not on file    Inability: Not on file  . Transportation needs:    Medical: Not on  file    Non-medical: Not on file  Tobacco Use  . Smoking status: Not on file  Substance and Sexual Activity  . Alcohol use: Not on file  . Drug use: Not on file  . Sexual activity: Not on file  Lifestyle  . Physical activity:    Days per week: Not on file    Minutes per session: Not on file  . Stress: Not on file  Relationships  . Social connections:    Talks on phone: Not on file    Gets together: Not on file    Attends religious service: Not on file    Active member of club or organization: Not on file    Attends meetings of clubs or organizations: Not on file    Relationship status: Not on file  Other Topics Concern  . Not on file  Social History Narrative  . Not on file   No family history on file. Scheduled Meds: .  stroke: mapping our early stages of recovery book   Does not apply Once  . aspirin  81 mg Per Tube Daily  . atorvastatin  20 mg Oral q1800  . chlorhexidine gluconate (MEDLINE KIT)  15 mL Mouth Rinse BID  . docusate  100 mg Per Tube BID  . feeding supplement (VITAL HIGH PROTEIN)  1,000 mL Per Tube Q24H  . heparin injection (subcutaneous)  5,000 Units Subcutaneous Q8H  . mouth rinse  15 mL Mouth Rinse 10 times per day  . pantoprazole sodium  40 mg Per Tube Daily  . polyethylene glycol  17 g Per Tube BID   Continuous Infusions: . sodium chloride 10 mL/hr at 02/01/18 1300  . phenylephrine (NEO-SYNEPHRINE) Adult infusion Stopped (01/29/18 1215)   PRN Meds:.acetaminophen **OR** acetaminophen (TYLENOL) oral liquid 160 mg/5 mL **OR** acetaminophen, bisacodyl, fentaNYL (SUBLIMAZE) injection, fentaNYL (SUBLIMAZE) injection Medications Prior to Admission:  Prior to Admission medications   Medication Sig Start Date End Date Taking? Authorizing Provider  aspirin EC 81 MG tablet Take 81 mg by mouth daily.   Yes [provider]  diltiazem (DILACOR XR) 120 MG 24 hr capsule Take 120 mg by mouth daily.   Yes [provider]  meclizine (ANTIVERT) 12.5  MG tablet Take 12.5 mg by mouth 3 (three) times daily as needed for dizziness.   Yes [provider]  metolazone (ZAROXOLYN) 2.5 MG tablet Take 2.5 mg by mouth every 7 (seven) days.   Yes [provider]  potassium chloride (K-DUR,KLOR-CON) 10 MEQ tablet Take 20 mEq by mouth daily.   Yes [provider]   Allergies  Allergen Reactions  .  Latex Rash  . Penicillins Rash    Did it involve swelling of the face/tongue/throat, SOB, or low BP? No Did it involve sudden or severe rash/hives, skin peeling, or any reaction on the inside of your mouth or nose? No Did you need to seek medical attention at a hospital or doctor's office? No When did it last happen? If all above answers are "NO", may proceed with cephalosporin use.   Review of Systems  Unable to perform ROS: Intubated    Physical Exam Vitals signs and nursing note reviewed.  Constitutional:      Appearance: She is ill-appearing.  HENT:     Head: Normocephalic and atraumatic.  Pulmonary:     Comments: On ventilator Breathing above the vent but thus far has required some degree of pressure support when weaning Genitourinary:    Comments: Foley Skin:    General: Skin is warm and dry.  Neurological:     Comments: Right hemiplegia  Psychiatric:     Comments: No overt agitation otherwise unable to test mental status     Vital Signs: BP 134/70   Pulse 93   Temp 97.6 F (36.4 C) (Axillary)   Resp (!) 26   Ht 5' 3"  (1.6 m)   Wt 72.9 kg   SpO2 100%   BMI 28.47 kg/m  Pain Scale: CPOT       SpO2: SpO2: 100 % O2 Device:SpO2: 100 % O2 Flow Rate: .   IO: Intake/output summary:   Intake/Output Summary (Last 24 hours) at 02/01/2018 1542 Last data filed at 02/01/2018 1300 Gross per 24 hour  Intake 1971.53 ml  Output 900 ml  Net 1071.53 ml    LBM: Last BM Date: 02/01/18 Baseline Weight: Weight: 68 kg Most recent weight: Weight: 72.9 kg     Palliative Assessment/Data:   Flowsheet  Rows     Most Recent Value  Intake Tab  Referral Department  Critical care  Unit at Time of Referral  ICU  Palliative Care Primary Diagnosis  Neurology  Date Notified  01/31/18  Palliative Care Type  New Palliative care  Reason for referral  Clarify Goals of Care  Date of Admission  01/27/18  Date first seen by Palliative Care  02/01/18  # of days Palliative referral response time  1 Day(s)  # of days IP prior to Palliative referral  4  Clinical Assessment  Palliative Performance Scale Score  30%  Pain Max last 24 hours  Not able to report  Pain Min Last 24 hours  Not able to report  Dyspnea Max Last 24 Hours  Not able to report  Dyspnea Min Last 24 hours  Not able to report  Nausea Max Last 24 Hours  Not able to report  Nausea Min Last 24 Hours  Not able to report  Anxiety Max Last 24 Hours  Not able to report  Anxiety Min Last 24 Hours  Not able to report  Other Max Last 24 Hours  Not able to report  Psychosocial & Spiritual Assessment  Palliative Care Outcomes  Patient/Family meeting held?  Yes  Who was at the meeting?  dtr [place]  Provided psychosocial or spiritual support  Patient/Family wishes: Interventions discontinued/not started   [place] follow-up planned  Yes, Facility      Time In: 1400 Time Out: 1510 Time Total: 70 min Greater than 50%  of this time was spent counseling and coordinating care related to the above assessment and  plan.  Signed by: Dory Horn, NP   Please contact Palliative Medicine Team phone at [phone] for questions and concerns.  For individual provider: See Shea Evans

## 2018-02-02 ENCOUNTER — Inpatient Hospital Stay (HOSPITAL_COMMUNITY): Payer: Medicare Other

## 2018-02-02 DIAGNOSIS — Z7189 Other specified counseling: Secondary | ICD-10-CM

## 2018-02-02 DIAGNOSIS — Z9911 Dependence on respirator [ventilator] status: Secondary | ICD-10-CM

## 2018-02-02 DIAGNOSIS — D72829 Elevated white blood cell count, unspecified: Secondary | ICD-10-CM

## 2018-02-02 DIAGNOSIS — J9690 Respiratory failure, unspecified, unspecified whether with hypoxia or hypercapnia: Secondary | ICD-10-CM

## 2018-02-02 LAB — BASIC METABOLIC PANEL
Anion gap: 14 (ref 5–15)
BUN: 44 mg/dL — ABNORMAL HIGH (ref 8–23)
CO2: 27 mmol/L (ref 22–32)
Calcium: 8.8 mg/dL — ABNORMAL LOW (ref 8.9–10.3)
Chloride: 106 mmol/L (ref 98–111)
Creatinine, Ser: 1 mg/dL (ref 0.44–1.00)
GFR calc Af Amer: 59 mL/min — ABNORMAL LOW (ref >60)
GFR, EST NON AFRICAN AMERICAN: 51 mL/min — AB (ref >60)
Glucose, Bld: 160 mg/dL — ABNORMAL HIGH (ref 70–99)
POTASSIUM: 3.5 mmol/L (ref 3.5–5.1)
Sodium: 147 mmol/L — ABNORMAL HIGH (ref 135–145)

## 2018-02-02 LAB — CBC
HCT: 38.3 % (ref 36.0–46.0)
Hemoglobin: 11.7 g/dL — ABNORMAL LOW (ref 12.0–15.0)
MCH: 28.2 pg (ref 26.0–34.0)
MCHC: 30.5 g/dL (ref 30.0–36.0)
MCV: 92.3 fL (ref 80.0–100.0)
Platelets: 308 10*3/uL (ref 150–400)
RBC: 4.15 MIL/uL (ref 3.87–5.11)
RDW: 16.1 % — ABNORMAL HIGH (ref 11.5–15.5)
WBC: 16.1 10*3/uL — ABNORMAL HIGH (ref 4.0–10.5)
nRBC: 0 % (ref 0.0–0.2)

## 2018-02-02 LAB — GLUCOSE, CAPILLARY
GLUCOSE-CAPILLARY: 151 mg/dL — AB (ref 70–99)
GLUCOSE-CAPILLARY: 147 mg/dL — AB (ref 70–99)
Glucose-Capillary: 157 mg/dL — ABNORMAL HIGH (ref 70–99)
Glucose-Capillary: 143 mg/dL — ABNORMAL HIGH (ref 70–99)
Glucose-Capillary: 152 mg/dL — ABNORMAL HIGH (ref 70–99)
Glucose-Capillary: 149 mg/dL — ABNORMAL HIGH (ref 70–99)

## 2018-02-02 MED ORDER — FUROSEMIDE 10 MG/ML IJ SOLN
20.0000 mg | Freq: Every day | INTRAMUSCULAR | Status: DC
  Administered 2018-02-02 – 2018-02-05 (×4): 20 mg via INTRAVENOUS
  Filled 2018-02-02 (×4): qty 2

## 2018-02-02 NOTE — Progress Notes (Signed)
  Patient seen, chart reviewed.  Patient's daughter, Marylu Lund, is at the bedside.  Discussed further with Marylu Lund whether goals of care that were shared with me on 02/01/2018, if she and her sister Colin Broach, were in agreement.  She states  when this initially happened, they were very overwhelmed and both were feeling as though they should proceed with trach PEG and nursing home.  Upon further discussions with doctors and amongst themselves, they have decided the primary goal is to take her home, no trach, no facility.  Issues regarding feeding remain undetermined as we do not quite know whether patient will wean on her own or not or will require a one-way extubation for comfort.  Daughter does acknowledge that her mother likely would not want a feeding tube but this is not been firmly decided by the family.  Primary decision makers are patient's daughters, Marylu Lund and Colin Broach.  They all live together.  There is one brother who is still living in the [place]; patient has 1 daughter that is deceased from cancer  Dr. Daisy Blossom present for part of visit..  Goals of continuing full code and full support until time to make a decision regarding removal of ventilator.  Daughter is also asking whether further weaning in terms of pressure support will happen today; patient is currently on 15 of pressure support.  RN present agreed to touch base with respiratory therapy in terms of weaning protocol  Palliative medicine to stay involved in support and monitor patient and family  Thank you, Eduard Roux, NP Time spent: 20 minutes Greater than 50% of this visit was spent in counseling and coordination of care

## 2018-02-02 NOTE — Progress Notes (Signed)
.   NAMCaprice Vazquez:  Kelli Vazquez, MRN:  [id number], DOB:  [birth year], LOS: 6 ADMISSION DATE:  01/27/2018, CONSULTATION DATE:  1/26  REFERRING MD:  Mesner, CHIEF COMPLAINT:  Acute encephalopathy   Brief History   85 y/o female admitted on 1/26 with embolic left MCA stroke.  Past Medical History  HTN, CHF  Significant Hospital Events   Admission/intubation 1/26  Consults:  PCCM Neurology  Procedures:  1/26 intubated>>  Significant Diagnostic Tests:  CTA 1/26: 1. Complete occlusion of the left M1 segment due to an embolus. Focal narrowing and irregularity of the mid cervical ICA on the left with diameter of 2.6 mm. This probably represents ulcerated soft plaque and this could be a source of embolus. MRI brain 1/27> large ischemic left MCA stroke, no midline shift ECHO 1/27> EF 55-65%, moderate LVH pattern CT head 1/28> evolving L MCA infarct with new 2mm midline shift CXR 1/31- bilateral infiltrates and bilateral pleural effusions CXR 2/1 - BL vascular congestion ETT 1 cm from carina   Micro Data:  1/26 MRSA> neg  Antimicrobials:  None  Interim history/subjective:  No issues overnight.  Intubated sedated on mechanical ventilation.  Spoke with daughter at bedside this morning.  Family is very appreciative of care.  She did make comments to me this morning that she did not think her mother would want to be on a breathing machine permanently.  Objective   Blood pressure 130/73, pulse 92, temperature 98.3 F (36.8 C), temperature source Oral, resp. rate (!) 23, height 5\' 3"  (1.6 m), weight 73.1 kg, SpO2 98 %.    Vent Mode: PRVC FiO2 (%):  [30 %] 30 % Set Rate:  [20 bmp] 20 bmp Vt Set:  [320 mL] 320 mL PEEP:  [5 cmH20] 5 cmH20 Pressure Support:  [15 cmH20] 15 cmH20 Plateau Pressure:  [14 cmH20-16 cmH20] 14 cmH20   Intake/Output Summary (Last 24 hours) at 02/02/2018 0816 Last data filed at 02/02/2018 0800 Gross per 24 hour  Intake 1517.5 ml  Output 3000 ml  Net -1482.5 ml   Filed  Weights   01/31/18 0500 02/01/18 0400 02/02/18 0500  Weight: 71.3 kg 72.9 kg 73.1 kg    Examination:  General appearance: 85 y.o., female, elderly female, intubated on mechanical ventilation Eyes: anicteric sclerae, moist conjunctivae; pupils reactive to light, 6 mm HENT: NCAT; oropharynx, MMM endotracheal tube in place Neck: Trachea midline; no JVD Lungs: Lateral ventilated breath sounds, no crackles, no wheeze CV: Irregular, rate controlled, S1-S2, no MRG Abdomen: Soft, non-tender; non-distended, BS present  Extremities: Edema in the right upper extremity, mostly dependent locations mild edema in the lateral flanks Skin: Normal temperature, turgor and texture; no rash Neuro: Is on occasion alert to voice.  Significantly sedated this morning.  Pupils reactive to light    Resolved Hospital Problem list    Hypotension, AKI   Assessment & Plan:   Acute respiratory failure with hypoxia requiring mechanical ventilation  -CXR 1/31 bilateral infiltrates and bilateral pleural effusions, likely related to CHF  P Patient remains in the intensive care unit on mechanical ventilation and full life support. Unable to wean from mechanical ventilation due to patient's poor mental status and likely inability to protect her airway. At this time we will continue SBT with pressure support CPAP as tolerated. At this point too sedated this morning to try that at this time. Continue VAT protocol Agree with ongoing palliative care discussions as patient will likely need a permanent tracheostomy and vent support due to her  neurologic injury.  Acute L MCA CVA with midline shift -old L PCA infarct -poor prognosis P We appreciate recommendations and input per neurology service Subcu heparin per neurology Started low-dose aspirin per neurology as well as statin Goal SBP less than 160.  Atrial fibrilation P Rate control with a bpm less than 100 No anticoagulation due to risk of hemorrhagic  conversion  Acute on chronic diastolic heart failure EF 55-60% -CXR 1/31 signs of fluid overload P We will continue to follow as he knows Started low-dose Lasix Continue home dose metolazone  Hyperglycemia P CBGs every 4 Goal glucose 150-180  Leukocytosis, continuing to increase P We will continue to watch for sepsis markers White blood cell count is continuing to increase slowly. Repeat chest x-ray with no obvious infiltrate. The patient's images have been independently reviewed by me.   We will hold off antibiotics at this time.  Malnutrition, at risk P Continue tube feed protocol  Constipation P MiraLAX Colace  Goals of Care P We appreciate palliative care consultation and input as the family works through this difficult time. I did have a discussion with the patient's daughter at bedside this morning.  She did state to me that she did not believe her mother would want prolonged mechanical ventilatory support.   Best practice:  Diet: Enteral Nutrition Pain/Anxiety/Delirium protocol (if indicated): PAD RASS goal 0 to -1 VAP protocol (if indicated): Yes DVT prophylaxis: SCD, sq Heparin GI prophylaxis: Pantoprazole  Glucose control: Monitor CBG Mobility: bed rest Code Status: full Family Communication: I discussed with the patient's daughter at bedside this morning. Disposition: Continue ICU care  Labs   CBC: Recent Labs  Lab 01/27/18 1553  01/29/18 1636 01/30/18 0543 01/31/18 0440 02/01/18 0519 02/02/18 0609  WBC 13.2*   < > 15.1* 14.7* 14.3* 13.8* 16.1*  NEUTROABS 10.9*  --   --   --   --   --   --   HGB 13.4   < > 13.0 11.8* 11.6* 11.1* 11.7*  HCT 43.7   < > 39.4 36.8 36.3 36.1 38.3  MCV 92.4   < > 87.6 88.9 90.3 92.3 92.3  PLT 244   < > 244 211 250 260 308   < > = values in this interval not displayed.    Basic Metabolic Panel: Recent Labs  Lab 01/28/18 0639 01/28/18 1449 01/29/18 0454 01/29/18 1636 01/30/18 0543 01/31/18 0440  02/01/18 0519 02/02/18 0609  NA 136  --   --  137 141 143 146* 147*  K 3.7  --   --  2.8* 3.8 3.3* 4.3 3.5  CL 97*  --   --  102 107 108 110 106  CO2 23  --   --  22 24 26 27 27   GLUCOSE 87  --   --  128* 149* 161* 154* 160*  BUN 23  --   --  36* 40* 36* 37* 44*  CREATININE 1.23*  --   --  1.14* 1.17* 1.01* 0.92 1.00  CALCIUM 9.1  --   --  8.2* 8.2* 8.0* 8.4* 8.8*  MG 2.0 2.0 2.1 2.1  --  2.2  --   --   PHOS 3.5 4.0 4.3 3.4  --  3.0  --   --    GFR: Estimated Creatinine Clearance: 38.7 mL/min (by C-G formula based on SCr of 1 mg/dL). Recent Labs  Lab 01/30/18 0543 01/31/18 0440 02/01/18 0519 02/02/18 0609  WBC 14.7* 14.3* 13.8* 16.1*  Liver Function Tests: Recent Labs  Lab 01/27/18 1553 01/28/18 0639  AST 66* 74*  ALT 38 37  ALKPHOS 66 63  BILITOT 1.1 1.7*  PROT 7.6 7.6  ALBUMIN 3.6 3.4*   No results for input(s): LIPASE, AMYLASE in the last 168 hours. No results for input(s): AMMONIA in the last 168 hours.  ABG    Component Value Date/Time   PHART 7.583 (H) 01/28/2018 0615   PCO2ART 25.8 (L) 01/28/2018 0615   PO2ART 92.3 01/28/2018 0615   HCO3 24.4 01/28/2018 0615   O2SAT 97.6 01/28/2018 0615     Coagulation Profile: Recent Labs  Lab 01/27/18 1553  INR 1.12    Cardiac Enzymes: No results for input(s): CKTOTAL, CKMB, CKMBINDEX, TROPONINI in the last 168 hours.  HbA1C: Hgb A1c MFr Bld  Date/Time Value Ref Range Status  01/28/2018 06:39 AM 5.5 4.8 - 5.6 % Final    Comment:    (NOTE) Pre diabetes:          5.7%-6.4% Diabetes:              >6.4% Glycemic control for   <7.0% adults with diabetes     CBG: Recent Labs  Lab 02/01/18 1542 02/01/18 1935 02/01/18 2305 02/02/18 0332 02/02/18 0750  GLUCAP 157* 148* 143* 157* 143*    This patient is critically ill with multiple organ system failure; which, requires frequent high complexity decision making, assessment, support, evaluation, and titration of therapies. This was completed through  the application of advanced monitoring technologies and extensive interpretation of multiple databases. During this encounter critical care time was devoted to patient care services described in this note for 39 minutes.   Josephine Igo, DO [facility] 02/02/2018 8:16 AM  Personal pager: [phone] If unanswered, please page CCM On-call: #[phone]

## 2018-02-02 NOTE — Progress Notes (Signed)
Patient placed back on rest mode for the night and will restart weaning trial in the AM

## 2018-02-02 NOTE — Progress Notes (Addendum)
STROKE TEAM PROGRESS NOTE   SUBJECTIVE (INTERVAL HISTORY) Her daughter is at the bedside.  Palliative care is also at bedside. Overall her condition is stable without significant change.  She is on weaning trials, still has global aphasia with right hemiplegia. Family declines trach and peg and extubation will be planned when family is ready.  OBJECTIVE Temp:  [97.6 F (36.4 C)-99.6 F (37.6 C)] 98.3 F (36.8 C) (02/01 0349) Pulse Rate:  [76-138] 82 (02/01 0700) Cardiac Rhythm: Atrial fibrillation (02/01 0400) Resp:  [7-31] 22 (02/01 0700) BP: (94-150)/(61-137) 109/68 (02/01 0700) SpO2:  [96 %-100 %] 98 % (02/01 0700) FiO2 (%):  [30 %] 30 % (02/01 0400) Weight:  [73.1 kg] 73.1 kg (02/01 0500)  Recent Labs  Lab 02/01/18 1542 02/01/18 1935 02/01/18 2305 02/02/18 0332 02/02/18 0750  GLUCAP 157* 148* 143* 157* 143*   Recent Labs  Lab 01/28/18 [id number] 01/28/18 1449 01/29/18 0454 01/29/18 1636 01/30/18 0543 01/31/18 0440 02/01/18 0519  NA 136  --   --  137 141 143 146*  K 3.7  --   --  2.8* 3.8 3.3* 4.3  CL 97*  --   --  102 107 108 110  CO2 23  --   --  22 24 26 27   GLUCOSE 87  --   --  128* 149* 161* 154*  BUN 23  --   --  36* 40* 36* 37*  CREATININE 1.23*  --   --  1.14* 1.17* 1.01* 0.92  CALCIUM 9.1  --   --  8.2* 8.2* 8.0* 8.4*  MG 2.0 2.0 2.1 2.1  --  2.2  --   PHOS 3.5 4.0 4.3 3.4  --  3.0  --    Recent Labs  Lab 01/27/18 1553 01/28/18 0639  AST 66* 74*  ALT 38 37  ALKPHOS 66 63  BILITOT 1.1 1.7*  PROT 7.6 7.6  ALBUMIN 3.6 3.4*   Recent Labs  Lab 01/27/18 1553  01/29/18 1636 01/30/18 0543 01/31/18 0440 02/01/18 0519 02/02/18 0609  WBC 13.2*   < > 15.1* 14.7* 14.3* 13.8* 16.1*  NEUTROABS 10.9*  --   --   --   --   --   --   HGB 13.4   < > 13.0 11.8* 11.6* 11.1* 11.7*  HCT 43.7   < > 39.4 36.8 36.3 36.1 38.3  MCV 92.4   < > 87.6 88.9 90.3 92.3 92.3  PLT 244   < > 244 211 250 260 308   < > = values in this interval not displayed.   No results for  input(s): CKTOTAL, CKMB, CKMBINDEX, TROPONINI in the last 168 hours. No results for input(s): LABPROT, INR in the last 72 hours. No results for input(s): COLORURINE, LABSPEC, PHURINE, GLUCOSEU, HGBUR, BILIRUBINUR, KETONESUR, PROTEINUR, UROBILINOGEN, NITRITE, LEUKOCYTESUR in the last 72 hours.  Invalid input(s): APPERANCEUR     Component Value Date/Time   CHOL 168 01/28/2018 0639   TRIG 75 01/28/2018 0639   HDL 42 01/28/2018 0639   CHOLHDL 4.0 01/28/2018 0639   VLDL 15 01/28/2018 0639   LDLCALC 111 (H) 01/28/2018 0639   Lab Results  Component Value Date   HGBA1C 5.5 01/28/2018   No results found for: LABOPIA, COCAINSCRNUR, LABBENZ, AMPHETMU, THCU, LABBARB  No results for input(s): ETH in the last 168 hours.  I have personally reviewed the radiological images below and agree with the radiology interpretations.  Ct Angio Head W Or Wo Contrast  Result Date: 01/27/2018 CLINICAL  DATA:  Code stroke. Found on the ground. Right-sided weakness. EXAM: CT ANGIOGRAPHY HEAD AND NECK TECHNIQUE: Multidetector CT imaging of the head and neck was performed using the standard protocol during bolus administration of intravenous contrast. Multiplanar CT image reconstructions and MIPs were obtained to evaluate the vascular anatomy. Carotid stenosis measurements (when applicable) are obtained utilizing NASCET criteria, using the distal internal carotid diameter as the denominator. CONTRAST:  74mL ISOVUE-370 IOPAMIDOL (ISOVUE-370) INJECTION 76% COMPARISON:  CT earlier same day FINDINGS: CTA NECK FINDINGS Aortic arch: Aortic atherosclerosis with considerable luminal irregularity. Branching pattern of great toe cephalic vessels from the arch is normal. No origin stenosis greater than 30%. Right carotid system: Right common carotid artery is tortuous but widely patent to the bifurcation. Carotid bifurcation is free of atherosclerotic plaque or narrowing. Right cervical ICA is widely patent though tortuous. Left  carotid system: Common carotid artery is tortuous but widely patent to the bifurcation. Carotid bifurcation shows minimal calcified plaque at the proximal ICA but there is no stenosis. Cervical ICA shows focal narrowing with wall thickening and irregularity in its midportion with diameter of 2.6 mm. This is a stenosis of about 50%. The wall irregularity presumably represents plaque/thrombus and could be a source of embolus. Vertebral arteries: There is atherosclerotic plaque at both vertebral artery origins but no stenosis greater than 30%. Beyond that, both vertebral arteries are widely patent through the cervical region to the foramen magnum. Skeleton: Ordinary cervical spondylosis. Upper thoracic congenital failure of separation. Other neck: No mass or lymphadenopathy. Upper chest: Right pleural effusion. Pulmonary scarring. Question interstitial edema. Review of the MIP images confirms the above findings CTA HEAD FINDINGS Anterior circulation: Both internal carotid arteries are patent through the siphon region. On the right, there is atherosclerotic calcification but no stenosis. The anterior and middle cerebral vessels are patent. On the left, there is complete occlusion of the left M1 segment due to an embolus. Flow is present within the left anterior cerebral artery. There is poor collateral demonstration, though the contrast phase is quite early. Posterior circulation: Both vertebral arteries are patent to the basilar. Early phase contrast enhancement makes it difficult to accurately evaluate the distal vertebral arteries but I think both are sufficiently patent to the basilar. No basilar stenosis is seen. Superior cerebellar and posterior cerebral vessels are patent, though there distal branches are not yet opacified. Venous sinuses: Too early to assess venous sinuses. Anatomic variants: None other significant. Delayed phase: Not performed. Review of the MIP images confirms the above findings IMPRESSION: 1.  Complete occlusion of the left M1 segment due to an embolus. Flow is present within the left anterior cerebral artery. 2. Atherosclerotic disease at both carotid bifurcations but no stenosis. 3. Focal narrowing and irregularity of the mid cervical ICA on the left with diameter of 2.6 mm. This probably represents ulcerated soft plaque and this could be a source of embolus. 4. These results were communicated to Dr. Otelia Limes at 4:50 pmon 1/26/2020by text page via the [facility] messaging system. Electronically Signed   By: Paulina Fusi M.D.   On: 01/27/2018 16:54   Ct Angio Neck W Or Wo Contrast  Result Date: 01/27/2018 CLINICAL DATA:  Code stroke. Found on the ground. Right-sided weakness. EXAM: CT ANGIOGRAPHY HEAD AND NECK TECHNIQUE: Multidetector CT imaging of the head and neck was performed using the standard protocol during bolus administration of intravenous contrast. Multiplanar CT image reconstructions and MIPs were obtained to evaluate the vascular anatomy. Carotid stenosis measurements (when applicable) are  obtained utilizing NASCET criteria, using the distal internal carotid diameter as the denominator. CONTRAST:  50mL ISOVUE-370 IOPAMIDOL (ISOVUE-370) INJECTION 76% COMPARISON:  CT earlier same day FINDINGS: CTA NECK FINDINGS Aortic arch: Aortic atherosclerosis with considerable luminal irregularity. Branching pattern of great toe cephalic vessels from the arch is normal. No origin stenosis greater than 30%. Right carotid system: Right common carotid artery is tortuous but widely patent to the bifurcation. Carotid bifurcation is free of atherosclerotic plaque or narrowing. Right cervical ICA is widely patent though tortuous. Left carotid system: Common carotid artery is tortuous but widely patent to the bifurcation. Carotid bifurcation shows minimal calcified plaque at the proximal ICA but there is no stenosis. Cervical ICA shows focal narrowing with wall thickening and irregularity in its midportion with  diameter of 2.6 mm. This is a stenosis of about 50%. The wall irregularity presumably represents plaque/thrombus and could be a source of embolus. Vertebral arteries: There is atherosclerotic plaque at both vertebral artery origins but no stenosis greater than 30%. Beyond that, both vertebral arteries are widely patent through the cervical region to the foramen magnum. Skeleton: Ordinary cervical spondylosis. Upper thoracic congenital failure of separation. Other neck: No mass or lymphadenopathy. Upper chest: Right pleural effusion. Pulmonary scarring. Question interstitial edema. Review of the MIP images confirms the above findings CTA HEAD FINDINGS Anterior circulation: Both internal carotid arteries are patent through the siphon region. On the right, there is atherosclerotic calcification but no stenosis. The anterior and middle cerebral vessels are patent. On the left, there is complete occlusion of the left M1 segment due to an embolus. Flow is present within the left anterior cerebral artery. There is poor collateral demonstration, though the contrast phase is quite early. Posterior circulation: Both vertebral arteries are patent to the basilar. Early phase contrast enhancement makes it difficult to accurately evaluate the distal vertebral arteries but I think both are sufficiently patent to the basilar. No basilar stenosis is seen. Superior cerebellar and posterior cerebral vessels are patent, though there distal branches are not yet opacified. Venous sinuses: Too early to assess venous sinuses. Anatomic variants: None other significant. Delayed phase: Not performed. Review of the MIP images confirms the above findings IMPRESSION: 1. Complete occlusion of the left M1 segment due to an embolus. Flow is present within the left anterior cerebral artery. 2. Atherosclerotic disease at both carotid bifurcations but no stenosis. 3. Focal narrowing and irregularity of the mid cervical ICA on the left with diameter of  2.6 mm. This probably represents ulcerated soft plaque and this could be a source of embolus. 4. These results were communicated to Dr. Otelia Limes at 4:50 pmon 1/26/2020by text page via the [facility] messaging system. Electronically Signed   By: Paulina Fusi M.D.   On: 01/27/2018 16:54   Ct Cervical Spine Wo Contrast  Result Date: 01/27/2018 CLINICAL DATA:  Found down. Stroke. Clinical concern for cervical spine fracture. EXAM: CT CERVICAL SPINE WITHOUT CONTRAST TECHNIQUE: Multidetector CT imaging of the cervical spine was performed without intravenous contrast. Multiplanar CT image reconstructions were also generated. COMPARISON:  Head CT obtained at the same time. FINDINGS: Alignment: Minimal reversal of the normal lordosis. No subluxations. Skull base and vertebrae: No acute fracture. No primary bone lesion or focal pathologic process. Soft tissues and spinal canal: No prevertebral fluid or swelling. No visible canal hematoma. Disc levels:  Multilevel degenerative changes. Upper chest: Minimal biapical bullous changes. Small to moderate-sized right pleural effusion. Other: Endotracheal and orogastric tubes are in place. Small right lobe  thyroid nodule measuring 6 mm in maximum diameter. Minimal carotid artery calcification on the left. IMPRESSION: 1. No fracture or subluxation. 2. Minimal reversal of the normal lordosis. 3. Multilevel degenerative changes. 4. Small to moderate-sized right pleural effusion. 5. Minimal left carotid artery atheromatous calcification. 6. 6 mm right lobe thyroid nodule. This is too small to characterize, but most likely benign in the absence of known clinical risk factors for thyroid carcinoma. Electronically Signed   By: Beckie Salts M.D.   On: 01/27/2018 16:46   Mr Brain Wo Contrast  Result Date: 01/28/2018 CLINICAL DATA:  Found unconscious at home.  MCA occlusion. EXAM: MRI HEAD WITHOUT CONTRAST TECHNIQUE: Multiplanar, multiecho pulse sequences of the brain and surrounding  structures were obtained without intravenous contrast. COMPARISON:  CTA head neck 01/27/2018 FINDINGS: BRAIN: Large area of ischemic infarct within the left MCA territory, involving most of the frontal operculum and insula, as well as parts of the left basal ganglia and left temporal lobe. The midline structures are normal. There is moderate edema throughout the ischemic territory. Early confluent hyperintense T2-weighted signal of the periventricular and deep white matter, most commonly due to chronic ischemic microangiopathy. There is petechial hemorrhage throughout the infarcted area. There is laminar necrosis at a site of remote infarct in the left occipital lobe. VASCULAR: Major intracranial arterial and venous sinus flow voids are normal. SKULL AND UPPER CERVICAL SPINE: Right frontal scalp hematoma. SINUSES/ORBITS: No fluid levels or advanced mucosal thickening. No mastoid or middle ear effusion. The orbits are normal. IMPRESSION: 1. Large ischemic infarct within the left MCA territory with confluent petechial hemorrhage throughout the affected area Heidelberg classification 1b: HI2, confluent petechiae, no mass effect. 2. No midline shift or hydrocephalus. Electronically Signed   By: Deatra Robinson M.D.   On: 01/28/2018 04:50   Ct Head Code Stroke Wo Contrast  Result Date: 01/27/2018 CLINICAL DATA:  Code stroke. Last seen normal 2100 hours yesterday. Flaccid on the right side. EXAM: CT HEAD WITHOUT CONTRAST TECHNIQUE: Contiguous axial images were obtained from the base of the skull through the vertex without intravenous contrast. COMPARISON:  None. FINDINGS: Brain: Brainstem and cerebellum are unremarkable except for chronic small-vessel ischemic changes of the pons. Old infarction in the left occipital lobe. Acute infarction in the left hemisphere in the MCA territory affecting the insula, deep white matter and cortical brain in 3 middle cerebral artery sectors. No evidence of hemorrhage. No mass effect  or shift. Chronic small-vessel ischemic changes affect the white matter elsewhere. Vascular: Hyperdense left MCA in the distal M1/bifurcation region. Skull: Negative Sinuses/Orbits: Clear/normal Other: None ASPECTS (Alberta Stroke Program Early CT Score) - Ganglionic level infarction (caudate, lentiform nuclei, internal capsule, insula, M1-M3 cortex): 4 - Supraganglionic infarction (M4-M6 cortex): 2 Total score (0-10 with 10 being normal): 6 IMPRESSION: 1. Acute infarction in the left MCA territory affecting the insula, deep white matter and cortical brain in 3 middle cerebral artery sectors. No hemorrhage or mass effect. 2. ASPECTS is 6. 3. Hyperdense left MCA. *This report was discussed by telephone with Dr. Otelia Limes at 1621 hours. Electronically Signed   By: Paulina Fusi M.D.   On: 01/27/2018 16:31    PHYSICAL EXAM  Temp:  [97.6 F (36.4 C)-99.6 F (37.6 C)] 98.3 F (36.8 C) (02/01 0349) Pulse Rate:  [76-138] 82 (02/01 0700) Resp:  [7-31] 22 (02/01 0700) BP: (94-150)/(61-137) 109/68 (02/01 0700) SpO2:  [96 %-100 %] 98 % (02/01 0700) FiO2 (%):  [30 %] 30 % (02/01 0400) Weight:  [  73.1 kg] 73.1 kg (02/01 0500)  General - Well nourished, well developed, intubated off sedation  Ophthalmologic - fundi not visualized due to noncooperation.  Cardiovascular - irregularly irregular heart rate and rhythm.  Neuro - intubated off sedation, eyes open, not following commands. Eyes in left gaze position, right neglect, blinks to threat on the left.  doll's eyes present, not tracking, PERRL. Corneal reflex present bilaterally, gag and cough present. Breathing over the vent.  Facial symmetry not able to test due to ET tube.  Tongue midline in mouth. On pain stimulation, slight withdraw of RUE and RLE, left arm can hold against gravity, LLE mild withdraw but able to wiggle toes spontaneously. DTR 1+ and positive babinski on the right. Sensation, coordination and gait not tested.   ASSESSMENT/PLAN Ms.  Jodene Mandez is a 85 y.o. female spanish speaking only with no documented history but seems to have afib and CHF and HTN admitted for found down at home, unresponsive, with right side flaccid. Intubated in ED. No tPA given due to outside window and established infarct.   Stroke:  left large MCA infarct embolic pattern secondary to afib not on AC  CT head - old left PCA infarct, acute left MCA large infarct  MRI  Left large MCA infarct with hemorrhagic conversion, old left PCA infarct  CTA head and neck - left M1 occlusion  2D Echo EF 55 to 60%  CT repeat 01/29/18 increased mass effect with MLS 84mm and hemorrhagic conversion unchanged  LDL 111  HgbA1c 5.5  Heparin subq for VTE prophylaxis  NPO  aspirin 81 mg daily prior to admission, now on ASA 81. No anticoagulation for now given significant hemorrhagic conversion.  Ongoing aggressive stroke risk factor management  Therapy recommendations:  Pending   Disposition:  Pending, discussed with daughter today and she stated that family would like continue intubation for now, and consider next week one-way extubation without re-intubation.  Afib  Home meds including cardizem  EKG on admission confirmed afib  On ASA at home  Likely the cause of current stroke  Rate under control  On ASA 81mg   No anticoagulation for now due to significant hemorrhagic conversion  CHF  TTE EF 55 to 60%  Has metolazone PTA  Daughter confirmed fluid overload in the past  CCM on board  Gentle hydration, on TF  Respiratory failure  Intubated off sedation  CCM on board  on weaning trials, tolerating  Family would like one way extubation next week  Hypertension . Stable  BP goal < 160 due to hemorrhagic conversion  Off Neo-Synephrine  Hyperlipidemia  Home meds:  none   LDL 111, goal < 70  Now on lipitor 20  Continue statin at discharge  Other Stroke Risk Factors  Advanced age  Hx of stroke on image - left PCA old  infarct on CT and MRI  Other Active Problems  CKD stage II - Cre 1.23->1.17 - gentle hydration with TF ->1.01->0.92  Leukocytosis - WBC 14.2->14.7->14.3 ->13.8->16.1 (afebrile)  Hospital day # 6   Stroke will sign off at this time. Please do not hesitate to re-consult if necessary. If patient is discharged from floor will go to the Hospitalist service. Please call if we can be of further service.   This patient is critically ill due to left MCA large infarct, afib not on AC, hemorrhagic conversion, CHF and at significant risk of neurological worsening, death form recurrent stroke, cerebral edema, brain herniation, heart failure, seizure.   This patient  is critically ill and at significant risk of neurological worsening, death and care requires constant monitoring of vital signs, hemodynamics,respiratory and cardiac monitoring,review of multiple databases, neurological assessment, discussion with family, other specialists and medical decision making of high complexity.I  I spent 30 minutes of neurocritical care time in the care of this patient.  Naomie Dean, MD [facility]    To contact Stroke Continuity provider, please refer to [email or web address]. After hours, contact General Neurology

## 2018-02-03 DIAGNOSIS — Z66 Do not resuscitate: Secondary | ICD-10-CM

## 2018-02-03 LAB — CBC WITH DIFFERENTIAL/PLATELET
Abs Immature Granulocytes: 0.11 10*3/uL — ABNORMAL HIGH (ref 0.00–0.07)
Basophils Absolute: 0 10*3/uL (ref 0.0–0.1)
Basophils Relative: 0 %
Eosinophils Absolute: 0.1 10*3/uL (ref 0.0–0.5)
Eosinophils Relative: 1 %
HCT: 38.7 % (ref 36.0–46.0)
HEMOGLOBIN: 12.2 g/dL (ref 12.0–15.0)
Immature Granulocytes: 1 %
Lymphocytes Relative: 8 %
Lymphs Abs: 1.1 10*3/uL (ref 0.7–4.0)
MCH: 29 pg (ref 26.0–34.0)
MCHC: 31.5 g/dL (ref 30.0–36.0)
MCV: 91.9 fL (ref 80.0–100.0)
MONO ABS: 1.1 10*3/uL — AB (ref 0.1–1.0)
Monocytes Relative: 8 %
Neutro Abs: 11.8 10*3/uL — ABNORMAL HIGH (ref 1.7–7.7)
Neutrophils Relative %: 82 %
Platelets: 363 10*3/uL (ref 150–400)
RBC: 4.21 MIL/uL (ref 3.87–5.11)
RDW: 16.1 % — ABNORMAL HIGH (ref 11.5–15.5)
WBC: 14.2 10*3/uL — ABNORMAL HIGH (ref 4.0–10.5)
nRBC: 0 % (ref 0.0–0.2)

## 2018-02-03 LAB — GLUCOSE, CAPILLARY
Glucose-Capillary: 144 mg/dL — ABNORMAL HIGH (ref 70–99)
Glucose-Capillary: 147 mg/dL — ABNORMAL HIGH (ref 70–99)
Glucose-Capillary: 154 mg/dL — ABNORMAL HIGH (ref 70–99)
Glucose-Capillary: 142 mg/dL — ABNORMAL HIGH (ref 70–99)
Glucose-Capillary: 146 mg/dL — ABNORMAL HIGH (ref 70–99)
Glucose-Capillary: 148 mg/dL — ABNORMAL HIGH (ref 70–99)

## 2018-02-03 LAB — BASIC METABOLIC PANEL
ANION GAP: 13 (ref 5–15)
BUN: 50 mg/dL — ABNORMAL HIGH (ref 8–23)
CO2: 29 mmol/L (ref 22–32)
Calcium: 8.9 mg/dL (ref 8.9–10.3)
Chloride: 106 mmol/L (ref 98–111)
Creatinine, Ser: 1.06 mg/dL — ABNORMAL HIGH (ref 0.44–1.00)
GFR calc Af Amer: 55 mL/min — ABNORMAL LOW (ref >60)
GFR calc non Af Amer: 48 mL/min — ABNORMAL LOW (ref >60)
GLUCOSE: 187 mg/dL — AB (ref 70–99)
Potassium: 3.2 mmol/L — ABNORMAL LOW (ref 3.5–5.1)
Sodium: 148 mmol/L — ABNORMAL HIGH (ref 135–145)

## 2018-02-03 NOTE — Progress Notes (Signed)
PCCM:  Palliative care discussion at bedside with daughter.  At this point she understands that CPR in the setting of cardiopulmonary arrest with her current medical comorbidities would likely not prove beneficial.  At this time patient's CODE STATUS was updated per request of the patient's daughter to be a DNR.  She is in agreement with this plan as she understands CPR will likely not improve outcome.  I have asked the patient's bedside nurse to witness this with documentation in the medical record as well.  Josephine Igo, DO [facility] 02/03/2018 10:39 AM

## 2018-02-03 NOTE — Progress Notes (Signed)
.   NAMCaprice Vazquez:  Kelli Vazquez, MRN:  [id number], DOB:  1929-08-08, LOS: 7 ADMISSION DATE:  01/27/2018, CONSULTATION DATE:  1/26  REFERRING MD:  Mesner, CHIEF COMPLAINT:  Acute encephalopathy   Brief History   85 y/o female admitted on 1/26 with embolic left MCA stroke.  Past Medical History  HTN, CHF  Significant Hospital Events   Admission/intubation 1/26  Consults:  PCCM Neurology  Procedures:  1/26 intubated>>  Significant Diagnostic Tests:  CTA 1/26: 1. Complete occlusion of the left M1 segment due to an embolus. Focal narrowing and irregularity of the mid cervical ICA on the left with diameter of 2.6 mm. This probably represents ulcerated soft plaque and this could be a source of embolus. MRI brain 1/27> large ischemic left MCA stroke, no midline shift ECHO 1/27> EF 55-65%, moderate LVH pattern CT head 1/28> evolving L MCA infarct with new 2mm midline shift CXR 1/31- bilateral infiltrates and bilateral pleural effusions CXR 2/1 - BL vascular congestion ETT 1 cm from carina   Micro Data:  1/26 MRSA> neg  Antimicrobials:  None  Interim history/subjective:  Discussed goals of care with the patient's daughter at bedside.  All questions answered to the best of my ability.  After the end of this discussion she understands that her mother has a very poor prognosis at recovery from her neurologic injury.  Would not want tracheostomy and PEG tube.  I encouraged the family to make a decision regarding prolonged mechanical ventilatory needs and if this was not the case then would recommend palliative withdrawal from the mechanical ventilator and comfort care measures.  We also had a CODE STATUS discussion.  I believe at this time they are ready to make the patient a durable DNR.  I will make these the changes in the medical record.  I have asked the nursing staff present to witness this decision.  Objective   Blood pressure 127/73, pulse 98, temperature 98.7 F (37.1 C), temperature source  Axillary, resp. rate 20, height 5\' 3"  (1.6 m), weight 67.8 kg, SpO2 96 %.    Vent Mode: PRVC FiO2 (%):  [30 %] 30 % Set Rate:  [20 bmp] 20 bmp Vt Set:  [320 mL] 320 mL PEEP:  [5 cmH20] 5 cmH20 Pressure Support:  [15 cmH20] 15 cmH20 Plateau Pressure:  [15 cmH20-18 cmH20] 17 cmH20   Intake/Output Summary (Last 24 hours) at 02/03/2018 1005 Last data filed at 02/03/2018 0800 Gross per 24 hour  Intake 1319.9 ml  Output 1165 ml  Net 154.9 ml   Filed Weights   02/01/18 0400 02/02/18 0500 02/03/18 0248  Weight: 72.9 kg 73.1 kg 67.8 kg    Examination:  General appearance:85 year old elderly female intubated on mechanical ventilation Eyes: Anicteric sclera, moist conjunctivo-, pupils reactive to light HENT: NCAT, mucous membranes moist Neck: Achy midline, no JVD, endotracheal tube in place Lungs: Bilateral mechanically ventilated breath sounds, no crackles, no wheeze CV: Irregular rate controlled, S1-S2, no MRG Abdomen: Soft, nontender, nondistended, bowel sounds present Extremities: Dependent edema within the bilateral upper and lower extremities and lateral flanks Skin: Normal temperature, no rash Neuro: Occasionally will be alert to voice per nursing staff.  Was not this way this morning for me.  Does have some spontaneous movements    Resolved Hospital Problem list    Hypotension, AKI   Assessment & Plan:   Acute respiratory failure with hypoxia requiring mechanical ventilation  -CXR 1/31 bilateral infiltrates and bilateral pleural effusions, likely related to CHF  P Patient  remains in the intensive care unit on mechanical ventilation and full support Unable to wean from mechanical ventilator at this time due to patient's poor mental status and ability to protect her airway. The only way that we would plan for liberation from the mechanical ventilator was that if she was to become a durable DNR with plans for no reintubation. We appreciate palliative care input on this  discussion. I will continue this discussion with family today.  Acute L MCA CVA with midline shift -old L PCA infarct -poor prognosis P We appreciate recommendations and input from neurology service Continue subcu heparin per neurology Low-dose aspirin as well as statin Goal BP less than 160.  Atrial fibrilation P Controlled at this time Not on any anticoagulation due to risk of hemorrhagic conversion  Acute on chronic diastolic heart failure EF 55-60% -CXR 1/31 signs of fluid overload P Continue low-dose Lasix at home dose metolazone  Hyperglycemia P CBGs every 4 Goal glucose 150-180  Leukocytosis, continuing to increase P Continue to observe off antibiotics Repeat CBC  Malnutrition, at risk P Tube feeding per protocol  Constipation P MiraLAX and Colace as needed  Goals of Care P We appreciate palliative care's input as they work through this very difficult situation.   Best practice:  Diet: Enteral Nutrition Pain/Anxiety/Delirium protocol (if indicated): PAD RASS goal 0 to -1 VAP protocol (if indicated): Yes DVT prophylaxis: SCD, sq Heparin GI prophylaxis: Pantoprazole  Glucose control: Monitor CBG Mobility: bed rest Code Status: full Family Communication: I discussed with the patient's daughter at bedside this morning. Disposition: Continue ICU care  Labs   CBC: Recent Labs  Lab 01/27/18 1553  01/29/18 1636 01/30/18 0543 01/31/18 0440 02/01/18 0519 02/02/18 0609  WBC 13.2*   < > 15.1* 14.7* 14.3* 13.8* 16.1*  NEUTROABS 10.9*  --   --   --   --   --   --   HGB 13.4   < > 13.0 11.8* 11.6* 11.1* 11.7*  HCT 43.7   < > 39.4 36.8 36.3 36.1 38.3  MCV 92.4   < > 87.6 88.9 90.3 92.3 92.3  PLT 244   < > 244 211 250 260 308   < > = values in this interval not displayed.    Basic Metabolic Panel: Recent Labs  Lab 01/28/18 0639 01/28/18 1449 01/29/18 0454 01/29/18 1636 01/30/18 0543 01/31/18 0440 02/01/18 0519 02/02/18 0609  NA 136  --    --  137 141 143 146* 147*  K 3.7  --   --  2.8* 3.8 3.3* 4.3 3.5  CL 97*  --   --  102 107 108 110 106  CO2 23  --   --  22 24 26 27 27   GLUCOSE 87  --   --  128* 149* 161* 154* 160*  BUN 23  --   --  36* 40* 36* 37* 44*  CREATININE 1.23*  --   --  1.14* 1.17* 1.01* 0.92 1.00  CALCIUM 9.1  --   --  8.2* 8.2* 8.0* 8.4* 8.8*  MG 2.0 2.0 2.1 2.1  --  2.2  --   --   PHOS 3.5 4.0 4.3 3.4  --  3.0  --   --    GFR: Estimated Creatinine Clearance: 37.4 mL/min (by C-G formula based on SCr of 1 mg/dL). Recent Labs  Lab 01/30/18 0543 01/31/18 0440 02/01/18 0519 02/02/18 0609  WBC 14.7* 14.3* 13.8* 16.1*    Liver Function Tests: Recent  Labs  Lab 01/27/18 1553 01/28/18 0639  AST 66* 74*  ALT 38 37  ALKPHOS 66 63  BILITOT 1.1 1.7*  PROT 7.6 7.6  ALBUMIN 3.6 3.4*   No results for input(s): LIPASE, AMYLASE in the last 168 hours. No results for input(s): AMMONIA in the last 168 hours.  ABG    Component Value Date/Time   PHART 7.583 (H) 01/28/2018 0615   PCO2ART 25.8 (L) 01/28/2018 0615   PO2ART 92.3 01/28/2018 0615   HCO3 24.4 01/28/2018 0615   O2SAT 97.6 01/28/2018 0615     Coagulation Profile: Recent Labs  Lab 01/27/18 1553  INR 1.12    Cardiac Enzymes: No results for input(s): CKTOTAL, CKMB, CKMBINDEX, TROPONINI in the last 168 hours.  HbA1C: Hgb A1c MFr Bld  Date/Time Value Ref Range Status  01/28/2018 06:39 AM 5.5 4.8 - 5.6 % Final    Comment:    (NOTE) Pre diabetes:          5.7%-6.4% Diabetes:              >6.4% Glycemic control for   <7.0% adults with diabetes     CBG: Recent Labs  Lab 02/02/18 1542 02/02/18 2057 02/02/18 2327 02/03/18 0406 02/03/18 0758  GLUCAP 152* 149* 147* 144* 147*   This patient is critically ill with multiple organ system failure; which, requires frequent high complexity decision making, assessment, support, evaluation, and titration of therapies. This was completed through the application of advanced monitoring  technologies and extensive interpretation of multiple databases.  Long discussion with family at bedside. During this encounter critical care time was devoted to patient care services described in this note for 33 minutes.   Josephine Igo, DO [facility] 02/03/2018 10:06 AM  Personal pager: [phone] If unanswered, please page CCM On-call: #[phone]

## 2018-02-04 LAB — CBC
HCT: 39.7 % (ref 36.0–46.0)
Hemoglobin: 12.4 g/dL (ref 12.0–15.0)
MCH: 29 pg (ref 26.0–34.0)
MCHC: 31.2 g/dL (ref 30.0–36.0)
MCV: 92.8 fL (ref 80.0–100.0)
Platelets: 381 10*3/uL (ref 150–400)
RBC: 4.28 MIL/uL (ref 3.87–5.11)
RDW: 16.1 % — AB (ref 11.5–15.5)
WBC: 14.3 10*3/uL — AB (ref 4.0–10.5)
nRBC: 0 % (ref 0.0–0.2)

## 2018-02-04 LAB — GLUCOSE, CAPILLARY
GLUCOSE-CAPILLARY: 144 mg/dL — AB (ref 70–99)
Glucose-Capillary: 141 mg/dL — ABNORMAL HIGH (ref 70–99)
Glucose-Capillary: 179 mg/dL — ABNORMAL HIGH (ref 70–99)
Glucose-Capillary: 178 mg/dL — ABNORMAL HIGH (ref 70–99)
Glucose-Capillary: 148 mg/dL — ABNORMAL HIGH (ref 70–99)
Glucose-Capillary: 170 mg/dL — ABNORMAL HIGH (ref 70–99)

## 2018-02-04 LAB — BASIC METABOLIC PANEL
Anion gap: 13 (ref 5–15)
BUN: 60 mg/dL — ABNORMAL HIGH (ref 8–23)
CO2: 32 mmol/L (ref 22–32)
Calcium: 8.8 mg/dL — ABNORMAL LOW (ref 8.9–10.3)
Chloride: 106 mmol/L (ref 98–111)
Creatinine, Ser: 1.13 mg/dL — ABNORMAL HIGH (ref 0.44–1.00)
GFR calc Af Amer: 51 mL/min — ABNORMAL LOW (ref >60)
GFR calc non Af Amer: 44 mL/min — ABNORMAL LOW (ref >60)
Glucose, Bld: 180 mg/dL — ABNORMAL HIGH (ref 70–99)
Potassium: 3 mmol/L — ABNORMAL LOW (ref 3.5–5.1)
Sodium: 151 mmol/L — ABNORMAL HIGH (ref 135–145)

## 2018-02-04 MED ORDER — FREE WATER
200.0000 mL | Freq: Three times a day (TID) | Status: DC
  Administered 2018-02-04 – 2018-02-05 (×3): 200 mL

## 2018-02-04 NOTE — Progress Notes (Signed)
Nutrition Follow-up  DOCUMENTATION CODES:   Not applicable  INTERVENTION:   Vital High Protein @ 50 ml/hr (1200 ml/day)   Provides: 1200 kcal, 105 grams protein, and 1003 ml free water.    NUTRITION DIAGNOSIS:   Inadequate oral intake related to inability to eat as evidenced by NPO status. Ongoing  GOAL:   Patient will meet greater than or equal to 90% of their needs Met.   MONITOR:   Vent status, TF tolerance  ASSESSMENT:   Pt is spanish speaking only with PMH of suspected HTN, CHF, afib admitted with left large MCA stroke with hemorrhagic conversion.    Pt discussed during ICU rounds and with RN.  Per MD pt now DNR, possible one-way extubation/comfort   Patient is currently intubated on ventilator support MV: 6.1 L/min Temp (24hrs), Avg:99.2 F (37.3 C), Min:98.7 F (37.1 C), Max:99.9 F (37.7 C)  Medications reviewed  200 ml free water every 8 hours = 600 ml Labs reviewed: Na 151 (H), K+ 3 (L)   TF: Vital High Protein @ 40 ml/hr and 30 ml Prostat BID Provides: 1160 kcal, 114 grams protein   NUTRITION - FOCUSED PHYSICAL EXAM:    Most Recent Value  Orbital Region  No depletion  Upper Arm Region  No depletion  Thoracic and Lumbar Region  No depletion  Buccal Region  Unable to assess  Temple Region  Mild depletion  Clavicle Bone Region  No depletion  Clavicle and Acromion Bone Region  Mild depletion  Scapular Bone Region  Unable to assess  Dorsal Hand  Unable to assess [edema]  Patellar Region  No depletion  Anterior Thigh Region  No depletion  Posterior Calf Region  No depletion  Edema (RD Assessment)  Moderate [BUE]  Hair  Reviewed  Eyes  Unable to assess  Mouth  Unable to assess  Skin  Reviewed  Nails  Reviewed       Diet Order:   Diet Order            Diet NPO time specified  Diet effective now              EDUCATION NEEDS:   No education needs have been identified at this time  Skin:  Skin Assessment: Reviewed RN  Assessment  Last BM:  2/3 rectal pouch added  Height:   Ht Readings from Last 1 Encounters:  02/04/18 _0  (1.6 m)    Weight:   Wt Readings from Last 1 Encounters:  02/04/18 67.7 kg    Ideal Body Weight:  52.2 kg  BMI:  Body mass index is 26.44 kg/m.  Estimated Nutritional Needs:   Kcal:  1275  Protein:  85-95 grams  Fluid:  > 1.5 L/day  Maylon Peppers RD, LDN, CNSC [phone] Pager [phone] After Hours Pager

## 2018-02-04 NOTE — Progress Notes (Signed)
.   NAMEFynley Vazquez, MRN:  [id number], DOB:  1929/11/19, LOS: 8 ADMISSION DATE:  01/27/2018, CONSULTATION DATE:  1/26  REFERRING MD:  Mesner, CHIEF COMPLAINT:  Acute encephalopathy   Brief History   85 y/o female admitted on 1/26 with embolic left MCA stroke.  Past Medical History  HTN, CHF  Significant Hospital Events   Admission/intubation 1/26  Consults:  PCCM Neurology  Procedures:  1/26 intubated>>  Significant Diagnostic Tests:  CTA 1/26: 1. Complete occlusion of the left M1 segment due to an embolus. Focal narrowing and irregularity of the mid cervical ICA on the left with diameter of 2.6 mm. This probably represents ulcerated soft plaque and this could be a source of embolus. MRI brain 1/27> large ischemic left MCA stroke, no midline shift ECHO 1/27> EF 55-65%, moderate LVH pattern CT head 1/28> evolving L MCA infarct with new 55mm midline shift CXR 1/31- bilateral infiltrates and bilateral pleural effusions CXR 2/1 - BL vascular congestion ETT 1 cm from carina   Micro Data:  1/26 MRSA> neg  Antimicrobials:  None  Interim history/subjective:  No clinical change Has not received sedation for > 2 days.   Objective   Blood pressure (!) 151/75, pulse (!) 101, temperature 99.9 F (37.7 C), temperature source Axillary, resp. rate (!) 21, height 5\' 3"  (1.6 m), weight 67.7 kg, SpO2 98 %.    Vent Mode: PSV;CPAP FiO2 (%):  [30 %] 30 % Set Rate:  [20 bmp] 20 bmp Vt Set:  [320 mL] 320 mL PEEP:  [5 cmH20] 5 cmH20 Pressure Support:  [5 cmH20] 5 cmH20 Plateau Pressure:  [14 cmH20-19 cmH20] 14 cmH20   Intake/Output Summary (Last 24 hours) at 02/04/2018 1227 Last data filed at 02/04/2018 0700 Gross per 24 hour  Intake 1139.69 ml  Output 400 ml  Net 739.69 ml   Filed Weights   02/02/18 0500 02/03/18 0248 02/04/18 0500  Weight: 73.1 kg 67.8 kg 67.7 kg    Examination:  General appearance: Ill-appearing elderly woman, mechanically ventilated Eyes: Opens eyes  spontaneously, pupils equal and do react HENT: ET tube in place Lungs: Clear bilaterally, decreased at the bases, no wheezing, no crackles CV: I irregularly irregular, no murmur Abdomen soft, nondistended, positive bowel sounds Extremities: 1+ lower extremity edema Skin: No rash Neuro: Opens her eyes with vigorous stimulation, may be tracking, moves her left upper extremity spontaneously but will not follow commands.  No movement on the right    Resolved Hospital Problem list    Hypotension, AKI   Assessment & Plan:   Acute respiratory failure with hypoxia requiring mechanical ventilation  -CXR 1/31 bilateral infiltrates and bilateral pleural effusions, likely related to CHF  P Continue current ventilator management.  She does not have the mental status to protect her airway or to consider extubation. Duration of mechanical ventilator support unclear.  Family contemplating goals.  They would not want prolonged support, trach, PEG.  Appreciate palliative care service assistance with this.  Acute L MCA CVA with midline shift -old L PCA infarct -poor prognosis P Aspirin, subcutaneous heparin, statin Goal SBP<160  Atrial fibrilation P Anticoagulation on hold given concern for possible hemorrhagic conversion Good rate control currently  Acute on chronic diastolic heart failure EF 55-60% -CXR 1/31 signs of fluid overload P Continue same low-dose Lasix, home dose metolazone  Hyperglycemia P Continue to follow CBG, goal <180  Leukocytosis, continuing to increase P Follow CBC  Malnutrition, at risk P Tube feeding  Constipation P Bowel regimen  ordered  Goals of Care P DNR established. Also seem to have established that pt would not want trach / PEG, prolonged care. Unclear how long we will support before considering either one-way extubation or transition to comfort.    Best practice:  Diet: Enteral Nutrition Pain/Anxiety/Delirium protocol (if indicated): PAD RASS  goal 0 to -1 VAP protocol (if indicated): Yes DVT prophylaxis: SCD, sq Heparin GI prophylaxis: Pantoprazole  Glucose control: Monitor CBG Mobility: bed rest Code Status: full Family Communication: discussed with daughter at bedside 2/3 Disposition: Continue ICU care  Labs   CBC: Recent Labs  Lab 01/31/18 0440 02/01/18 0519 02/02/18 0609 02/03/18 1017 02/04/18 0143  WBC 14.3* 13.8* 16.1* 14.2* 14.3*  NEUTROABS  --   --   --  11.8*  --   HGB 11.6* 11.1* 11.7* 12.2 12.4  HCT 36.3 36.1 38.3 38.7 39.7  MCV 90.3 92.3 92.3 91.9 92.8  PLT 250 260 308 363 381    Basic Metabolic Panel: Recent Labs  Lab 01/28/18 1449 01/29/18 0454 01/29/18 1636  01/31/18 0440 02/01/18 0519 02/02/18 0609 02/03/18 1017 02/04/18 0143  NA  --   --  137   < > 143 146* 147* 148* 151*  K  --   --  2.8*   < > 3.3* 4.3 3.5 3.2* 3.0*  CL  --   --  102   < > 108 110 106 106 106  CO2  --   --  22   < > 26 27 27 29  32  GLUCOSE  --   --  128*   < > 161* 154* 160* 187* 180*  BUN  --   --  36*   < > 36* 37* 44* 50* 60*  CREATININE  --   --  1.14*   < > 1.01* 0.92 1.00 1.06* 1.13*  CALCIUM  --   --  8.2*   < > 8.0* 8.4* 8.8* 8.9 8.8*  MG 2.0 2.1 2.1  --  2.2  --   --   --   --   PHOS 4.0 4.3 3.4  --  3.0  --   --   --   --    < > = values in this interval not displayed.   GFR: Estimated Creatinine Clearance: 33 mL/min (A) (by C-G formula based on SCr of 1.13 mg/dL (H)). Recent Labs  Lab 02/01/18 0519 02/02/18 0609 02/03/18 1017 02/04/18 0143  WBC 13.8* 16.1* 14.2* 14.3*    Liver Function Tests: No results for input(s): AST, ALT, ALKPHOS, BILITOT, PROT, ALBUMIN in the last 168 hours. No results for input(s): LIPASE, AMYLASE in the last 168 hours. No results for input(s): AMMONIA in the last 168 hours.  ABG    Component Value Date/Time   PHART 7.583 (H) 01/28/2018 0615   PCO2ART 25.8 (L) 01/28/2018 0615   PO2ART 92.3 01/28/2018 0615   HCO3 24.4 01/28/2018 0615   O2SAT 97.6 01/28/2018 0615       Coagulation Profile: No results for input(s): INR, PROTIME in the last 168 hours.  Cardiac Enzymes: No results for input(s): CKTOTAL, CKMB, CKMBINDEX, TROPONINI in the last 168 hours.  HbA1C: Hgb A1c MFr Bld  Date/Time Value Ref Range Status  01/28/2018 06:39 AM 5.5 4.8 - 5.6 % Final    Comment:    (NOTE) Pre diabetes:          5.7%-6.4% Diabetes:              >6.4% Glycemic control  for   <7.0% adults with diabetes     CBG: Recent Labs  Lab 02/03/18 1922 02/03/18 2309 02/04/18 0311 02/04/18 0749 02/04/18 1200  GLUCAP 146* 148* 141* 179* 144*    Independent CC time 33 minutes.    Levy Pupa, MD, PhD 02/04/2018, 12:42 PM [facility] [phone] or if no answer [phone]

## 2018-02-05 LAB — CBC
HCT: 39.9 % (ref 36.0–46.0)
Hemoglobin: 12 g/dL (ref 12.0–15.0)
MCH: 27.9 pg (ref 26.0–34.0)
MCHC: 30.1 g/dL (ref 30.0–36.0)
MCV: 92.8 fL (ref 80.0–100.0)
Platelets: 383 10*3/uL (ref 150–400)
RBC: 4.3 MIL/uL (ref 3.87–5.11)
RDW: 16.1 % — ABNORMAL HIGH (ref 11.5–15.5)
WBC: 12.1 10*3/uL — ABNORMAL HIGH (ref 4.0–10.5)
nRBC: 0 % (ref 0.0–0.2)

## 2018-02-05 LAB — GLUCOSE, CAPILLARY
GLUCOSE-CAPILLARY: 165 mg/dL — AB (ref 70–99)
Glucose-Capillary: 159 mg/dL — ABNORMAL HIGH (ref 70–99)
Glucose-Capillary: 156 mg/dL — ABNORMAL HIGH (ref 70–99)
Glucose-Capillary: 157 mg/dL — ABNORMAL HIGH (ref 70–99)
Glucose-Capillary: 144 mg/dL — ABNORMAL HIGH (ref 70–99)
Glucose-Capillary: 164 mg/dL — ABNORMAL HIGH (ref 70–99)

## 2018-02-05 LAB — BASIC METABOLIC PANEL
Anion gap: 13 (ref 5–15)
Anion gap: 10 (ref 5–15)
BUN: 61 mg/dL — AB (ref 8–23)
BUN: 66 mg/dL — ABNORMAL HIGH (ref 8–23)
CO2: 33 mmol/L — ABNORMAL HIGH (ref 22–32)
CO2: 33 mmol/L — ABNORMAL HIGH (ref 22–32)
Calcium: 8.6 mg/dL — ABNORMAL LOW (ref 8.9–10.3)
Calcium: 8.6 mg/dL — ABNORMAL LOW (ref 8.9–10.3)
Chloride: 107 mmol/L (ref 98–111)
Chloride: 111 mmol/L (ref 98–111)
Creatinine, Ser: 1.15 mg/dL — ABNORMAL HIGH (ref 0.44–1.00)
Creatinine, Ser: 1.18 mg/dL — ABNORMAL HIGH (ref 0.44–1.00)
GFR calc Af Amer: 50 mL/min — ABNORMAL LOW (ref >60)
GFR calc Af Amer: 48 mL/min — ABNORMAL LOW (ref >60)
GFR calc non Af Amer: 43 mL/min — ABNORMAL LOW (ref >60)
GFR calc non Af Amer: 42 mL/min — ABNORMAL LOW (ref >60)
Glucose, Bld: 184 mg/dL — ABNORMAL HIGH (ref 70–99)
Glucose, Bld: 184 mg/dL — ABNORMAL HIGH (ref 70–99)
Potassium: 2.9 mmol/L — ABNORMAL LOW (ref 3.5–5.1)
Potassium: 3.5 mmol/L (ref 3.5–5.1)
SODIUM: 153 mmol/L — AB (ref 135–145)
Sodium: 154 mmol/L — ABNORMAL HIGH (ref 135–145)

## 2018-02-05 LAB — MAGNESIUM: MAGNESIUM: 2.4 mg/dL (ref 1.7–2.4)

## 2018-02-05 MED ORDER — POTASSIUM CHLORIDE 20 MEQ PO PACK
40.0000 meq | PACK | Freq: Two times a day (BID) | ORAL | Status: AC
  Administered 2018-02-05 – 2018-02-06 (×3): 40 meq via ORAL
  Filled 2018-02-05 (×3): qty 2

## 2018-02-05 MED ORDER — FREE WATER
400.0000 mL | Freq: Three times a day (TID) | Status: DC
  Administered 2018-02-05 – 2018-02-11 (×18): 400 mL

## 2018-02-05 MED ORDER — POTASSIUM CHLORIDE 20 MEQ/15ML (10%) PO SOLN
40.0000 meq | Freq: Once | ORAL | Status: AC
  Administered 2018-02-05: 40 meq
  Filled 2018-02-05: qty 30

## 2018-02-05 MED ORDER — MODAFINIL 100 MG PO TABS
100.0000 mg | ORAL_TABLET | Freq: Every day | ORAL | Status: DC
  Administered 2018-02-06 – 2018-02-15 (×9): 100 mg via ORAL
  Filled 2018-02-05 (×9): qty 1

## 2018-02-05 NOTE — Progress Notes (Signed)
.   NAMCaprice Vazquez:  Kelli Vazquez, MRN:  [id number], DOB:  26-Feb-1929, LOS: 9 ADMISSION DATE:  01/27/2018, CONSULTATION DATE:  1/26  REFERRING MD:  Mesner, CHIEF COMPLAINT:  Acute encephalopathy   Brief History   85 y/o female admitted on 1/26 with embolic left MCA stroke.  Outside window for TPA or intervention.  Past Medical History  HTN, CHF  Significant Hospital Events   Admission/intubation 1/26 Increasing mass effect and hemorrhagic conversion Palliative care consulted to clarify goals of care.  Consults:  PCCM Neurology  Procedures:  1/26 intubated>>  Significant Diagnostic Tests:  CTA 1/26: 1. Complete occlusion of the left M1 segment due to an embolus. Focal narrowing and irregularity of the mid cervical ICA on the left with diameter of 2.6 mm. This probably represents ulcerated soft plaque and this could be a source of embolus. MRI brain 1/27> large ischemic left MCA stroke, no midline shift ECHO 1/27> EF 55-65%, moderate LVH pattern CT head 1/28> evolving L MCA infarct with new 2mm midline shift CXR 1/31- bilateral infiltrates and bilateral pleural effusions CXR 2/1 - BL vascular congestion ETT 1 cm from carina   Micro Data:  1/26 MRSA> neg  Antimicrobials:  None  Interim history/subjective:  Off sedation.   Objective   Blood pressure (!) 116/53, pulse 86, temperature 98.2 F (36.8 C), temperature source Axillary, resp. rate 20, height 5\' 3"  (1.6 m), weight 66.9 kg, SpO2 97 %.    Vent Mode: CPAP;PSV FiO2 (%):  [30 %] 30 % Set Rate:  [20 bmp] 20 bmp Vt Set:  [320 mL] 320 mL PEEP:  [5 cmH20] 5 cmH20 Pressure Support:  [10 cmH20] 10 cmH20 Plateau Pressure:  [13 cmH20-16 cmH20] 13 cmH20   Intake/Output Summary (Last 24 hours) at 02/05/2018 0940 Last data filed at 02/05/2018 [id number] Gross per 24 hour  Intake 1969.92 ml  Output 1100 ml  Net 869.92 ml   Filed Weights   02/03/18 0248 02/04/18 0500 02/05/18 0558  Weight: 67.8 kg 67.7 kg 66.9 kg     Examination:  General appearance: Ill-appearing elderly woman, mechanically ventilated Eyes: Opens eyes spontaneously, pupils equal and do react HENT: ET tube in place Lungs: Clear bilaterally, decreased at the bases, no wheezing, no crackles CV: I irregularly irregular, no murmur Abdomen soft, nondistended, positive bowel sounds Extremities: 1+ lower extremity edema Skin: No rash Neuro: opens eyes to command and follows commands to visual cues on L side. R side flaccid.   Resolved Hospital Problem list    Hypotension, AKI   Assessment & Plan:   Critically ill due to Acute respiratory failure with hypoxia requiring mechanical ventilation related to inability to protect airway. Encephalopathy due to Acute L MCA CVA with midline shift - R hemiplegia, neglect and possible aphasia Rate controlled Afib. Diastolic heart failure. Hypernatremia Hypokalemia from diuretics  PLAN  Continue full ventilatory support with daily weaning trials but mental status will ultimately dictate how patient is likely to tolerate liberation for ventilator. Decrease frequency of neurovitals and start Provigil - large stroke may also cause depressed LOC Hold further diuresis Prognosis for long term recovery is poor and patient likely to succumb promptly to aspiration event following extubation. Care needs likely to exceed that which the family is able to provide in the home. Appreciate Palliative care's assistance in navigating goals of care. Increase free water.   Best practice:  Diet: Enteral Nutrition Pain/Anxiety/Delirium protocol (if indicated): PAD RASS goal 0 to -1 VAP protocol (if indicated): Yes DVT prophylaxis: SCD,  sq Heparin GI prophylaxis: Pantoprazole  Glucose control: Monitor CBG Mobility: bed rest Code Status: full Family Communication: discussed with daughter at bedside 2/3 Disposition: Continue ICU care  Labs   CBC: Recent Labs  Lab 02/01/18 0519 02/02/18 0609  02/03/18 1017 02/04/18 0143 02/05/18 0513  WBC 13.8* 16.1* 14.2* 14.3* 12.1*  NEUTROABS  --   --  11.8*  --   --   HGB 11.1* 11.7* 12.2 12.4 12.0  HCT 36.1 38.3 38.7 39.7 39.9  MCV 92.3 92.3 91.9 92.8 92.8  PLT 260 308 363 381 383    Basic Metabolic Panel: Recent Labs  Lab 01/29/18 1636  01/31/18 0440 02/01/18 0519 02/02/18 0609 02/03/18 1017 02/04/18 0143 02/05/18 0513  NA 137   < > 143 146* 147* 148* 151* 153*  K 2.8*   < > 3.3* 4.3 3.5 3.2* 3.0* 2.9*  CL 102   < > 108 110 106 106 106 107  CO2 22   < > 26 27 27 29  32 33*  GLUCOSE 128*   < > 161* 154* 160* 187* 180* 184*  BUN 36*   < > 36* 37* 44* 50* 60* 61*  CREATININE 1.14*   < > 1.01* 0.92 1.00 1.06* 1.13* 1.15*  CALCIUM 8.2*   < > 8.0* 8.4* 8.8* 8.9 8.8* 8.6*  MG 2.1  --  2.2  --   --   --   --  2.4  PHOS 3.4  --  3.0  --   --   --   --   --    < > = values in this interval not displayed.   GFR: Estimated Creatinine Clearance: 32.3 mL/min (A) (by C-G formula based on SCr of 1.15 mg/dL (H)). Recent Labs  Lab 02/02/18 0609 02/03/18 1017 02/04/18 0143 02/05/18 0513  WBC 16.1* 14.2* 14.3* 12.1*    Liver Function Tests: No results for input(s): AST, ALT, ALKPHOS, BILITOT, PROT, ALBUMIN in the last 168 hours. No results for input(s): LIPASE, AMYLASE in the last 168 hours. No results for input(s): AMMONIA in the last 168 hours.  ABG    Component Value Date/Time   PHART 7.583 (H) 01/28/2018 0615   PCO2ART 25.8 (L) 01/28/2018 0615   PO2ART 92.3 01/28/2018 0615   HCO3 24.4 01/28/2018 0615   O2SAT 97.6 01/28/2018 0615     Coagulation Profile: No results for input(s): INR, PROTIME in the last 168 hours.  Cardiac Enzymes: No results for input(s): CKTOTAL, CKMB, CKMBINDEX, TROPONINI in the last 168 hours.  HbA1C: Hgb A1c MFr Bld  Date/Time Value Ref Range Status  01/28/2018 06:39 AM 5.5 4.8 - 5.6 % Final    Comment:    (NOTE) Pre diabetes:          5.7%-6.4% Diabetes:              >6.4% Glycemic  control for   <7.0% adults with diabetes     CBG: Recent Labs  Lab 02/04/18 1545 02/04/18 1946 02/04/18 2322 02/05/18 0333 02/05/18 0746  GLUCAP 178* 148* 170* 159* 156*    CRITICAL CARE Performed by: Lynnell Catalan   Total critical care time: 35 minutes  Critical care time was exclusive of separately billable procedures and treating other patients.  Critical care was necessary to treat or prevent imminent or life-threatening deterioration.  Critical care was time spent personally by me on the following activities: development of treatment plan with patient and/or surrogate as well as nursing, discussions with consultants, evaluation of patient's  response to treatment, examination of patient, obtaining history from patient or surrogate, ordering and performing treatments and interventions, ordering and review of laboratory studies, ordering and review of radiographic studies, pulse oximetry, re-evaluation of patient's condition and participation in multidisciplinary rounds.  Lynnell Catalanavi Jessabelle Markiewicz, MD [facility] ICU Physician [facility]  Pager: [phone] Mobile: [phone] After hours: [phone].

## 2018-02-05 NOTE — Progress Notes (Signed)
eLink Physician-Brief Progress Note Patient Name: Kelli Vazquez DOB: 04-15-1929 MRN: [id number]   Date of Service  02/05/2018  HPI/Events of Note  K+ = 2.9 and Creatinine = 1.15.   eICU Interventions  Will order: 1. Replace K+. 2. Repeat BMP at 12 noon.      Intervention Category Major Interventions: Electrolyte abnormality - evaluation and management  Jjesus Dingley Eugene 02/05/2018, 6:35 AM

## 2018-02-05 NOTE — Progress Notes (Addendum)
Daily Progress Note   Patient Name: Kelli Vazquez       Date: 02/05/2018 DOB: Jul 09, 1929  Age: 85 y.o. MRN#: [id number] Attending Physician: Collene Gobble, MD Primary Care Physician: No primary care provider on file. Admit Date: 01/29/18  Reason for Consultation/Follow-up: Establishing goals of care  Subjective: Patient resting upon arrival to room. Appears comfortable. Remains on ventilator.   GOC:  F/u GOC with two daughters at bedside, Kandis Nab. Daughters decline need for spanish interpreter.   I introduced Palliative Medicine as specialized medical care for people living with serious illness. It focuses on providing relief from the symptoms and stress of a serious illness.  We discussed a brief life review of the patient. Prior to hospitalization, patient living home and independently. Daughters share that their sister died in 2017-01-29, and their mother has "not been the same" since their sisters death. One other sibling living in [place]   Discussed events leading up to admission and course of hospitalization including diagnoses and interventions.   I attempted to elicit values and goals of care important to the patient and daughters. Advanced directives, concepts specific to code status, artifical feeding and hydration were discussed. Discussed two pathways of care including continued aggressive interventions (trach/peg) versus compassionate extubation with a goal to NOT re-intubate if she further declined. Explained that their mother is weaning and will likely breath on her own, but with risk for aspiration/secretions following extubation with severity of her stroke.   Daughters agree that their mother would NOT want prolonged interventions including trach/peg/SNF  placement. They will do everything possible to ensure she remains out of a nursing facility. They speak of importance to get her home if possible.   Educated on the process of compassionate extubation with goal not to re-intubate. Explained watchful waiting period whether she will stabilize or further decline after extubation. Explained role of symptom management if she expresses signs of discomfort and distress after extubation including using medications for comfort. Daughters share their concern that "morphine killed my sister." (Sister was dying of terminal cancer). Attempted to educate on misconception of medications hastening this process but allowing comfort and relief from symptoms if their mother appeared to be suffering. Daughters will need further education with compassionate extubation is planned.   Daughters do speak of their understanding that  it could be a period of hours, days, or weeks. The unknown. Explained that we could further discuss a plan to discharge home IF she was stable to leave the hospital.   [name] shares that they are waiting for the neurologist to further talk with them about extubation tomorrow morning. She shares that they are considering Saturday. I encouraged them to gather any family members that they wish to visit with her prior to extubation. Reassured that I would also f/u in AM and further speak with them and care team regarding plan for extubation.   Spiritual family of [place]. Naby plans to call spanish-speaking priest tomorrow.   Questions and concerns were addressed. PMT contact information given.    Length of Stay: 9  Current Medications: Scheduled Meds:  .  stroke: mapping our early stages of recovery book   Does not apply Once  . aspirin  81 mg Per Tube Daily  . atorvastatin  20 mg Oral q1800  . chlorhexidine gluconate (MEDLINE KIT)  15 mL Mouth Rinse BID  . docusate  100 mg Per Tube BID  . feeding supplement (VITAL HIGH PROTEIN)  1,000 mL  Per Tube Q24H  . free water  400 mL Per Tube Q8H  . heparin injection (subcutaneous)  5,000 Units Subcutaneous Q8H  . mouth rinse  15 mL Mouth Rinse 10 times per day  . [START ON 02/06/2018] modafinil  100 mg Oral Daily  . pantoprazole sodium  40 mg Per Tube Daily  . polyethylene glycol  17 g Per Tube BID  . potassium chloride  40 mEq Oral BID    Continuous Infusions: . sodium chloride 10 mL/hr at 02/05/18 0600    PRN Meds: acetaminophen **OR** acetaminophen (TYLENOL) oral liquid 160 mg/5 mL **OR** acetaminophen, bisacodyl, fentaNYL (SUBLIMAZE) injection, fentaNYL (SUBLIMAZE) injection  Physical Exam Vitals signs and nursing note reviewed.  Constitutional:      Interventions: She is intubated.  HENT:     Head: Normocephalic and atraumatic.  Cardiovascular:     Rate and Rhythm: Normal rate. Rhythm irregularly irregular.  Pulmonary:     Effort: No tachypnea, accessory muscle usage or respiratory distress. She is intubated.  Abdominal:     Tenderness: There is no abdominal tenderness.  Skin:    General: Skin is warm and dry.  Neurological:     Mental Status: She is easily aroused.     Comments: Opens eyes spontaneously. Left sided movement.            Vital Signs: BP 106/70   Pulse 93   Temp 98.2 F (36.8 C) (Axillary)   Resp 20   Ht 5' 3"  (1.6 m)   Wt 66.9 kg   SpO2 98%   BMI 26.13 kg/m  SpO2: SpO2: 98 % O2 Device: O2 Device: Ventilator O2 Flow Rate:    Intake/output summary:   Intake/Output Summary (Last 24 hours) at 02/05/2018 1353 Last data filed at 02/05/2018 1638 Gross per 24 hour  Intake 1929.94 ml  Output 600 ml  Net 1329.94 ml   LBM: Last BM Date: 02/04/18 Baseline Weight: Weight: 68 kg Most recent weight: Weight: 66.9 kg       Palliative Assessment/Data: PPS 30%   Flowsheet Rows     Most Recent Value  Intake Tab  Referral Department  Critical care  Unit at Time of Referral  ICU  Palliative Care Primary Diagnosis  Neurology  Date Notified   01/31/18  Palliative Care Type  New Palliative care  Reason  for referral  Clarify Goals of Care  Date of Admission  01/27/18  Date first seen by Palliative Care  02/01/18  # of days Palliative referral response time  1 Day(s)  # of days IP prior to Palliative referral  4  Clinical Assessment  Palliative Performance Scale Score  30%  Pain Max last 24 hours  Not able to report  Pain Min Last 24 hours  Not able to report  Dyspnea Max Last 24 Hours  Not able to report  Dyspnea Min Last 24 hours  Not able to report  Nausea Max Last 24 Hours  Not able to report  Nausea Min Last 24 Hours  Not able to report  Anxiety Max Last 24 Hours  Not able to report  Anxiety Min Last 24 Hours  Not able to report  Other Max Last 24 Hours  Not able to report  Psychosocial & Spiritual Assessment  Palliative Care Outcomes  Patient/Family meeting held?  Yes  Who was at the meeting?  dtr [place]  Provided psychosocial or spiritual support  Patient/Family wishes: Interventions discontinued/not started   [facility]  Palliative Care follow-up planned  Yes, Facility      Patient Active Problem List   Diagnosis Date Noted  . Goals of care, counseling/discussion   . Palliative care by specialist   . Acute ischemic left MCA stroke ([place]) 01/27/2018  . Respiratory failure requiring intubation [facility])     Palliative Care Assessment & Plan   Patient Profile: 85 y.o. female  with past medical history of hypertension, congestive heart failure, atrial fib, history of CVA admitted on 01/27/2018 with cute MCA infarct.  Patient lives alone, and was found down on her floor the next morning.  She opens her eyes to voice and stimulation but is inconsistently following commands. Consult ordered for goals of care in the setting of CVA, respiratory failure.   Assessment: Acute respiratory failure with hypoxia requiring mechanical ventilation Acute left MCA CVA with midline shift Right hemiplegia    Possible aphasia Afib Diastolic heart failure   Recommendations/Plan:  DNR. Continue current plan of care.  F/u GOC with daughters [name] and [place]) at bedside. Daughters confirm DNR and their joint decision against prolonged heroic interventions including trach/peg placement.   Discussed in detail compassionate extubation with goal of NO re-intubation if she should decline when extubated. Discussed comfort measures and symptom management medications. Daughters hesitant with thought of using morphine (sharing that morphine killed their sister). Educated on misconception but daughters will need more education when decision is made to extubate.   Daughters speak of f/u with neurologist in AM to further discuss plan for extubation? Updated Dr. Lynetta Mare who will further discuss this with family. Today is day 9 of intubation.  Daughters speak of importance of keeping her out of SNF with goal to take her home if possible. Explained that we will further discuss this plan if she is stable post-extubation.   PMT will follow.    Code Status: DNR   Code Status Orders  (From admission, onward)         Start     Ordered   02/03/18 1041  Do not attempt resuscitation (DNR)  Continuous    Question Answer Comment  In the event of cardiac or respiratory ARREST Do not call a "code blue"   In the event of cardiac or respiratory ARREST Do not perform Intubation, CPR, defibrillation or ACLS   In the event of cardiac or  respiratory ARREST Use medication by any route, position, wound care, and other measures to relive pain and suffering. May use oxygen, suction and manual treatment of airway obstruction as needed for comfort.      02/03/18 1040        Code Status History    Date Active Date Inactive Code Status Order ID Comments User Context   01/27/2018 1830 02/03/2018 1040 Full Code [id number]  Velna Ochs, MD ED       Prognosis:   Poor prognosis  Discharge Planning:  To Be  Determined  Care plan was discussed with daughters, RN, Dr. Lynetta Mare  Thank you for allowing the Palliative Medicine Team to assist in the care of this patient.   Time In: 1340 Time Out: 1445 Total Time 65 Prolonged Time Billed yes      Greater than 50%  of this time was spent counseling and coordinating care related to the above assessment and plan.  Ihor Dow, FNP-C Palliative Medicine Team  Phone: [phone] Fax: [phone]  Please contact Palliative Medicine Team phone at [phone] for questions and concerns.

## 2018-02-06 LAB — GLUCOSE, CAPILLARY
Glucose-Capillary: 156 mg/dL — ABNORMAL HIGH (ref 70–99)
Glucose-Capillary: 167 mg/dL — ABNORMAL HIGH (ref 70–99)
Glucose-Capillary: 153 mg/dL — ABNORMAL HIGH (ref 70–99)
Glucose-Capillary: 148 mg/dL — ABNORMAL HIGH (ref 70–99)
Glucose-Capillary: 170 mg/dL — ABNORMAL HIGH (ref 70–99)
Glucose-Capillary: 151 mg/dL — ABNORMAL HIGH (ref 70–99)

## 2018-02-06 LAB — BASIC METABOLIC PANEL
Anion gap: 9 (ref 5–15)
BUN: 60 mg/dL — ABNORMAL HIGH (ref 8–23)
CO2: 33 mmol/L — ABNORMAL HIGH (ref 22–32)
Calcium: 8.5 mg/dL — ABNORMAL LOW (ref 8.9–10.3)
Chloride: 112 mmol/L — ABNORMAL HIGH (ref 98–111)
Creatinine, Ser: 1.1 mg/dL — ABNORMAL HIGH (ref 0.44–1.00)
GFR calc Af Amer: 53 mL/min — ABNORMAL LOW (ref >60)
GFR calc non Af Amer: 45 mL/min — ABNORMAL LOW (ref >60)
Glucose, Bld: 194 mg/dL — ABNORMAL HIGH (ref 70–99)
Potassium: 3.9 mmol/L (ref 3.5–5.1)
Sodium: 154 mmol/L — ABNORMAL HIGH (ref 135–145)

## 2018-02-06 NOTE — Progress Notes (Signed)
.   NAMCaprice Vazquez:  Danay Graeff, MRN:  [id number], DOB:  07-12-1929, LOS: 10 ADMISSION DATE:  01/27/2018, CONSULTATION DATE:  1/26  REFERRING MD:  Mesner, CHIEF COMPLAINT:  Acute encephalopathy   Brief History   85 y/o female admitted on 1/26 with embolic left MCA stroke.  Outside window for TPA or intervention.  Past Medical History  HTN, CHF  Significant Hospital Events   Admission/intubation 1/26 Increasing mass effect and hemorrhagic conversion Palliative care consulted to clarify goals of care.  Consults:  PCCM Neurology  Procedures:  1/26 intubated>>  Significant Diagnostic Tests:  CTA 1/26: 1. Complete occlusion of the left M1 segment due to an embolus. Focal narrowing and irregularity of the mid cervical ICA on the left with diameter of 2.6 mm. This probably represents ulcerated soft plaque and this could be a source of embolus. MRI brain 1/27> large ischemic left MCA stroke, no midline shift ECHO 1/27> EF 55-65%, moderate LVH pattern CT head 1/28> evolving L MCA infarct with new 2mm midline shift CXR 1/31- bilateral infiltrates and bilateral pleural effusions CXR 2/1 - BL vascular congestion ETT 1 cm from carina   Micro Data:  1/26 MRSA> neg  Antimicrobials:  None  Interim history/subjective:  Off sedation.  No significant change in exam now day 11 post event.  Objective   Blood pressure (!) 117/58, pulse 85, temperature 98.6 F (37 C), temperature source Axillary, resp. rate (!) 24, height 5\' 3"  (1.6 m), weight 72.9 kg, SpO2 98 %.    Vent Mode: PRVC FiO2 (%):  [30 %-97 %] 30 % Set Rate:  [20 bmp] 20 bmp Vt Set:  [320 mL] 320 mL PEEP:  [5 cmH20] 5 cmH20 Plateau Pressure:  [15 cmH20-18 cmH20] 18 cmH20   Intake/Output Summary (Last 24 hours) at 02/06/2018 0902 Last data filed at 02/06/2018 0600 Gross per 24 hour  Intake 839.83 ml  Output 1100 ml  Net -260.17 ml   Filed Weights   02/04/18 0500 02/05/18 0558 02/06/18 0500  Weight: 67.7 kg 66.9 kg 72.9 kg     Examination:  General appearance: Ill-appearing elderly woman, mechanically ventilated Eyes: Opens eyes spontaneously, pupils equal and do react HENT: ET tube in place Lungs: Clear bilaterally, decreased at the bases, no wheezing, no crackles CV: I irregularly irregular, no murmur Abdomen soft, nondistended, positive bowel sounds Extremities: 1+ lower extremity edema Skin: No rash Neuro: opens eyes to command and follows commands to visual cues on L side. R side flaccid.   Resolved Hospital Problem list    Hypotension, AKI   Assessment & Plan:   Critically ill due to Acute respiratory failure with hypoxia requiring mechanical ventilation related to inability to protect airway. Encephalopathy due to Acute L MCA CVA with midline shift - R hemiplegia, neglect and possible aphasia Rate controlled Afib. Diastolic heart failure. Hypernatremia Hypokalemia from diuretics  PLAN  Continue full ventilatory support with daily weaning trials but mental status will ultimately dictate how patient is likely to tolerate liberation for ventilator. Decrease frequency of neurovitals and start Provigil - large stroke may also cause depressed LOC Hold further diuresis Prognosis for long term recovery is poor and patient likely to succumb promptly to aspiration event following extubation. Care needs likely to exceed that which the family is able to provide in the home. Appreciate Palliative care's assistance in navigating goals of care. Increased free water.   Best practice:  Diet: Enteral Nutrition Pain/Anxiety/Delirium protocol (if indicated): PAD RASS goal 0 to -1 VAP protocol (if  indicated): Yes DVT prophylaxis: SCD, sq Heparin GI prophylaxis: Pantoprazole  Glucose control: Monitor CBG Mobility: bed rest Code Status: full Family Communication: discussed with daughter at bedside 2/3 Disposition: I discussed patient's prognosis and goals of care with daughter again today and the family  have decided to proceed with compassionate extubation on 2/8.   Labs   CBC: Recent Labs  Lab 02/01/18 0519 02/02/18 0609 02/03/18 1017 02/04/18 0143 02/05/18 0513  WBC 13.8* 16.1* 14.2* 14.3* 12.1*  NEUTROABS  --   --  11.8*  --   --   HGB 11.1* 11.7* 12.2 12.4 12.0  HCT 36.1 38.3 38.7 39.7 39.9  MCV 92.3 92.3 91.9 92.8 92.8  PLT 260 308 363 381 383    Basic Metabolic Panel: Recent Labs  Lab 01/31/18 0440  02/03/18 1017 02/04/18 0143 02/05/18 0513 02/05/18 1116 02/06/18 0530  NA 143   < > 148* 151* 153* 154* 154*  K 3.3*   < > 3.2* 3.0* 2.9* 3.5 3.9  CL 108   < > 106 106 107 111 112*  CO2 26   < > 29 32 33* 33* 33*  GLUCOSE 161*   < > 187* 180* 184* 184* 194*  BUN 36*   < > 50* 60* 61* 66* 60*  CREATININE 1.01*   < > 1.06* 1.13* 1.15* 1.18* 1.10*  CALCIUM 8.0*   < > 8.9 8.8* 8.6* 8.6* 8.5*  MG 2.2  --   --   --  2.4  --   --   PHOS 3.0  --   --   --   --   --   --    < > = values in this interval not displayed.   GFR: Estimated Creatinine Clearance: 35.1 mL/min (A) (by C-G formula based on SCr of 1.1 mg/dL (H)). Recent Labs  Lab 02/02/18 0609 02/03/18 1017 02/04/18 0143 02/05/18 0513  WBC 16.1* 14.2* 14.3* 12.1*    Liver Function Tests: No results for input(s): AST, ALT, ALKPHOS, BILITOT, PROT, ALBUMIN in the last 168 hours. No results for input(s): LIPASE, AMYLASE in the last 168 hours. No results for input(s): AMMONIA in the last 168 hours.  ABG    Component Value Date/Time   PHART 7.583 (H) 01/28/2018 0615   PCO2ART 25.8 (L) 01/28/2018 0615   PO2ART 92.3 01/28/2018 0615   HCO3 24.4 01/28/2018 0615   O2SAT 97.6 01/28/2018 0615     Coagulation Profile: No results for input(s): INR, PROTIME in the last 168 hours.  Cardiac Enzymes: No results for input(s): CKTOTAL, CKMB, CKMBINDEX, TROPONINI in the last 168 hours.  HbA1C: Hgb A1c MFr Bld  Date/Time Value Ref Range Status  01/28/2018 06:39 AM 5.5 4.8 - 5.6 % Final    Comment:    (NOTE) Pre  diabetes:          5.7%-6.4% Diabetes:              >6.4% Glycemic control for   <7.0% adults with diabetes     CBG: Recent Labs  Lab 02/05/18 1537 02/05/18 1931 02/05/18 2320 02/06/18 0331 02/06/18 0739  GLUCAP 157* 144* 164* 156* 167*    CRITICAL CARE Performed by: Lynnell Catalan   Total critical care time: 35 minutes  Critical care time was exclusive of separately billable procedures and treating other patients.  Critical care was necessary to treat or prevent imminent or life-threatening deterioration.  Critical care was time spent personally by me on the following activities: development of treatment  plan with patient and/or surrogate as well as nursing, discussions with consultants, evaluation of patient's response to treatment, examination of patient, obtaining history from patient or surrogate, ordering and performing treatments and interventions, ordering and review of laboratory studies, ordering and review of radiographic studies, pulse oximetry, re-evaluation of patient's condition and participation in multidisciplinary rounds.  Lynnell Catalanavi Mykayla Brinton, MD [facility] ICU Physician [facility]  Pager: [phone] Mobile: [phone] After hours: [phone].

## 2018-02-06 NOTE — Progress Notes (Addendum)
Daily Progress Note   Patient Name: Kelli Vazquez       Date: 02/06/2018 DOB: [birth year]  Age: 85 y.o. MRN#: [id number] Attending Physician: Collene Gobble, MD Primary Care Physician: No primary care provider on file. Admit Date: 01/27/2018  Reason for Consultation/Follow-up: Establishing goals of care  Subjective: 85 year old female, intubated, lying in bed. Daughter at bedside.   Spoke with patient's daughter, Kelli Vazquez, at bedside. She recalls her conversation with Dr. Lynetta Mare this morning, in which they talked about how [name] is unlikely to improve. Kelli Vazquez states that the family wishes to wait until Saturday (2/8) for compassionate extubation.  Encouraged Kelli Vazquez to call with questions or concerns and offered chaplain support.    Assessment: Acute Respiratory failure with hypoxia requiring mechanical ventilation Acute Left MCA CVA with midline shift Right hemiplegia Atrial Fibrillation Diastolic Heart Failure    Patient Profile/HPI: 85 y.o.femalewith past medical history of hypertension, congestive heart failure, atrial fib, history of CVAadmitted on 1/26/2020with cute MCA infarct. Patient lives alone, and was found down on her floor the next morning. She opens her eyes to voice and stimulation but is inconsistently following commands. Consult ordered for goals of care in the setting of CVA with respiratory failure.    Length of Stay: 10  Current Medications: Scheduled Meds:  .  stroke: mapping our early stages of recovery book   Does not apply Once  . aspirin  81 mg Per Tube Daily  . atorvastatin  20 mg Oral q1800  . chlorhexidine gluconate (MEDLINE KIT)  15 mL Mouth Rinse BID  . docusate  100 mg Per Tube BID  . feeding supplement (VITAL HIGH PROTEIN)  1,000 mL Per Tube  Q24H  . free water  400 mL Per Tube Q8H  . heparin injection (subcutaneous)  5,000 Units Subcutaneous Q8H  . mouth rinse  15 mL Mouth Rinse 10 times per day  . modafinil  100 mg Oral Daily  . pantoprazole sodium  40 mg Per Tube Daily  . polyethylene glycol  17 g Per Tube BID    Continuous Infusions: . sodium chloride 10 mL/hr at 02/06/18 0900    PRN Meds: acetaminophen **OR** acetaminophen (TYLENOL) oral liquid 160 mg/5 mL **OR** acetaminophen, bisacodyl, fentaNYL (SUBLIMAZE) injection, fentaNYL (SUBLIMAZE) injection  Physical Exam    General: elderly female,  intubated, lying in bed Skin: ecchymosis and abrasion over right periorbital region Respiratory: bilateral, diffuse crackles Heart: irregular rhythm, no murmurs/rubs/gallops Abdomen: soft, non-distended   Vital Signs: BP (!) 117/58   Pulse 85   Temp 98.6 F (37 C) (Axillary)   Resp (!) 24   Ht 5' 3"  (1.6 m)   Wt 72.9 kg   SpO2 98%   BMI 28.47 kg/m  SpO2: SpO2: 98 % O2 Device: O2 Device: Ventilator O2 Flow Rate:    Intake/output summary:   Intake/Output Summary (Last 24 hours) at 02/06/2018 1020 Last data filed at 02/06/2018 0900 Gross per 24 hour  Intake 919.82 ml  Output 1100 ml  Net -180.18 ml   LBM: Last BM Date: 02/06/18 Baseline Weight: Weight: 68 kg Most recent weight: Weight: 72.9 kg       Palliative Assessment/Data: 20%    Flowsheet Rows     Most Recent Value  Intake Tab  Referral Department  Critical care  Unit at Time of Referral  ICU  Palliative Care Primary Diagnosis  Neurology  Date Notified  01/31/18  Palliative Care Type  New Palliative care  Reason for referral  Clarify Goals of Care  Date of Admission  01/27/18  Date first seen by Palliative Care  02/01/18  # of days Palliative referral response time  1 Day(s)  # of days IP prior to Palliative referral  4  Clinical Assessment  Palliative Performance Scale Score  20%  Pain Max last 24 hours  Not able to report  Pain Min Last 24  hours  Not able to report  Dyspnea Max Last 24 Hours  Not able to report  Dyspnea Min Last 24 hours  Not able to report  Nausea Max Last 24 Hours  Not able to report  Nausea Min Last 24 Hours  Not able to report  Anxiety Max Last 24 Hours  Not able to report  Anxiety Min Last 24 Hours  Not able to report  Other Max Last 24 Hours  Not able to report  Psychosocial & Spiritual Assessment  Palliative Care Outcomes  Patient/Family meeting held?  Yes  Who was at the meeting?  dtr [place]  ACP counseling assistance, Provided psychosocial or spiritual support, Provided end of life care assistance, Clarified goals of care  Patient/Family wishes: Interventions discontinued/not started   [place] follow-up planned  Yes, Facility      Patient Active Problem List   Diagnosis Date Noted  . Goals of care, counseling/discussion   . Palliative care by specialist   . Acute ischemic left MCA stroke ([place]) 01/27/2018  . Respiratory failure requiring intubation [facility])     Palliative Care Plan    Recommendations/Plan:  Daughters have made decision for DNR and NO trach/peg. Compassionate extubation planned for Saturday, 10-Feb-2022  Full Course Treatment, today is day 10 of intubation.  Symptom management per primary team  PMT will continue to follow and support holistically. PMT discussed process of compassionate extubation on 2/4 with daughters. Further discussion needed on process of compassionate extubation and symptom management prior to extubation.   Goals of Care and Additional Recommendations:  Limitations on Scope of Treatment: Full Scope Treatment  Code Status:  DNR  Prognosis:   Unable to determine, poor prognosis given acute left CVA with right hemiplegia and high risk for aspiration.    Discharge Planning:  To Be Determined, compassionate extubation on Feb 10, 2022; hospital death vs. hospice  Care plan was  discussed with patient's daughter,  Kelli Vazquez  Thank you for allowing the Palliative Medicine Team to assist in the care of this patient.  Total time spent:  15 min     Greater than 50%  of this time was spent counseling and coordinating care related to the above assessment and plan.  Ferrel Logan, PA-S 02/06/2018   Ihor Dow, DNP FNP-C Palliative Medicine Team  Phone: [phone] Fax: [phone]  Please contact Palliative MedicineTeam phone at [phone] for questions and concerns between 7 am - 7 pm.   Please see AMION for individual provider pager numbers.

## 2018-02-07 LAB — GLUCOSE, CAPILLARY
Glucose-Capillary: 160 mg/dL — ABNORMAL HIGH (ref 70–99)
Glucose-Capillary: 160 mg/dL — ABNORMAL HIGH (ref 70–99)
Glucose-Capillary: 145 mg/dL — ABNORMAL HIGH (ref 70–99)
Glucose-Capillary: 151 mg/dL — ABNORMAL HIGH (ref 70–99)
Glucose-Capillary: 158 mg/dL — ABNORMAL HIGH (ref 70–99)
Glucose-Capillary: 151 mg/dL — ABNORMAL HIGH (ref 70–99)

## 2018-02-07 LAB — BASIC METABOLIC PANEL
Anion gap: 13 (ref 5–15)
BUN: 56 mg/dL — ABNORMAL HIGH (ref 8–23)
CO2: 29 mmol/L (ref 22–32)
Calcium: 8.4 mg/dL — ABNORMAL LOW (ref 8.9–10.3)
Chloride: 111 mmol/L (ref 98–111)
Creatinine, Ser: 1 mg/dL (ref 0.44–1.00)
GFR calc Af Amer: 59 mL/min — ABNORMAL LOW (ref >60)
GFR calc non Af Amer: 51 mL/min — ABNORMAL LOW (ref >60)
Glucose, Bld: 174 mg/dL — ABNORMAL HIGH (ref 70–99)
Potassium: 3.8 mmol/L (ref 3.5–5.1)
Sodium: 153 mmol/L — ABNORMAL HIGH (ref 135–145)

## 2018-02-07 LAB — SODIUM, URINE, RANDOM: Sodium, Ur: 31 mmol/L

## 2018-02-07 LAB — OSMOLALITY: OSMOLALITY: 333 mosm/kg — AB (ref 275–295)

## 2018-02-07 LAB — OSMOLALITY, URINE: Osmolality, Ur: 810 mOsm/kg (ref 300–900)

## 2018-02-07 NOTE — Progress Notes (Signed)
.   NAMCaprice Renshaw:  Kelli Vazquez, MRN:  [id number], DOB:  [birth year], LOS: 11 ADMISSION DATE:  01/27/2018, CONSULTATION DATE:  1/26  REFERRING MD:  Mesner, CHIEF COMPLAINT:  Acute encephalopathy   Brief History   85 y/o female admitted on 1/26 with embolic left MCA stroke.  Outside window for TPA or intervention.  Past Medical History  HTN, CHF  Significant Hospital Events   Admission/intubation 1/26 Increasing mass effect and hemorrhagic conversion Palliative care consulted to clarify goals of care.  Consults:  PCCM Neurology Palliative  Procedures:  1/26 intubated>>  Significant Diagnostic Tests:  CTA 1/26: 1. Complete occlusion of the left M1 segment due to an embolus. Focal narrowing and irregularity of the mid cervical ICA on the left with diameter of 2.6 mm. This probably represents ulcerated soft plaque and this could be a source of embolus. MRI brain 1/27> large ischemic left MCA stroke, no midline shift ECHO 1/27> EF 55-65%, moderate LVH pattern CT head 1/28> evolving L MCA infarct with new 2mm midline shift CXR 1/31- bilateral infiltrates and bilateral pleural effusions CXR 2/1 - BL vascular congestion ETT 1 cm from carina   Micro Data:  1/26 MRSA> neg  Antimicrobials:  None  Interim history/subjective:  Off sedation.  No significant change in exam now day 11 post event.  Objective   Blood pressure 122/63, pulse 81, temperature 98 F (36.7 C), temperature source Axillary, resp. rate (!) 22, height 5\' 3"  (1.6 m), weight 72.8 kg, SpO2 97 %.    Vent Mode: PRVC FiO2 (%):  [30 %] 30 % Set Rate:  [20 bmp] 20 bmp Vt Set:  [320 mL] 320 mL PEEP:  [5 cmH20] 5 cmH20 Plateau Pressure:  [15 cmH20-17 cmH20] 16 cmH20   Intake/Output Summary (Last 24 hours) at 02/07/2018 1124 Last data filed at 02/07/2018 0500 Gross per 24 hour  Intake 643.99 ml  Output 1000 ml  Net -356.01 ml   Filed Weights   02/05/18 0558 02/06/18 0500 02/07/18 0500  Weight: 66.9 kg 72.9 kg 72.8 kg     Examination: General appearance: elderly female in NAD Eyes: Opens eyes spontaneously, PERRL HENT: ET tube in place Lungs: Clear bilateral breath sounds CV: Iirregularly irregular, no murmur. Rate controlled.  Abdomen soft, non-distended, non-tender Extremities: 1+ lower extremity edema Skin: No rash Neuro: Opens eyes spontaneously. Seems to be more awake. Will purse more in depth  flaccid.   Resolved Hospital Problem list    Hypotension, AKI   Assessment & Plan:   Acute respiratory failure with hypoxia requiring mechanical ventilation related to inability to protect airway. - Continue full vent support - Daily SBT - Mental status will prevent extubation - Holding further diuresis - Family would not want trach/PEG. Palliative extubation planned for 2/8. Full support up until that time. - DNR if arrests.  Encephalopathy due to Acute L MCA CVA with midline shift - R hemiplegia, neglect and possible aphasia - Neurochecks - Continue Provigil - Prognosis poor  Diastolic heart failure. Rate controlled Afib. - Telemetry monitoring  Hypernatremia: Remains elevated despite free water. - Check serum, urine osm and urine sodium. DI? Benefit from DDAVP? Doubt this is the case as urine is very concentrated.  - Will start some low rate D5W - Repeat BMP in AM   Hypokalemia from diuretics - No repletion needed today.  - Holding diuresis   Best practice:  Diet: Enteral Nutrition Pain/Anxiety/Delirium protocol (if indicated): PAD RASS goal 0 to -1 VAP protocol (if indicated): Yes DVT prophylaxis:  SCD, sq Heparin GI prophylaxis: Pantoprazole  Glucose control: Monitor CBG Mobility: bed rest Code Status: DNR Family Communication: No family bedside Disposition: ICU  Labs   CBC: Recent Labs  Lab 02/01/18 0519 02/02/18 0609 02/03/18 1017 02/04/18 0143 02/05/18 0513  WBC 13.8* 16.1* 14.2* 14.3* 12.1*  NEUTROABS  --   --  11.8*  --   --   HGB 11.1* 11.7* 12.2 12.4 12.0   HCT 36.1 38.3 38.7 39.7 39.9  MCV 92.3 92.3 91.9 92.8 92.8  PLT 260 308 363 381 383    Basic Metabolic Panel: Recent Labs  Lab 02/04/18 0143 02/05/18 0513 02/05/18 1116 02/06/18 0530 02/07/18 0408  NA 151* 153* 154* 154* 153*  K 3.0* 2.9* 3.5 3.9 3.8  CL 106 107 111 112* 111  CO2 32 33* 33* 33* 29  GLUCOSE 180* 184* 184* 194* 174*  BUN 60* 61* 66* 60* 56*  CREATININE 1.13* 1.15* 1.18* 1.10* 1.00  CALCIUM 8.8* 8.6* 8.6* 8.5* 8.4*  MG  --  2.4  --   --   --    GFR: Estimated Creatinine Clearance: 38.6 mL/min (by C-G formula based on SCr of 1 mg/dL). Recent Labs  Lab 02/02/18 0609 02/03/18 1017 02/04/18 0143 02/05/18 0513  WBC 16.1* 14.2* 14.3* 12.1*    Liver Function Tests: No results for input(s): AST, ALT, ALKPHOS, BILITOT, PROT, ALBUMIN in the last 168 hours. No results for input(s): LIPASE, AMYLASE in the last 168 hours. No results for input(s): AMMONIA in the last 168 hours.  ABG    Component Value Date/Time   PHART 7.583 (H) 01/28/2018 0615   PCO2ART 25.8 (L) 01/28/2018 0615   PO2ART 92.3 01/28/2018 0615   HCO3 24.4 01/28/2018 0615   O2SAT 97.6 01/28/2018 0615     Coagulation Profile: No results for input(s): INR, PROTIME in the last 168 hours.  Cardiac Enzymes: No results for input(s): CKTOTAL, CKMB, CKMBINDEX, TROPONINI in the last 168 hours.  HbA1C: Hgb A1c MFr Bld  Date/Time Value Ref Range Status  01/28/2018 06:39 AM 5.5 4.8 - 5.6 % Final    Comment:    (NOTE) Pre diabetes:          5.7%-6.4% Diabetes:              >6.4% Glycemic control for   <7.0% adults with diabetes     CBG: Recent Labs  Lab 02/06/18 1545 02/06/18 1928 02/06/18 2319 02/07/18 0316 02/07/18 0744  GLUCAP 148* 170* 151* 160* 160*    CRITICAL CARE  Critical care time 30 mins  Joneen RoachPaul , AGACNP-BC [facility] Pulmonary/Critical Care Pager [phone] or [phone]  02/07/2018 11:41 AM

## 2018-02-08 LAB — BASIC METABOLIC PANEL
Anion gap: 13 (ref 5–15)
BUN: 49 mg/dL — ABNORMAL HIGH (ref 8–23)
CO2: 27 mmol/L (ref 22–32)
Calcium: 8.5 mg/dL — ABNORMAL LOW (ref 8.9–10.3)
Chloride: 110 mmol/L (ref 98–111)
Creatinine, Ser: 0.99 mg/dL (ref 0.44–1.00)
GFR calc Af Amer: 60 mL/min — ABNORMAL LOW (ref >60)
GFR calc non Af Amer: 52 mL/min — ABNORMAL LOW (ref >60)
Glucose, Bld: 191 mg/dL — ABNORMAL HIGH (ref 70–99)
Potassium: 3.8 mmol/L (ref 3.5–5.1)
SODIUM: 150 mmol/L — AB (ref 135–145)

## 2018-02-08 LAB — GLUCOSE, CAPILLARY
Glucose-Capillary: 150 mg/dL — ABNORMAL HIGH (ref 70–99)
Glucose-Capillary: 152 mg/dL — ABNORMAL HIGH (ref 70–99)
Glucose-Capillary: 160 mg/dL — ABNORMAL HIGH (ref 70–99)
Glucose-Capillary: 148 mg/dL — ABNORMAL HIGH (ref 70–99)
Glucose-Capillary: 139 mg/dL — ABNORMAL HIGH (ref 70–99)
Glucose-Capillary: 134 mg/dL — ABNORMAL HIGH (ref 70–99)

## 2018-02-08 NOTE — Progress Notes (Addendum)
  Stopped by to see patient's daughters.  Per chart review, see that family is considering one-way extubation on 02/09/2018.  Daughter [name], stated "I think she might be a little bit better".  Also met her other daughter Raquel Sarna who is at the bedside.  Agreed to follow-up appointment on 02/09/2018 at 11 AM to FU one way extubation. CCM note reviewed and understand that family may proceed prior to 11am appt. If not, palliative medicine available to meet with family and assist  Thank you Romona Curls, NP

## 2018-02-08 NOTE — Progress Notes (Signed)
.   NAMCaprice Vazquez:  Kelli Vazquez, MRN:  [id number], DOB:  [birth year], LOS: 12 ADMISSION DATE:  01/27/2018, CONSULTATION DATE:  1/26  REFERRING MD:  Mesner, CHIEF COMPLAINT:  Acute encephalopathy   Brief History   85 y/o female admitted on 1/26 with embolic left MCA stroke.  Outside window for TPA or intervention.  Past Medical History  HTN, CHF  Significant Hospital Events   Admission/intubation 1/26 Increasing mass effect and hemorrhagic conversion Palliative care consulted to clarify goals of care.  Consults:  PCCM Neurology Palliative  Procedures:  1/26 intubated>>  Significant Diagnostic Tests:  CTA 1/26: 1. Complete occlusion of the left M1 segment due to an embolus. Focal narrowing and irregularity of the mid cervical ICA on the left with diameter of 2.6 mm. This probably represents ulcerated soft plaque and this could be a source of embolus. MRI brain 1/27> large ischemic left MCA stroke, no midline shift ECHO 1/27> EF 55-65%, moderate LVH pattern CT head 1/28> evolving L MCA infarct with new 2mm midline shift CXR 1/31- bilateral infiltrates and bilateral pleural effusions CXR 2/1 - BL vascular congestion ETT 1 cm from carina   Micro Data:  1/26 MRSA> neg  Antimicrobials:  None  Interim history/subjective:  Off sedation.  No significant change in exam now day 11 post event.  Objective   Blood pressure (!) 115/51, pulse 77, temperature 98.8 F (37.1 C), temperature source Axillary, resp. rate (!) 27, height 5\' 3"  (1.6 m), weight 74.3 kg, SpO2 99 %.    Vent Mode: PRVC FiO2 (%):  [30 %] 30 % Set Rate:  [20 bmp] 20 bmp Vt Set:  [320 mL] 320 mL PEEP:  [5 cmH20] 5 cmH20 Plateau Pressure:  [9 cmH20-24 cmH20] 15 cmH20   Intake/Output Summary (Last 24 hours) at 02/08/2018 0901 Last data filed at 02/08/2018 0800 Gross per 24 hour  Intake 1379.74 ml  Output 750 ml  Net 629.74 ml   Filed Weights   02/06/18 0500 02/07/18 0500 02/08/18 0500  Weight: 72.9 kg 72.8 kg 74.3 kg     Examination: General appearance: elderly female in NAD Eyes: Opens eyes spontaneously, PERRL HENT: ET tube in place Lungs: Clear bilateral breath sounds CV: Iirregularly irregular, no murmur. Rate controlled.  Abdomen soft, non-distended, non-tender Extremities: 1+ lower extremity edema Skin: No rash Neuro: Opens eyes spontaneously. More awake and able to mimic commands with 4-/5 strength in the LUE. 2/5 in LLE. Still plegic on right. Good trunk control.  Resolved Hospital Problem list    Hypotension, AKI   Assessment & Plan:   Acute respiratory failure with hypoxia requiring mechanical ventilation related to inability to protect airway. - Continue full vent support - Daily SBT - Family would not want trach/PEG. Palliative extubation planned for 2/8. Full support up until that time. - Mental status is such that she may tolerate extubation, but unlikely to have intact swallowing - thus likely to succumb to malnutrition and aspiration. - DNR if arrests.  Encephalopathy due to Acute L MCA CVA with midline shift - R hemiplegia, neglect and possible aphasia - Neurochecks q4h - Continue Provigil - Prognosis is guarded - I suspect that she will tolerate extubation but ability to manage secretions and swallowing are likely to be issues. - Will insert Cortrak today in anticipation of successful extubation as bridge to possible recovery of pharyngeal function.  Hypernatremia: Improving. - Continue current enteral free water.  - Wean D5W  Best practice:  Diet: Enteral Nutrition Pain/Anxiety/Delirium protocol (if indicated): PAD  RASS goal 0 to -1 VAP protocol (if indicated): Yes DVT prophylaxis: SCD, sq Heparin GI prophylaxis: Pantoprazole  Glucose control: Monitor CBG Mobility: bed rest Code Status: DNR Family Communication: No family bedside Disposition: ICU  Labs   CBC: Recent Labs  Lab 02/02/18 0609 02/03/18 1017 02/04/18 0143 02/05/18 0513  WBC 16.1* 14.2* 14.3*  12.1*  NEUTROABS  --  11.8*  --   --   HGB 11.7* 12.2 12.4 12.0  HCT 38.3 38.7 39.7 39.9  MCV 92.3 91.9 92.8 92.8  PLT 308 363 381 383    Basic Metabolic Panel: Recent Labs  Lab 02/05/18 0513 02/05/18 1116 02/06/18 0530 02/07/18 0408 02/08/18 0553  NA 153* 154* 154* 153* 150*  K 2.9* 3.5 3.9 3.8 3.8  CL 107 111 112* 111 110  CO2 33* 33* 33* 29 27  GLUCOSE 184* 184* 194* 174* 191*  BUN 61* 66* 60* 56* 49*  CREATININE 1.15* 1.18* 1.10* 1.00 0.99  CALCIUM 8.6* 8.6* 8.5* 8.4* 8.5*  MG 2.4  --   --   --   --    GFR: Estimated Creatinine Clearance: 39.4 mL/min (by C-G formula based on SCr of 0.99 mg/dL). Recent Labs  Lab 02/02/18 0609 02/03/18 1017 02/04/18 0143 02/05/18 0513  WBC 16.1* 14.2* 14.3* 12.1*    Liver Function Tests: No results for input(s): AST, ALT, ALKPHOS, BILITOT, PROT, ALBUMIN in the last 168 hours. No results for input(s): LIPASE, AMYLASE in the last 168 hours. No results for input(s): AMMONIA in the last 168 hours.  ABG    Component Value Date/Time   PHART 7.583 (H) 01/28/2018 0615   PCO2ART 25.8 (L) 01/28/2018 0615   PO2ART 92.3 01/28/2018 0615   HCO3 24.4 01/28/2018 0615   O2SAT 97.6 01/28/2018 0615     Coagulation Profile: No results for input(s): INR, PROTIME in the last 168 hours.  Cardiac Enzymes: No results for input(s): CKTOTAL, CKMB, CKMBINDEX, TROPONINI in the last 168 hours.  HbA1C: Hgb A1c MFr Bld  Date/Time Value Ref Range Status  01/28/2018 06:39 AM 5.5 4.8 - 5.6 % Final    Comment:    (NOTE) Pre diabetes:          5.7%-6.4% Diabetes:              >6.4% Glycemic control for   <7.0% adults with diabetes     CBG: Recent Labs  Lab 02/07/18 1644 02/07/18 1919 02/07/18 2329 02/08/18 0329 02/08/18 0749  GLUCAP 151* 158* 151* 150* 152*    CRITICAL CARE  Critical care time 30 mins  Joneen RoachPaul Hoffman, AGACNP-BC [facility] Pulmonary/Critical Care Pager [phone] or [phone]  02/08/2018 9:01 AM

## 2018-02-08 NOTE — Procedures (Signed)
Cortrak  Person Inserting Tube:  Kelli Vazquez, RD Tube Type:  Cortrak - 43 inches Tube Location:  Left nare Initial Placement:  Stomach Secured by: Bridle Technique Used to Measure Tube Placement:  Documented cm marking at nare/ corner of mouth Cortrak Secured At:  68 cm Procedure Comments:  Cortrak Tube Team Note:  Consult received to place a Cortrak feeding tube.   No x-ray is required. RN may begin using tube.   If the tube becomes dislodged please keep the tube and contact the Cortrak team at [email or web address] ([email or web address]) for replacement.  If after hours and replacement cannot be delayed, place a NG tube and confirm placement with an abdominal x-ray.   Romelle Starcher MS, RD, LDN, CNSC [phone] Pager  [phone] Weekend/On-Call Pager

## 2018-02-09 DIAGNOSIS — Z515 Encounter for palliative care: Secondary | ICD-10-CM

## 2018-02-09 LAB — GLUCOSE, CAPILLARY
GLUCOSE-CAPILLARY: 140 mg/dL — AB (ref 70–99)
Glucose-Capillary: 154 mg/dL — ABNORMAL HIGH (ref 70–99)
Glucose-Capillary: 170 mg/dL — ABNORMAL HIGH (ref 70–99)
Glucose-Capillary: 145 mg/dL — ABNORMAL HIGH (ref 70–99)
Glucose-Capillary: 155 mg/dL — ABNORMAL HIGH (ref 70–99)
Glucose-Capillary: 139 mg/dL — ABNORMAL HIGH (ref 70–99)

## 2018-02-09 NOTE — Progress Notes (Signed)
  Patient seen, chart reviewed.  Patient's daughters, Marylu Lund and Colin Broach, are at the bedside.  Plan was to tentatively remove from ventilator today.  CCM note from 02/08/2018 reviewed with CCM provider, Rober Minion, NP.  We both spoke to patient's daughters about withdrawal and what could potentially happen in terms of struggling to breathe, difficulty managing secretions and maintaining airway.  We also discussed role of medications to ensure respiratory comfort.  Family continues to verbalize no trach no PEG goal to go home if she can survive this hospitalization.  But they are nervous about withdrawal today and requesting more time.  We did prepare them that we are nearing the point where we will need to remove the ventilator as she has been intubated since 01/27/2018  In reviewing Dr. Percival Spanish note from 02/09/2018, family at one point was verbalizing desire to go forward with trach and PEG when talking with him.  He shared his concerns with them regarding that approach.  Again, things were left on hold for family to continue to process one-way extubation, likely end-of-life.  We will continue to follow with family on 02/10/2018.   Thank you, Eduard Roux, NP Time spent: 20 minutes Greater than 50% of the time spent was in counseling and coordination of care

## 2018-02-09 NOTE — Progress Notes (Signed)
RT note: patient placed on CPAP/PSV of 12/5 at 0732.  Currently tolerating well.  Will continue to monitor.  

## 2018-02-09 NOTE — Progress Notes (Signed)
.   NAMEDyonne Vazquez, MRN:  [id number], DOB:  February 02, 1929, LOS: 13 ADMISSION DATE:  01/27/2018, CONSULTATION DATE:  1/26  REFERRING MD:  Mesner, CHIEF COMPLAINT:  Acute encephalopathy   Brief History   85 y/o female admitted on 1/26 with embolic left MCA stroke.  Outside window for TPA or intervention.  Past Medical History  HTN, CHF  Significant Hospital Events   Admission/intubation 1/26 Increasing mass effect and hemorrhagic conversion Palliative care consulted to clarify goals of care.  Consults:  PCCM Neurology Palliative  Procedures:  1/26 intubated>>  Significant Diagnostic Tests:  CTA 1/26: 1. Complete occlusion of the left M1 segment due to an embolus. Focal narrowing and irregularity of the mid cervical ICA on the left with diameter of 2.6 mm. This probably represents ulcerated soft plaque and this could be a source of embolus. MRI brain 1/27> large ischemic left MCA stroke, no midline shift ECHO 1/27> EF 55-65%, moderate LVH pattern CT head 1/28> evolving L MCA infarct with new 37mm midline shift CXR 1/31- bilateral infiltrates and bilateral pleural effusions CXR 2/1 - BL vascular congestion ETT 1 cm from carina   Micro Data:  1/26 MRSA> neg  Antimicrobials:  None  Interim history/subjective:  Off sedation.  No significant change in exam now day 11 post event.  Objective   Blood pressure (!) 147/73, pulse 85, temperature (!) 97.2 F (36.2 C), temperature source Axillary, resp. rate (!) 27, height 5\' 3"  (1.6 m), weight 74.8 kg, SpO2 100 %.    Vent Mode: PSV;CPAP FiO2 (%):  [30 %] 30 % Set Rate:  [20 bmp] 20 bmp Vt Set:  [320 mL] 320 mL PEEP:  [5 cmH20] 5 cmH20 Pressure Support:  [12 cmH20-15 cmH20] 12 cmH20 Plateau Pressure:  [16 cmH20] 16 cmH20   Intake/Output Summary (Last 24 hours) at 02/09/2018 1014 Last data filed at 02/09/2018 0800 Gross per 24 hour  Intake 5719.5 ml  Output 1175 ml  Net 4544.5 ml   Filed Weights   02/07/18 0500 02/08/18 0500  02/09/18 0500  Weight: 72.8 kg 74.3 kg 74.8 kg    Examination: General appearance: Acutely ill appearing elderly female, NAD on the vent Eyes: Opens eyes spontaneously, PERRL HENT: [place]/AT, PERRL, EOM-I and MMM Lungs: CTA bilaterally CV: IRIR, Nl S1/S2 and -M/R/G Abdomen: Soft, NT, ND and +BS Extremities: 1+ lower extremity edema Skin: No rash Neuro: Opens eyes spontaneously. Not following commands  I reviewed CXR myself, ETT is in a good position  Resolved Hospital Problem list    Hypotension, AKI   Assessment & Plan:   Acute respiratory failure with hypoxia requiring mechanical ventilation related to inability to protect airway. - Begin PS trials - Family was not clear at all about one way extubation, they were expecting patient to be extubated today to survive but on 15/5 Tv is barely 200, patient would not survive extubation.  Had an extensive discussion with the family.  They want a trach/peg.  I expressed that I disagree with that decision and they want more time to discuss it further.  Will revisit topic in AM. - DNR  Encephalopathy due to Acute L MCA CVA with midline shift - R hemiplegia, neglect and possible aphasia - Continue Provigil - Prognosis is poor, family is discussing plan of care further - Continue TF via cortrak  Hypernatremia: Improving. - Continue current enteral free water.  - KVO IVF  Best practice:  Diet: Enteral Nutrition Pain/Anxiety/Delirium protocol (if indicated): PAD RASS goal 0 to -  1 VAP protocol (if indicated): Yes DVT prophylaxis: SCD, sq Heparin GI prophylaxis: Pantoprazole  Glucose control: Monitor CBG Mobility: bed rest Code Status: DNR Family Communication: No family bedside Disposition: ICU  Labs   CBC: Recent Labs  Lab 02/03/18 1017 02/04/18 0143 02/05/18 0513  WBC 14.2* 14.3* 12.1*  NEUTROABS 11.8*  --   --   HGB 12.2 12.4 12.0  HCT 38.7 39.7 39.9  MCV 91.9 92.8 92.8  PLT 363 381 383    Basic Metabolic  Panel: Recent Labs  Lab 02/05/18 0513 02/05/18 1116 02/06/18 0530 02/07/18 0408 02/08/18 0553  NA 153* 154* 154* 153* 150*  K 2.9* 3.5 3.9 3.8 3.8  CL 107 111 112* 111 110  CO2 33* 33* 33* 29 27  GLUCOSE 184* 184* 194* 174* 191*  BUN 61* 66* 60* 56* 49*  CREATININE 1.15* 1.18* 1.10* 1.00 0.99  CALCIUM 8.6* 8.6* 8.5* 8.4* 8.5*  MG 2.4  --   --   --   --    GFR: Estimated Creatinine Clearance: 39.5 mL/min (by C-G formula based on SCr of 0.99 mg/dL). Recent Labs  Lab 02/03/18 1017 02/04/18 0143 02/05/18 0513  WBC 14.2* 14.3* 12.1*    Liver Function Tests: No results for input(s): AST, ALT, ALKPHOS, BILITOT, PROT, ALBUMIN in the last 168 hours. No results for input(s): LIPASE, AMYLASE in the last 168 hours. No results for input(s): AMMONIA in the last 168 hours.  ABG    Component Value Date/Time   PHART 7.583 (H) 01/28/2018 0615   PCO2ART 25.8 (L) 01/28/2018 0615   PO2ART 92.3 01/28/2018 0615   HCO3 24.4 01/28/2018 0615   O2SAT 97.6 01/28/2018 0615     Coagulation Profile: No results for input(s): INR, PROTIME in the last 168 hours.  Cardiac Enzymes: No results for input(s): CKTOTAL, CKMB, CKMBINDEX, TROPONINI in the last 168 hours.  HbA1C: Hgb A1c MFr Bld  Date/Time Value Ref Range Status  01/28/2018 06:39 AM 5.5 4.8 - 5.6 % Final    Comment:    (NOTE) Pre diabetes:          5.7%-6.4% Diabetes:              >6.4% Glycemic control for   <7.0% adults with diabetes     CBG: Recent Labs  Lab 02/08/18 1540 02/08/18 1956 02/08/18 2305 02/09/18 0310 02/09/18 0748  GLUCAP 148* 139* 134* 154* 170*   The patient is critically ill with multiple organ systems failure and requires high complexity decision making for assessment and support, frequent evaluation and titration of therapies, application of advanced monitoring technologies and extensive interpretation of multiple databases.   Critical Care Time devoted to patient care services described in this  note is  33  Minutes. This time reflects time of care of this signee Dr Koren Bound. This critical care time does not reflect procedure time, or teaching time or supervisory time of PA/NP/Med student/Med Resident etc but could involve care discussion time.  Alyson Reedy, M.D. [facility] Pulmonary/Critical Care Medicine. Pager: [phone]. After hours pager: [phone].

## 2018-02-09 NOTE — Progress Notes (Signed)
RT note: upon arrival to do 1200 assessment, patient was noted to have an increased respiratory rate and decrease in tidal volume.  Placed patient back on full support ventilation and is currently tolerating well.  Will continue to monitor.

## 2018-02-10 ENCOUNTER — Inpatient Hospital Stay (HOSPITAL_COMMUNITY): Payer: Medicare Other

## 2018-02-10 LAB — POCT I-STAT 7, (LYTES, BLD GAS, ICA,H+H)
Acid-Base Excess: 8 mmol/L — ABNORMAL HIGH (ref 0.0–2.0)
Bicarbonate: 31 mmol/L — ABNORMAL HIGH (ref 20.0–28.0)
Calcium, Ion: 1.17 mmol/L (ref 1.15–1.40)
HCT: 31 % — ABNORMAL LOW (ref 36.0–46.0)
Hemoglobin: 10.5 g/dL — ABNORMAL LOW (ref 12.0–15.0)
O2 Saturation: 97 %
Patient temperature: 98.2
Potassium: 3.3 mmol/L — ABNORMAL LOW (ref 3.5–5.1)
Sodium: 144 mmol/L (ref 135–145)
TCO2: 32 mmol/L (ref 22–32)
pCO2 arterial: 37 mmHg (ref 32.0–48.0)
pH, Arterial: 7.53 — ABNORMAL HIGH (ref 7.350–7.450)
pO2, Arterial: 81 mmHg — ABNORMAL LOW (ref 83.0–108.0)

## 2018-02-10 LAB — CBC
HCT: 35.8 % — ABNORMAL LOW (ref 36.0–46.0)
Hemoglobin: 10.9 g/dL — ABNORMAL LOW (ref 12.0–15.0)
MCH: 28.3 pg (ref 26.0–34.0)
MCHC: 30.4 g/dL (ref 30.0–36.0)
MCV: 93 fL (ref 80.0–100.0)
Platelets: 329 10*3/uL (ref 150–400)
RBC: 3.85 MIL/uL — ABNORMAL LOW (ref 3.87–5.11)
RDW: 15.9 % — ABNORMAL HIGH (ref 11.5–15.5)
WBC: 15.2 10*3/uL — ABNORMAL HIGH (ref 4.0–10.5)
nRBC: 0 % (ref 0.0–0.2)

## 2018-02-10 LAB — MAGNESIUM: Magnesium: 2.5 mg/dL — ABNORMAL HIGH (ref 1.7–2.4)

## 2018-02-10 LAB — BASIC METABOLIC PANEL
Anion gap: 10 (ref 5–15)
BUN: 48 mg/dL — AB (ref 8–23)
CO2: 28 mmol/L (ref 22–32)
Calcium: 8.2 mg/dL — ABNORMAL LOW (ref 8.9–10.3)
Chloride: 108 mmol/L (ref 98–111)
Creatinine, Ser: 0.91 mg/dL (ref 0.44–1.00)
GFR calc Af Amer: >60 mL/min (ref >60)
GFR calc non Af Amer: 57 mL/min — ABNORMAL LOW (ref >60)
Glucose, Bld: 169 mg/dL — ABNORMAL HIGH (ref 70–99)
Potassium: 3.4 mmol/L — ABNORMAL LOW (ref 3.5–5.1)
Sodium: 146 mmol/L — ABNORMAL HIGH (ref 135–145)

## 2018-02-10 LAB — GLUCOSE, CAPILLARY
GLUCOSE-CAPILLARY: 137 mg/dL — AB (ref 70–99)
GLUCOSE-CAPILLARY: 136 mg/dL — AB (ref 70–99)
Glucose-Capillary: 162 mg/dL — ABNORMAL HIGH (ref 70–99)
Glucose-Capillary: 148 mg/dL — ABNORMAL HIGH (ref 70–99)
Glucose-Capillary: 125 mg/dL — ABNORMAL HIGH (ref 70–99)

## 2018-02-10 LAB — PHOSPHORUS: Phosphorus: 3.5 mg/dL (ref 2.5–4.6)

## 2018-02-10 NOTE — Progress Notes (Signed)
   Patient seen, chart reviewed.  Staffed with CCM provider, Gaylyn Lambert, NP.  Spoke to patient's daughter [name] at the bedside.  Per daughter and in conversation with CCM, family now wanting to move forward with trach and PEG.  Daughter shares she thought that that her mother would extubate and live for an extended period of time off the ventilator.  On 02/09/2018, I attempted to prepare family that patient's respiratory status may not allow her to do this; that they may need to be prepared for patient to pass away within hours to days  When I met with Naby today, I did reiterate that going forward with the trach, PEG would not mean that she still would not be ventilator dependent.  Family has consistently said that goal would be to bring their mother home and for her not to live on life support.  After much discussion Naby did eventually understand that her mother could be ventilator dependent and this in turn would lead to her living in a facility, not being cared for at home.  I also told Naby that there are only 2 facilities in our area that can care for patients with ventilator dependency .   Naby stated that "I know I am losing my mother, but are not ready yet".   I am concerned about Mrs. Sprung going forward.  Family has consistently stated she would not want to live in this debilitated state, on life support with artificial feeding.  Supported family's decisions and palliative medicine to continue to be a source of support moving forward  Thank you, Romona Curls, NP Time spent: 25 minutes Than 50% of time spent in counseling and coordination of care

## 2018-02-10 NOTE — Progress Notes (Addendum)
.   NAMEClair Vazquez, MRN:  [id number], DOB:  Jun 12, 1929, LOS: 14 ADMISSION DATE:  01/27/2018, CONSULTATION DATE:  1/26  REFERRING MD:  Mesner, CHIEF COMPLAINT:  Acute encephalopathy   Brief History   85 y/o female admitted on 1/26 with embolic left MCA stroke.  Outside window for TPA or intervention.  Past Medical History  HTN, CHF  Significant Hospital Events   Admission/intubation 1/26 Increasing mass effect and hemorrhagic conversion Palliative care consulted to clarify goals of care.  Consults:  PCCM Neurology Palliative  Procedures:  1/26 intubated>>  Significant Diagnostic Tests:  CTA 1/26: 1. Complete occlusion of the left M1 segment due to an embolus. Focal narrowing and irregularity of the mid cervical ICA on the left with diameter of 2.6 mm. This probably represents ulcerated soft plaque and this could be a source of embolus. MRI brain 1/27> large ischemic left MCA stroke, no midline shift ECHO 1/27> EF 55-65%, moderate LVH pattern CT head 1/28> evolving L MCA infarct with new 2mm midline shift CXR 1/31- bilateral infiltrates and bilateral pleural effusions CXR 2/1 - BL vascular congestion ETT 1 cm from carina   Micro Data:  1/26 MRSA> neg  Antimicrobials:  None  Interim history/subjective:  Family is opted for tracheostomy and PEG placement.  Objective   Blood pressure 108/61, pulse 84, temperature 98.5 F (36.9 C), temperature source Axillary, resp. rate (!) 22, height 5\' 3"  (1.6 m), weight 75.1 kg, SpO2 100 %.    Vent Mode: PRVC FiO2 (%):  [30 %] 30 % Set Rate:  [20 bmp] 20 bmp Vt Set:  [320 mL] 320 mL PEEP:  [5 cmH20] 5 cmH20 Plateau Pressure:  [16 cmH20-18 cmH20] 18 cmH20   Intake/Output Summary (Last 24 hours) at 02/10/2018 0910 Last data filed at 02/10/2018 0600 Gross per 24 hour  Intake 1569.92 ml  Output 1200 ml  Net 369.92 ml   Filed Weights   02/08/18 0500 02/09/18 0500 02/10/18 0500  Weight: 74.3 kg 74.8 kg 75.1 kg     Examination: General: Currently on full mechanical ventilatory support HEENT: Endotracheal tube in place Neuro: Does not follow commands CV: s1s2 rrr, no m/r/g PULM: even/non-labored, lungs bilaterally decreased in bases WU:JWJX, non-tender, bsx4 active  Extremities: warm/dry, positive edema  Skin: no rashes or lesions     Resolved Hospital Problem list    Hypotension, AKI   Assessment & Plan:   Acute respiratory failure with hypoxia requiring mechanical ventilation related to inability to protect airway. -Wean as able most likely be trached in the near future -02/10/2018 family is opted for tracheostomy and PEG.  Palliative care continues to have discussions with him. - DNR  Encephalopathy due to Acute L MCA CVA with midline shift - R hemiplegia, neglect and possible aphasia -Continue Provigil -Wants of tracheostomy and PEG tube -Continue tube feed via core track until PEG tube is applied  Hypernatremia: Recent Labs  Lab 02/08/18 0553 02/10/18 0242 02/10/18 0432  NA 150* 146* 144    Continue free water Monitor sodium  Best practice:  Diet: Enteral Nutrition Pain/Anxiety/Delirium protocol (if indicated): PAD RASS goal 0 to -1 VAP protocol (if indicated): Yes DVT prophylaxis: SCD, sq Heparin GI prophylaxis: Pantoprazole  Glucose control: Monitor CBG Mobility: bed rest Code Status: DNR Family Communication: No family bedside Disposition: ICU  Labs   CBC: Recent Labs  Lab 02/03/18 1017 02/04/18 0143 02/05/18 0513 02/10/18 0242 02/10/18 0432  WBC 14.2* 14.3* 12.1* 15.2*  --   NEUTROABS 11.8*  --   --   --   --  HGB 12.2 12.4 12.0 10.9* 10.5*  HCT 38.7 39.7 39.9 35.8* 31.0*  MCV 91.9 92.8 92.8 93.0  --   PLT 363 381 383 329  --     Basic Metabolic Panel: Recent Labs  Lab 02/05/18 0513 02/05/18 1116 02/06/18 0530 02/07/18 0408 02/08/18 0553 02/10/18 0242 02/10/18 0432  NA 153* 154* 154* 153* 150* 146* 144  K 2.9* 3.5 3.9 3.8 3.8 3.4*  3.3*  CL 107 111 112* 111 110 108  --   CO2 33* 33* 33* 29 27 28   --   GLUCOSE 184* 184* 194* 174* 191* 169*  --   BUN 61* 66* 60* 56* 49* 48*  --   CREATININE 1.15* 1.18* 1.10* 1.00 0.99 0.91  --   CALCIUM 8.6* 8.6* 8.5* 8.4* 8.5* 8.2*  --   MG 2.4  --   --   --   --  2.5*  --   PHOS  --   --   --   --   --  3.5  --    GFR: Estimated Creatinine Clearance: 43.1 mL/min (by C-G formula based on SCr of 0.91 mg/dL). Recent Labs  Lab 02/03/18 1017 02/04/18 0143 02/05/18 0513 02/10/18 0242  WBC 14.2* 14.3* 12.1* 15.2*    Liver Function Tests: No results for input(s): AST, ALT, ALKPHOS, BILITOT, PROT, ALBUMIN in the last 168 hours. No results for input(s): LIPASE, AMYLASE in the last 168 hours. No results for input(s): AMMONIA in the last 168 hours.  ABG    Component Value Date/Time   PHART 7.530 (H) 02/10/2018 0432   PCO2ART 37.0 02/10/2018 0432   PO2ART 81.0 (L) 02/10/2018 0432   HCO3 31.0 (H) 02/10/2018 0432   TCO2 32 02/10/2018 0432   O2SAT 97.0 02/10/2018 0432     Coagulation Profile: No results for input(s): INR, PROTIME in the last 168 hours.  Cardiac Enzymes: No results for input(s): CKTOTAL, CKMB, CKMBINDEX, TROPONINI in the last 168 hours.  HbA1C: Hgb A1c MFr Bld  Date/Time Value Ref Range Status  01/28/2018 06:39 AM 5.5 4.8 - 5.6 % Final    Comment:    (NOTE) Pre diabetes:          5.7%-6.4% Diabetes:              >6.4% Glycemic control for   <7.0% adults with diabetes     CBG: Recent Labs  Lab 02/09/18 1528 02/09/18 1936 02/09/18 2309 02/10/18 0311 02/10/18 0732  GLUCAP 140* 155* 139* 162* 137*   App cct 30 min  Brett CanalesSteve Minor ACNP [facility] Pager [phone] till 1 pm If no answer page [phone] 02/10/2018, 9:10 AM  Attending Note:  85 year old female with PMH above presenting with a left MCA CVA with associated respiratory failure.  Patient has not done well with weaning and I do not believe her mental status would allow for her to be  extubated.  On exam, coarse BS diffusely.  I reviewed CXR myself, ETT is in a good position.  I have had extensive discussion with the daughters about plan of care.  The daughter that is bedside today insist on trach/peg.  I was very clear with her that the patient will not improve but she is not prepared for her mother to expire at this time and insist on trach/peg.  She will be thinking about it today and tomorrow again and will likely proceed with trach on Tuesday.  PCCM will continue to manage.  The patient is  critically ill with multiple organ systems failure and requires high complexity decision making for assessment and support, frequent evaluation and titration of therapies, application of advanced monitoring technologies and extensive interpretation of multiple databases.   Critical Care Time devoted to patient care services described in this note is  37  Minutes. This time reflects time of care of this signee Dr Koren BoundWesam Yacoub. This critical care time does not reflect procedure time, or teaching time or supervisory time of PA/NP/Med student/Med Resident etc but could involve care discussion time.  Alyson ReedyWesam G. Yacoub, M.D. [facility] Pulmonary/Critical Care Medicine. Pager: [phone]. After hours pager: [phone].

## 2018-02-11 ENCOUNTER — Other Ambulatory Visit: Payer: Self-pay

## 2018-02-11 ENCOUNTER — Inpatient Hospital Stay (HOSPITAL_COMMUNITY): Payer: Medicare Other

## 2018-02-11 LAB — CBC WITH DIFFERENTIAL/PLATELET
Abs Immature Granulocytes: 0.14 10*3/uL — ABNORMAL HIGH (ref 0.00–0.07)
Basophils Absolute: 0 10*3/uL (ref 0.0–0.1)
Basophils Relative: 0 %
Eosinophils Absolute: 0.2 10*3/uL (ref 0.0–0.5)
Eosinophils Relative: 2 %
HCT: 35.5 % — ABNORMAL LOW (ref 36.0–46.0)
Hemoglobin: 10.8 g/dL — ABNORMAL LOW (ref 12.0–15.0)
Immature Granulocytes: 1 %
Lymphocytes Relative: 7 %
Lymphs Abs: 0.8 10*3/uL (ref 0.7–4.0)
MCH: 28.2 pg (ref 26.0–34.0)
MCHC: 30.4 g/dL (ref 30.0–36.0)
MCV: 92.7 fL (ref 80.0–100.0)
Monocytes Absolute: 0.7 10*3/uL (ref 0.1–1.0)
Monocytes Relative: 6 %
Neutro Abs: 10.2 10*3/uL — ABNORMAL HIGH (ref 1.7–7.7)
Neutrophils Relative %: 84 %
Platelets: 319 10*3/uL (ref 150–400)
RBC: 3.83 MIL/uL — ABNORMAL LOW (ref 3.87–5.11)
RDW: 15.9 % — ABNORMAL HIGH (ref 11.5–15.5)
WBC: 12.1 10*3/uL — ABNORMAL HIGH (ref 4.0–10.5)
nRBC: 0 % (ref 0.0–0.2)

## 2018-02-11 LAB — BLOOD GAS, ARTERIAL
Acid-Base Excess: 5.9 mmol/L — ABNORMAL HIGH (ref 0.0–2.0)
Bicarbonate: 29.1 mmol/L — ABNORMAL HIGH (ref 20.0–28.0)
DRAWN BY: 31101
FIO2: 30
MECHVT: 320 mL
O2 Saturation: 97.7 %
PEEP: 5 cmH2O
Patient temperature: 98.6
RATE: 20 resp/min
pCO2 arterial: 36.3 mmHg (ref 32.0–48.0)
pH, Arterial: 7.514 — ABNORMAL HIGH (ref 7.350–7.450)
pO2, Arterial: 95.3 mmHg (ref 83.0–108.0)

## 2018-02-11 LAB — GLUCOSE, CAPILLARY
GLUCOSE-CAPILLARY: 139 mg/dL — AB (ref 70–99)
Glucose-Capillary: 153 mg/dL — ABNORMAL HIGH (ref 70–99)
Glucose-Capillary: 149 mg/dL — ABNORMAL HIGH (ref 70–99)
Glucose-Capillary: 159 mg/dL — ABNORMAL HIGH (ref 70–99)
Glucose-Capillary: 154 mg/dL — ABNORMAL HIGH (ref 70–99)
Glucose-Capillary: 127 mg/dL — ABNORMAL HIGH (ref 70–99)
Glucose-Capillary: 138 mg/dL — ABNORMAL HIGH (ref 70–99)

## 2018-02-11 LAB — PHOSPHORUS: Phosphorus: 3.4 mg/dL (ref 2.5–4.6)

## 2018-02-11 LAB — BASIC METABOLIC PANEL
Anion gap: 7 (ref 5–15)
BUN: 47 mg/dL — AB (ref 8–23)
CO2: 28 mmol/L (ref 22–32)
Calcium: 8 mg/dL — ABNORMAL LOW (ref 8.9–10.3)
Chloride: 110 mmol/L (ref 98–111)
Creatinine, Ser: 0.89 mg/dL (ref 0.44–1.00)
GFR calc Af Amer: >60 mL/min (ref >60)
GFR calc non Af Amer: 59 mL/min — ABNORMAL LOW (ref >60)
Glucose, Bld: 162 mg/dL — ABNORMAL HIGH (ref 70–99)
Potassium: 3.4 mmol/L — ABNORMAL LOW (ref 3.5–5.1)
Sodium: 145 mmol/L (ref 135–145)

## 2018-02-11 LAB — APTT: aPTT: 37 seconds — ABNORMAL HIGH (ref 24–36)

## 2018-02-11 LAB — PROTIME-INR
INR: 1.11
Prothrombin Time: 14.2 seconds (ref 11.4–15.2)

## 2018-02-11 LAB — MAGNESIUM: Magnesium: 2.4 mg/dL (ref 1.7–2.4)

## 2018-02-11 MED ORDER — FENTANYL CITRATE (PF) 100 MCG/2ML IJ SOLN
200.0000 ug | Freq: Once | INTRAMUSCULAR | Status: AC
  Administered 2018-02-12: 100 ug via INTRAVENOUS
  Filled 2018-02-11: qty 4

## 2018-02-11 MED ORDER — MIDAZOLAM HCL 2 MG/2ML IJ SOLN
5.0000 mg | Freq: Once | INTRAMUSCULAR | Status: AC
  Administered 2018-02-12 (×2): 2 mg via INTRAVENOUS
  Filled 2018-02-11: qty 6

## 2018-02-11 MED ORDER — ETOMIDATE 2 MG/ML IV SOLN
40.0000 mg | Freq: Once | INTRAVENOUS | Status: AC
  Administered 2018-02-12 (×2): 20 mg via INTRAVENOUS
  Filled 2018-02-11: qty 20

## 2018-02-11 MED ORDER — PROPOFOL 10 MG/ML IV BOLUS: 500.0000 mg | Freq: Once | INTRAVENOUS | Status: DC

## 2018-02-11 MED ORDER — VECURONIUM BROMIDE 10 MG IV SOLR
10.0000 mg | Freq: Once | INTRAVENOUS | Status: AC
  Administered 2018-02-12: 10 mg via INTRAVENOUS
  Filled 2018-02-11: qty 10

## 2018-02-11 MED ORDER — POTASSIUM CHLORIDE 20 MEQ PO PACK
40.0000 meq | PACK | ORAL | Status: AC
  Administered 2018-02-11 (×2): 40 meq via ORAL
  Filled 2018-02-11 (×2): qty 2

## 2018-02-11 MED ORDER — FREE WATER
400.0000 mL | Freq: Two times a day (BID) | Status: DC
  Administered 2018-02-11 – 2018-02-19 (×14): 400 mL

## 2018-02-11 NOTE — Progress Notes (Signed)
Nutrition Follow-up  DOCUMENTATION CODES:   Not applicable  INTERVENTION:   Vital High Protein @ 50 ml/hr (1200 ml/day)  Provides: 1200 kcal, 105 grams protein, and 1003 ml free water.  Total free water: 2203 ml   NUTRITION DIAGNOSIS:   Inadequate oral intake related to inability to eat as evidenced by NPO status. Ongoing  GOAL:   Patient will meet greater than or equal to 90% of their needs Met.   MONITOR:   Vent status, TF tolerance  ASSESSMENT:   Pt is spanish speaking only with PMH of suspected HTN, CHF, afib admitted with left large MCA stroke with hemorrhagic conversion.    Pt discussed during ICU rounds and with RN.  Per MD family requesting trach/PEG.   Patient is currently intubated on ventilator support MV: 6 L/min Temp (24hrs), Avg:98.1 F (36.7 C), Min:97.4 F (36.3 C), Max:99.1 F (37.3 C)  Medications reviewed: colace, miralax 400 ml free water every 8 hours = 1200 ml Labs reviewed: K+ 3.4 (L) + 9.8 L with moderate edema   Diet Order:   Diet Order            Diet NPO time specified  Diet effective now              EDUCATION NEEDS:   No education needs have been identified at this time  Skin:  Skin Assessment: Reviewed RN Assessment  Last BM:  2/9 small  Height:   Ht Readings from Last 1 Encounters:  02/05/18 5' 3" (1.6 m)    Weight:   Wt Readings from Last 1 Encounters:  02/11/18 71.8 kg    Ideal Body Weight:  52.2 kg  BMI:  Body mass index is 28.04 kg/m.  Estimated Nutritional Needs:   Kcal:  1275  Protein:  85-95 grams  Fluid:  > 1.5 L/day    RD, LDN, CNSC [phone] Pager [phone] After Hours Pager  

## 2018-02-11 NOTE — Progress Notes (Signed)
.   NAMEAlix Vazquez, MRN:  [id number], DOB:  06-May-1929, LOS: 15 ADMISSION DATE:  01/27/2018, CONSULTATION DATE:  1/26  REFERRING MD:  Mesner, CHIEF COMPLAINT:  Acute encephalopathy   Brief History   85 y/o female admitted on 1/26 with embolic left MCA stroke.  Outside window for TPA or intervention.  Past Medical History  HTN, CHF  Significant Hospital Events   Admission/intubation 1/26 Increasing mass effect and hemorrhagic conversion Palliative care consulted to clarify goals of care.  Consults:  PCCM Neurology Palliative  Procedures:  1/26 intubated>>  Significant Diagnostic Tests:  CTA 1/26: 1. Complete occlusion of the left M1 segment due to an embolus. Focal narrowing and irregularity of the mid cervical ICA on the left with diameter of 2.6 mm. This probably represents ulcerated soft plaque and this could be a source of embolus. MRI brain 1/27> large ischemic left MCA stroke, no midline shift ECHO 1/27> EF 55-65%, moderate LVH pattern CT head 1/28> evolving L MCA infarct with new 44mm midline shift CXR 1/31- bilateral infiltrates and bilateral pleural effusions CXR 2/1 - BL vascular congestion ETT 1 cm from carina   Micro Data:  1/26 MRSA> neg  Antimicrobials:  None  Interim history/subjective:  Afebrile last 24 hours. Remains on minimal vent settings. At bedside is daughter this morning. Still wishes to pursue for T&P.  Objective   Blood pressure 121/67, pulse 78, temperature (!) 97.5 F (36.4 C), temperature source Axillary, resp. rate (!) 29, height 5\' 3"  (1.6 m), weight 71.8 kg, SpO2 100 %.    Vent Mode: PSV;CPAP FiO2 (%):  [30 %] 30 % Set Rate:  [20 bmp] 20 bmp Vt Set:  [320 mL] 320 mL PEEP:  [5 cmH20] 5 cmH20 Pressure Support:  [8 cmH20] 8 cmH20 Plateau Pressure:  [9 cmH20-19 cmH20] 9 cmH20   Intake/Output Summary (Last 24 hours) at 02/11/2018 1121 Last data filed at 02/11/2018 0900 Gross per 24 hour  Intake 1579.68 ml  Output 400 ml  Net 1179.68  ml   Filed Weights   02/09/18 0500 02/10/18 0500 02/11/18 0228  Weight: 74.8 kg 75.1 kg 71.8 kg   Physical Exam: General:  In bed on vent HENT: NCAT ETT in place PULM: Decreased breath sounds bilaterally, CTA B, vent supported breathing CV: RRR, no mgr GI: BS+, soft, nontender MSK: normal bulk and tone Neuro: sedated on vent, does not follow commands  Resolved Hospital Problem list    Hypotension, AKI   Assessment & Plan:   Acute respiratory failure with hypoxia requiring mechanical ventilation related to inability to protect airway.  -Family wishes to pursue tracheostomy and PEG -Scheduled for 2/11 at 11am -Wean vent settings as tolerated -SBT as tolerated -Palliative care continues to have discussion -DNR  Encephalopathy due to Acute L MCA CVA with midline shift - R hemiplegia, neglect and possible aphasia -Continue Provigil -Plan for trach and PEG -Continue tube feed via core track until PEG tube is applied -ASA, statin  Hypernatremia: Recent Labs  Lab 02/10/18 0242 02/10/18 0432 02/11/18 0429  NA 146* 144 145   Decrease free water Monitor sodium  Hypokalemia  -Replete  Best practice:  Diet: Enteral Nutrition Pain/Anxiety/Delirium protocol (if indicated): PAD RASS goal 0 to -1 VAP protocol (if indicated): Yes DVT prophylaxis: SCD, sq Heparin GI prophylaxis: Pantoprazole  Glucose control: Monitor CBG Mobility: bed rest Code Status: DNR Family Communication: Discussed plan with daughter Disposition: ICU  Labs   CBC: Recent Labs  Lab 02/05/18 0513 02/10/18 0242  02/10/18 0432 02/11/18 0429  WBC 12.1* 15.2*  --  12.1*  NEUTROABS  --   --   --  10.2*  HGB 12.0 10.9* 10.5* 10.8*  HCT 39.9 35.8* 31.0* 35.5*  MCV 92.8 93.0  --  92.7  PLT 383 329  --  319    Basic Metabolic Panel: Recent Labs  Lab 02/05/18 0513  02/06/18 0530 02/07/18 0408 02/08/18 0553 02/10/18 0242 02/10/18 0432 02/11/18 0429  NA 153*   < > 154* 153* 150* 146* 144 145    K 2.9*   < > 3.9 3.8 3.8 3.4* 3.3* 3.4*  CL 107   < > 112* 111 110 108  --  110  CO2 33*   < > 33* 29 27 28   --  28  GLUCOSE 184*   < > 194* 174* 191* 169*  --  162*  BUN 61*   < > 60* 56* 49* 48*  --  47*  CREATININE 1.15*   < > 1.10* 1.00 0.99 0.91  --  0.89  CALCIUM 8.6*   < > 8.5* 8.4* 8.5* 8.2*  --  8.0*  MG 2.4  --   --   --   --  2.5*  --  2.4  PHOS  --   --   --   --   --  3.5  --  3.4   < > = values in this interval not displayed.   GFR: Estimated Creatinine Clearance: 43.1 mL/min (by C-G formula based on SCr of 0.89 mg/dL). Recent Labs  Lab 02/05/18 0513 02/10/18 0242 02/11/18 0429  WBC 12.1* 15.2* 12.1*    Liver Function Tests: No results for input(s): AST, ALT, ALKPHOS, BILITOT, PROT, ALBUMIN in the last 168 hours. No results for input(s): LIPASE, AMYLASE in the last 168 hours. No results for input(s): AMMONIA in the last 168 hours.  ABG    Component Value Date/Time   PHART 7.514 (H) 02/11/2018 0236   PCO2ART 36.3 02/11/2018 0236   PO2ART 95.3 02/11/2018 0236   HCO3 29.1 (H) 02/11/2018 0236   TCO2 32 02/10/2018 0432   O2SAT 97.7 02/11/2018 0236     Coagulation Profile: Recent Labs  Lab 02/11/18 0429  INR 1.11    Cardiac Enzymes: No results for input(s): CKTOTAL, CKMB, CKMBINDEX, TROPONINI in the last 168 hours.  HbA1C: Hgb A1c MFr Bld  Date/Time Value Ref Range Status  01/28/2018 06:39 AM 5.5 4.8 - 5.6 % Final    Comment:    (NOTE) Pre diabetes:          5.7%-6.4% Diabetes:              >6.4% Glycemic control for   <7.0% adults with diabetes     CBG: Recent Labs  Lab 02/10/18 1535 02/10/18 1932 02/10/18 2329 02/11/18 0337 02/11/18 0745  GLUCAP 148* 125* 136* 139* 149*   The patient is critically ill with multiple organ systems failure and requires high complexity decision making for assessment and support, frequent evaluation and titration of therapies, application of advanced monitoring technologies and extensive interpretation of  multiple databases.   Critical Care Time devoted to patient care services described in this note is 32 Minutes. This time reflects time of care of this signee Dr. Mechele CollinJane Joda Braatz. This critical care time does not reflect procedure time, or teaching time or supervisory time of PA/NP/Med student/Med Resident etc but could involve care discussion time. Discussed case with PCCM, Dr. Molli KnockYacoub, for trach management.  Mechele CollinJane Antowan Samford,  M.D. [facility] Pulmonary/Critical Care Medicine Pager: [phone] After hours pager: [phone]

## 2018-02-12 ENCOUNTER — Inpatient Hospital Stay (HOSPITAL_COMMUNITY): Payer: Medicare Other

## 2018-02-12 DIAGNOSIS — J9601 Acute respiratory failure with hypoxia: Secondary | ICD-10-CM

## 2018-02-12 LAB — CBC
HCT: 44.9 % (ref 36.0–46.0)
Hemoglobin: 13.3 g/dL (ref 12.0–15.0)
MCH: 28.1 pg (ref 26.0–34.0)
MCHC: 29.6 g/dL — ABNORMAL LOW (ref 30.0–36.0)
MCV: 94.7 fL (ref 80.0–100.0)
Platelets: 410 10*3/uL — ABNORMAL HIGH (ref 150–400)
RBC: 4.74 MIL/uL (ref 3.87–5.11)
RDW: 16.4 % — AB (ref 11.5–15.5)
WBC: 20.6 10*3/uL — AB (ref 4.0–10.5)
nRBC: 0 % (ref 0.0–0.2)

## 2018-02-12 LAB — BASIC METABOLIC PANEL
Anion gap: 11 (ref 5–15)
BUN: 43 mg/dL — ABNORMAL HIGH (ref 8–23)
CO2: 26 mmol/L (ref 22–32)
Calcium: 8.3 mg/dL — ABNORMAL LOW (ref 8.9–10.3)
Chloride: 108 mmol/L (ref 98–111)
Creatinine, Ser: 0.93 mg/dL (ref 0.44–1.00)
GFR calc Af Amer: >60 mL/min (ref >60)
GFR calc non Af Amer: 56 mL/min — ABNORMAL LOW (ref >60)
Glucose, Bld: 122 mg/dL — ABNORMAL HIGH (ref 70–99)
Potassium: 4.4 mmol/L (ref 3.5–5.1)
Sodium: 145 mmol/L (ref 135–145)

## 2018-02-12 LAB — GLUCOSE, CAPILLARY
Glucose-Capillary: 109 mg/dL — ABNORMAL HIGH (ref 70–99)
Glucose-Capillary: 110 mg/dL — ABNORMAL HIGH (ref 70–99)
Glucose-Capillary: 110 mg/dL — ABNORMAL HIGH (ref 70–99)
Glucose-Capillary: 113 mg/dL — ABNORMAL HIGH (ref 70–99)
Glucose-Capillary: 114 mg/dL — ABNORMAL HIGH (ref 70–99)
Glucose-Capillary: 159 mg/dL — ABNORMAL HIGH (ref 70–99)

## 2018-02-12 MED ORDER — METOPROLOL TARTRATE 5 MG/5ML IV SOLN
5.0000 mg | Freq: Four times a day (QID) | INTRAVENOUS | Status: DC | PRN
  Administered 2018-02-12 – 2018-02-15 (×2): 5 mg via INTRAVENOUS
  Filled 2018-02-12 (×2): qty 5

## 2018-02-12 MED ORDER — ATORVASTATIN CALCIUM 10 MG PO TABS
20.0000 mg | ORAL_TABLET | Freq: Every day | ORAL | Status: DC
  Administered 2018-02-12 – 2018-02-18 (×6): 20 mg
  Filled 2018-02-12 (×5): qty 2

## 2018-02-12 MED ORDER — PROPOFOL 500 MG/50ML IV EMUL
INTRAVENOUS | Status: AC
  Filled 2018-02-12: qty 50

## 2018-02-12 NOTE — Procedures (Signed)
Bronchoscopy Procedure Note Kelli Vazquez [id number] 1929-05-14  Procedure: Bronchoscopy Indications: tracheostomy insertion  Procedure Details Consent: Risks of procedure as well as the alternatives and risks of each were explained to the (patient/caregiver).  Consent for procedure obtained. Time Out: Verified patient identification, verified procedure, site/side was marked, verified correct patient position, special equipment/implants available, medications/allergies/relevent history reviewed, required imaging and test results available.  Performed  In preparation for procedure, patient was given 100% FiO2 and bronchoscope lubricated. Sedation: Benzodiazepines and Muscle relaxants  Airway entered and the bronchus and carina were evaluated. Key portions of tracheostomy insertion were observed via bronchoscopy. Suction was provided as needed throughout procedure.  At end of procedure bronchoscope removed.    Evaluation Hemodynamic Status: BP stable throughout; O2 sats: stable throughout Patient's Current Condition: stable Specimens:  None Complications: No apparent complications Patient did tolerate procedure well.  I personally performed the procedure.  Lanier Clam 02/12/2018

## 2018-02-12 NOTE — Procedures (Signed)
Bedside Tracheostomy Insertion Procedure Note   Patient Details:   Name: Kelli Vazquez DOB: 07-13-1929 MRN: [id number]  Procedure: Tracheostomy  Pre Procedure Assessment: ET Tube Size: 7.0 ET Tube secured at lip (cm): 21 Bite block in place: No Breath Sounds: Clear  Post Procedure Assessment: BP 106/63   Pulse 86   Temp 97.9 F (36.6 C) (Axillary)   Resp 20   Ht 5\' 3"  (1.6 m)   Wt 72 kg   SpO2 100%   BMI 28.12 kg/m  O2 sats: stable throughout Complications: No apparent complications Patient did tolerate procedure well Tracheostomy Brand:Shiley Tracheostomy Style:Cuffed Tracheostomy Size: 6.0 Tracheostomy Secured EOF:[id number], velcro Tracheostomy Placement Confirmation:Trach cuff visualized and in place and Chest X ray ordered for placement    Lysbeth Penner [facility] 02/12/2018, 12:07 PM

## 2018-02-12 NOTE — Procedures (Signed)
Percutaneous Tracheostomy Placement  Consent from family.  Patient sedated, paralyzed and position.  Placed on 100% FiO2 and RR matched.  Area cleaned and draped.  Lidocaine/epi injected.  Skin incision done followed by blunt dissection.  Trachea palpated then punctured, catheter passed and visualized bronchoscopically.  Wire placed and visualized.  Catheter removed.  Airway then entered and dilated.  Size 6 cuffed shiley trach placed and visualized bronchoscopically well above carina.  Good volume returns.  Patient tolerated the procedure well without complications.  Minimal blood loss.  CXR ordered and pending.  Dr. Ellison as first assist  Wesam G. Yacoub, M.D. [facility] Pulmonary/Critical Care Medicine. Pager: [phone]. After hours pager: [phone].  

## 2018-02-12 NOTE — Progress Notes (Signed)
.   NAMEAnjeli Vazquez, MRN:  [id number], DOB:  05/03/1929, LOS: 16 ADMISSION DATE:  01/27/2018, CONSULTATION DATE:  1/26  REFERRING MD:  Mesner, CHIEF COMPLAINT:  Acute encephalopathy   Brief History   85 y/o female admitted on 1/26 with embolic left MCA stroke.  Outside window for TPA or intervention.  Past Medical History  HTN, CHF  Significant Hospital Events   Admission/intubation 1/26 Increasing mass effect and hemorrhagic conversion Palliative care consulted to clarify goals of care.  Consults:  PCCM Neurology Palliative  Procedures:  1/26 intubated>>  Significant Diagnostic Tests:  CTA 1/26: 1. Complete occlusion of the left M1 segment due to an embolus. Focal narrowing and irregularity of the mid cervical ICA on the left with diameter of 2.6 mm. This probably represents ulcerated soft plaque and this could be a source of embolus. MRI brain 1/27> large ischemic left MCA stroke, no midline shift ECHO 1/27> EF 55-65%, moderate LVH pattern CT head 1/28> evolving L MCA infarct with new 21mm midline shift CXR 1/31- bilateral infiltrates and bilateral pleural effusions CXR 2/1 - BL vascular congestion ETT 1 cm from carina   Micro Data:  1/26 MRSA> neg  Antimicrobials:  None  Interim history/subjective:  Afebrile last 24 hours. Plan for tracheostomy today.  Objective   Blood pressure 129/71, pulse 89, temperature 98.3 F (36.8 C), temperature source Axillary, resp. rate (!) 21, height 5\' 3"  (1.6 m), weight 72 kg, SpO2 99 %.    Vent Mode: PRVC FiO2 (%):  [30 %] 30 % Set Rate:  [20 bmp] 20 bmp Vt Set:  [320 mL] 320 mL PEEP:  [5 cmH20] 5 cmH20 Pressure Support:  [8 cmH20-12 cmH20] 12 cmH20 Plateau Pressure:  [12 cmH20-18 cmH20] 18 cmH20   Intake/Output Summary (Last 24 hours) at 02/12/2018 0912 Last data filed at 02/12/2018 0800 Gross per 24 hour  Intake 1004.93 ml  Output 750 ml  Net 254.93 ml   Filed Weights   02/10/18 0500 02/11/18 0228 02/12/18 0500    Weight: 75.1 kg 71.8 kg 72 kg   Physical Exam: General: Sedated and intubated, laying in bed HENT: [place], AT, ETT in place Eyes: EOMI, no scleral icterus Respiratory: Mild rhonchi bilaterally.  No crackles, wheezing or rales Cardiovascular: RRR, -M/R/G, no JVD GI: BS+, soft, nontender Extremities:-Edema,-tenderness Neuro: Sedated, unable to follow commands Skin: Intact, no rashes or bruising Psych: Unable to assess GU: Foley in place  Resolved Hospital Problem list    Hypotension, AKI   Assessment & Plan:   Acute respiratory failure with hypoxia requiring mechanical ventilation related to inability to protect airway.  -Family want to pursue tracheostomy and PEG -Scheduled for 2/11 at 11am -Trach orders placed -Palliative care -DNR  Encephalopathy due to Acute L MCA CVA with midline shift - R hemiplegia, neglect and possible aphasia -Continue Provigil -Plan for trach and PEG -Continue tube feed via core track until PEG tube is applied -ASA, statin  Hypernatremia: Recent Labs  Lab 02/10/18 0432 02/11/18 0429 02/12/18 0541  NA 144 145 145   Continue free water Monitor sodium  Hypokalemia  -BMP daily -No repletion indicated today  Best practice:  Diet: Enteral Nutrition Pain/Anxiety/Delirium protocol (if indicated): PAD RASS goal 0 to -1 VAP protocol (if indicated): Yes DVT prophylaxis: SCD, sq Heparin GI prophylaxis: Pantoprazole  Glucose control: Monitor CBG Mobility: bed rest Code Status: DNR Family Communication: Daughter at bedside Disposition: Remain in ICU  Labs   CBC: Recent Labs  Lab 02/10/18 0242 02/10/18  9381 02/11/18 0429  WBC 15.2*  --  12.1*  NEUTROABS  --   --  10.2*  HGB 10.9* 10.5* 10.8*  HCT 35.8* 31.0* 35.5*  MCV 93.0  --  92.7  PLT 329  --  319    Basic Metabolic Panel: Recent Labs  Lab 02/07/18 0408 02/08/18 0553 02/10/18 0242 02/10/18 0432 02/11/18 0429 02/12/18 0541  NA 153* 150* 146* 144 145 145  K 3.8 3.8 3.4*  3.3* 3.4* 4.4  CL 111 110 108  --  110 108  CO2 29 27 28   --  28 26  GLUCOSE 174* 191* 169*  --  162* 122*  BUN 56* 49* 48*  --  47* 43*  CREATININE 1.00 0.99 0.91  --  0.89 0.93  CALCIUM 8.4* 8.5* 8.2*  --  8.0* 8.3*  MG  --   --  2.5*  --  2.4  --   PHOS  --   --  3.5  --  3.4  --    GFR: Estimated Creatinine Clearance: 41.3 mL/min (by C-G formula based on SCr of 0.93 mg/dL). Recent Labs  Lab 02/10/18 0242 02/11/18 0429  WBC 15.2* 12.1*    Liver Function Tests: No results for input(s): AST, ALT, ALKPHOS, BILITOT, PROT, ALBUMIN in the last 168 hours. No results for input(s): LIPASE, AMYLASE in the last 168 hours. No results for input(s): AMMONIA in the last 168 hours.  ABG    Component Value Date/Time   PHART 7.514 (H) 02/11/2018 0236   PCO2ART 36.3 02/11/2018 0236   PO2ART 95.3 02/11/2018 0236   HCO3 29.1 (H) 02/11/2018 0236   TCO2 32 02/10/2018 0432   O2SAT 97.7 02/11/2018 0236     Coagulation Profile: Recent Labs  Lab 02/11/18 0429  INR 1.11    Cardiac Enzymes: No results for input(s): CKTOTAL, CKMB, CKMBINDEX, TROPONINI in the last 168 hours.  HbA1C: Hgb A1c MFr Bld  Date/Time Value Ref Range Status  01/28/2018 06:39 AM 5.5 4.8 - 5.6 % Final    Comment:    (NOTE) Pre diabetes:          5.7%-6.4% Diabetes:              >6.4% Glycemic control for   <7.0% adults with diabetes     CBG: Recent Labs  Lab 02/11/18 1524 02/11/18 1942 02/11/18 2322 02/12/18 0322 02/12/18 0751  GLUCAP 154* 127* 138* 109* 110*   The patient is critically ill with multiple organ systems failure and requires high complexity decision making for assessment and support, frequent evaluation and titration of therapies, application of advanced monitoring technologies and extensive interpretation of multiple databases.   Critical Care Time devoted to patient care services described in this note is 32 Minutes. This time reflects time of care of this signee Dr. Mechele Collin. This  critical care time does not reflect procedure time, or teaching time or supervisory time of PA/NP/Med student/Med Resident etc but could involve care discussion time.  Mechele Collin, M.D. [facility] Pulmonary/Critical Care Medicine Pager: [phone] After hours pager: [phone]

## 2018-02-12 NOTE — Progress Notes (Signed)
eLink Physician-Brief Progress Note Patient Name: Kelli Vazquez DOB: March 17, 1929 MRN: [id number]   Date of Service  02/12/2018  HPI/Events of Note  Notified of HR 120s to as high as 140s despite giving Fentanyl. Chronic Afib. BP 172/91 No significant electrolyte abnormality Patient s/p perc trach CXR without pneumothorax  eICU Interventions  Ordered metoprolol 5 mg PRN for HR > 130 Consider resuming diltiazem CBC ordered     Intervention Category Major Interventions: Arrhythmia - evaluation and management  Rosalie Gums Salil Raineri 02/12/2018, 8:04 PM

## 2018-02-13 ENCOUNTER — Inpatient Hospital Stay (HOSPITAL_COMMUNITY): Payer: Medicare Other

## 2018-02-13 DIAGNOSIS — Z930 Tracheostomy status: Secondary | ICD-10-CM

## 2018-02-13 DIAGNOSIS — G9340 Encephalopathy, unspecified: Secondary | ICD-10-CM

## 2018-02-13 LAB — GLUCOSE, CAPILLARY
Glucose-Capillary: 194 mg/dL — ABNORMAL HIGH (ref 70–99)
Glucose-Capillary: 168 mg/dL — ABNORMAL HIGH (ref 70–99)
Glucose-Capillary: 155 mg/dL — ABNORMAL HIGH (ref 70–99)
Glucose-Capillary: 146 mg/dL — ABNORMAL HIGH (ref 70–99)
Glucose-Capillary: 146 mg/dL — ABNORMAL HIGH (ref 70–99)
Glucose-Capillary: 147 mg/dL — ABNORMAL HIGH (ref 70–99)

## 2018-02-13 LAB — BASIC METABOLIC PANEL
Anion gap: 11 (ref 5–15)
BUN: 47 mg/dL — ABNORMAL HIGH (ref 8–23)
CALCIUM: 8.3 mg/dL — AB (ref 8.9–10.3)
CO2: 25 mmol/L (ref 22–32)
Chloride: 106 mmol/L (ref 98–111)
Creatinine, Ser: 1.02 mg/dL — ABNORMAL HIGH (ref 0.44–1.00)
GFR calc Af Amer: 58 mL/min — ABNORMAL LOW (ref >60)
GFR calc non Af Amer: 50 mL/min — ABNORMAL LOW (ref >60)
GLUCOSE: 197 mg/dL — AB (ref 70–99)
Potassium: 4.1 mmol/L (ref 3.5–5.1)
Sodium: 142 mmol/L (ref 135–145)

## 2018-02-13 NOTE — Progress Notes (Signed)
Daily Progress Note   Patient Name: Kelli Vazquez       Date: 02/13/2018 DOB: Nov 20, 1929  Age: 85 y.o. MRN#: [id number] Attending Physician: Margaretha Seeds, MD Primary Care Physician: No primary care provider on file. Admit Date: 01/27/2018  Reason for Consultation/Follow-up: Establishing goals of care  Subjective: Patient opens eyes to stimuli but does not follow commands. Language barrier--spanish speaking only. Trach placed yesterday. Plan is for PEG tube placement soon. No s/s of distress or discomfort.  No family at bedside.    Assessment: Acute Respiratory failure with hypoxia requiring mechanical ventilation Acute Left MCA CVA with midline shift Right hemiplegia Atrial Fibrillation Diastolic Heart Failure   Patient Profile/HPI: 85 y.o.femalewith past medical history of hypertension, congestive heart failure, atrial fib, history of CVAadmitted on 1/26/2020with cute MCA infarct. Patient lives alone, and was found down on her floor the next morning. She opens her eyes to voice and stimulation but is inconsistently following commands. Consult ordered for goals of care in the setting of CVA with respiratory failure.    Length of Stay: 17  Current Medications: Scheduled Meds:  . aspirin  81 mg Per Tube Daily  . atorvastatin  20 mg Per Tube q1800  . chlorhexidine gluconate (MEDLINE KIT)  15 mL Mouth Rinse BID  . docusate  100 mg Per Tube BID  . feeding supplement (VITAL HIGH PROTEIN)  1,000 mL Per Tube Q24H  . free water  400 mL Per Tube q12n4p  . heparin injection (subcutaneous)  5,000 Units Subcutaneous Q8H  . mouth rinse  15 mL Mouth Rinse 10 times per day  . modafinil  100 mg Oral Daily  . pantoprazole sodium  40 mg Per Tube Daily  . polyethylene glycol  17 g Per  Tube BID  . propofol  500 mg Intravenous Once    Continuous Infusions: . sodium chloride 10 mL/hr at 02/13/18 0800    PRN Meds: acetaminophen **OR** acetaminophen (TYLENOL) oral liquid 160 mg/5 mL **OR** acetaminophen, bisacodyl, fentaNYL (SUBLIMAZE) injection, metoprolol tartrate  Physical Exam Vitals signs and nursing note reviewed.  Constitutional:      Appearance: She is ill-appearing.  Cardiovascular:     Rate and Rhythm: Normal rate.  Pulmonary:     Effort: No tachypnea, accessory muscle usage or respiratory distress.     Comments: Trach/vent  Abdominal:     Tenderness: There is no abdominal tenderness.  Skin:    General: Skin is warm and dry.  Neurological:     Mental Status: She is easily aroused.     Comments: Opens eyes to stimuli. Does not follow commands. Spanish speaking only       Vital Signs: BP (!) 115/57 (BP Location: Right Arm)   Pulse 96   Temp 98.4 F (36.9 C)   Resp (!) 31   Ht _0  (1.6 m)   Wt 76.3 kg   SpO2 98%   BMI 29.80 kg/m  SpO2: SpO2: 98 % O2 Device: O2 Device: Ventilator O2 Flow Rate:    Intake/output summary:   Intake/Output Summary (Last 24 hours) at 02/13/2018 1228 Last data filed at 02/13/2018 0800 Gross per 24 hour  Intake 749.91 ml  Output 550 ml  Net 199.91 ml   LBM: Last BM Date: 02/11/18 Baseline Weight: Weight: 68 kg Most recent weight: Weight: 76.3 kg       Palliative Assessment/Data: 20%    Flowsheet Rows     Most Recent Value  Intake Tab  Referral Department  Critical care  Unit at Time of Referral  ICU  Palliative Care Primary Diagnosis  Neurology  Date Notified  01/31/18  Palliative Care Type  New Palliative care  Reason for referral  Clarify Goals of Care  Date of Admission  01/27/18  Date first seen by Palliative Care  02/01/18  # of days Palliative referral response time  1 Day(s)  # of days IP prior to Palliative referral  4  Clinical Assessment  Palliative Performance Scale Score  20%  Pain  Max last 24 hours  Not able to report  Pain Min Last 24 hours  Not able to report  Dyspnea Max Last 24 Hours  Not able to report  Dyspnea Min Last 24 hours  Not able to report  Nausea Max Last 24 Hours  Not able to report  Nausea Min Last 24 Hours  Not able to report  Anxiety Max Last 24 Hours  Not able to report  Anxiety Min Last 24 Hours  Not able to report  Other Max Last 24 Hours  Not able to report  Psychosocial & Spiritual Assessment  Palliative Care Outcomes  Patient/Family meeting held?  Yes  Who was at the meeting?  dtr [place]  ACP counseling assistance, Provided psychosocial or spiritual support, Provided end of life care assistance, Clarified goals of care  Patient/Family wishes: Interventions discontinued/not started   [place] follow-up planned  Yes, Facility      Patient Active Problem List   Diagnosis Date Noted  . Acute respiratory failure with hypoxemia ([place])   . Goals of care, counseling/discussion   . Palliative care by specialist   . Acute ischemic left MCA stroke ([place]) 01/27/2018  . Respiratory failure [facility])     Palliative Care Plan    Recommendations/Plan:  Daughters have now changed plan of care and requesting trach/peg and ongoing aggressive medical interventions. Trach placed 02/12/18. Pending PEG placement. Will need vent SNF. May benefit from ongoing palliative services at SNF.   Goals of Care and Additional Recommendations:  Limitations on Scope of Treatment: Full Scope Treatment  Code Status:  DNR  Prognosis:   Unable to determine, poor prognosis given acute left CVA with acute respiratory failure requiring prolonged mechanical ventilation  Discharge Planning:  To Be Determined  Care plan was discussed: No  family at bedside  Thank you for allowing the Palliative Medicine Team to assist in the care of this patient.  Total time spent:  15 min     Greater than 50%  of this time was spent counseling  and coordinating care related to the above assessment and plan.  Ihor Dow, DNP FNP-C Palliative Medicine Team  Phone: [phone] Fax: [phone]  Please contact Palliative MedicineTeam phone at [phone] for questions and concerns between 7 am - 7 pm.   Please see AMION for individual provider pager numbers.

## 2018-02-13 NOTE — Progress Notes (Signed)
.   NAMEKaija Vazquez, MRN:  [id number], DOB:  1929/11/17, LOS: 17 ADMISSION DATE:  01/27/2018, CONSULTATION DATE:  1/26  REFERRING MD:  Mesner, CHIEF COMPLAINT:  Acute encephalopathy   Brief History   85 y/o female admitted on 1/26 with embolic left MCA stroke.  Outside window for TPA or intervention.  Past Medical History  HTN, CHF  Significant Hospital Events   Admission/intubation 1/26 Increasing mass effect and hemorrhagic conversion Palliative care consulted to clarify goals of care.  Consults:  PCCM Neurology Palliative  Procedures:  1/26 intubated>>1/11 1/11 trach (JY)>>>  Significant Diagnostic Tests:  CTA 1/26: 1. Complete occlusion of the left M1 segment due to an embolus. Focal narrowing and irregularity of the mid cervical ICA on the left with diameter of 2.6 mm. This probably represents ulcerated soft plaque and this could be a source of embolus. MRI brain 1/27> large ischemic left MCA stroke, no midline shift ECHO 1/27> EF 55-65%, moderate LVH pattern CT head 1/28> evolving L MCA infarct with new 58mm midline shift CXR 1/31- bilateral infiltrates and bilateral pleural effusions CXR 2/1 - BL vascular congestion ETT 1 cm from carina   Micro Data:  1/26 MRSA> neg  Antimicrobials:  None  Interim history/subjective:  Trach placed yesterday, tolerating hight PS weaning  Objective   Blood pressure (!) 115/57, pulse 96, temperature 98.4 F (36.9 C), resp. rate (!) 31, height 5\' 3"  (1.6 m), weight 76.3 kg, SpO2 98 %.    Vent Mode: PRVC FiO2 (%):  [30 %-40 %] 30 % Set Rate:  [20 bmp] 20 bmp Vt Set:  [320 mL] 320 mL PEEP:  [5 cmH20] 5 cmH20 Plateau Pressure:  [17 cmH20-21 cmH20] 17 cmH20   Intake/Output Summary (Last 24 hours) at 02/13/2018 1143 Last data filed at 02/13/2018 0800 Gross per 24 hour  Intake 769.81 ml  Output 600 ml  Net 169.81 ml   Filed Weights   02/11/18 0228 02/12/18 0500 02/13/18 0455  Weight: 71.8 kg 72 kg 76.3 kg   Physical  Exam: General: Alert and interactive, paralyzed on the right HENT: [place]/AT, PERRL, EOM-I and MMM Eyes: EOM-I Respiratory: Coarse BS diffusely Cardiovascular: RRR, Nl S1/S2 and -M/R/G GI: Soft, NT, ND and +BS Extremities: -edema and tenderness Neuro: Awake and following commands Skin: Intact, no rashes or bruising Psych: Unable to assess GU: Foley in place  Resolved Hospital Problem list    Hypotension, AKI   Assessment & Plan:   Acute respiratory failure with hypoxia requiring mechanical ventilation related to inability to protect airway.  - IR G-tube to be placed - Hold further weaning today, failed - Trach care - DNR  Encephalopathy due to Acute L MCA CVA with midline shift - R hemiplegia, neglect and possible aphasia - Continue Provigil - G-tube as above - Continue tube feed via core track until PEG tube is applied - ASA, statin  Hypernatremia: Recent Labs  Lab 02/11/18 0429 02/12/18 0541 02/13/18 0450  NA 145 145 142   - Continue free water - Monitor sodium  Hypokalemia  - BMP daily - Replace electrolytes as indicated  Daughter informed that we are calling IR to place G-tube and social work consult placed  Best practice:  Diet: Enteral Nutrition Pain/Anxiety/Delirium protocol (if indicated): PAD RASS goal 0 to -1 VAP protocol (if indicated): Yes DVT prophylaxis: SCD, sq Heparin GI prophylaxis: Pantoprazole  Glucose control: Monitor CBG Mobility: bed rest Code Status: DNR Family Communication: Daughter at bedside Disposition: Remain in ICU  Labs  CBC: Recent Labs  Lab 02/10/18 0242 02/10/18 0432 02/11/18 0429 02/12/18 2009  WBC 15.2*  --  12.1* 20.6*  NEUTROABS  --   --  10.2*  --   HGB 10.9* 10.5* 10.8* 13.3  HCT 35.8* 31.0* 35.5* 44.9  MCV 93.0  --  92.7 94.7  PLT 329  --  319 410*    Basic Metabolic Panel: Recent Labs  Lab 02/08/18 0553 02/10/18 0242 02/10/18 0432 02/11/18 0429 02/12/18 0541 02/13/18 0450  NA 150* 146* 144 145  145 142  K 3.8 3.4* 3.3* 3.4* 4.4 4.1  CL 110 108  --  110 108 106  CO2 27 28  --  28 26 25   GLUCOSE 191* 169*  --  162* 122* 197*  BUN 49* 48*  --  47* 43* 47*  CREATININE 0.99 0.91  --  0.89 0.93 1.02*  CALCIUM 8.5* 8.2*  --  8.0* 8.3* 8.3*  MG  --  2.5*  --  2.4  --   --   PHOS  --  3.5  --  3.4  --   --    GFR: Estimated Creatinine Clearance: 38.8 mL/min (A) (by C-G formula based on SCr of 1.02 mg/dL (H)). Recent Labs  Lab 02/10/18 0242 02/11/18 0429 02/12/18 2009  WBC 15.2* 12.1* 20.6*    Liver Function Tests: No results for input(s): AST, ALT, ALKPHOS, BILITOT, PROT, ALBUMIN in the last 168 hours. No results for input(s): LIPASE, AMYLASE in the last 168 hours. No results for input(s): AMMONIA in the last 168 hours.  ABG    Component Value Date/Time   PHART 7.514 (H) 02/11/2018 0236   PCO2ART 36.3 02/11/2018 0236   PO2ART 95.3 02/11/2018 0236   HCO3 29.1 (H) 02/11/2018 0236   TCO2 32 02/10/2018 0432   O2SAT 97.7 02/11/2018 0236     Coagulation Profile: Recent Labs  Lab 02/11/18 0429  INR 1.11    Cardiac Enzymes: No results for input(s): CKTOTAL, CKMB, CKMBINDEX, TROPONINI in the last 168 hours.  HbA1C: Hgb A1c MFr Bld  Date/Time Value Ref Range Status  01/28/2018 06:39 AM 5.5 4.8 - 5.6 % Final    Comment:    (NOTE) Pre diabetes:          5.7%-6.4% Diabetes:              >6.4% Glycemic control for   <7.0% adults with diabetes     CBG: Recent Labs  Lab 02/12/18 1935 02/12/18 2342 02/13/18 0336 02/13/18 0742 02/13/18 1130  GLUCAP 114* 159* 194* 168* 155*   The patient is critically ill with multiple organ systems failure and requires high complexity decision making for assessment and support, frequent evaluation and titration of therapies, application of advanced monitoring technologies and extensive interpretation of multiple databases.   Critical Care Time devoted to patient care services described in this note is  31  Minutes. This time  reflects time of care of this signee Dr Koren Bound. This critical care time does not reflect procedure time, or teaching time or supervisory time of PA/NP/Med student/Med Resident etc but could involve care discussion time.  Alyson Reedy, M.D. [facility] Pulmonary/Critical Care Medicine. Pager: [phone]. After hours pager: [phone].

## 2018-02-13 NOTE — Progress Notes (Signed)
PT Cancellation Note  Patient Details Name: Landis Beamon MRN: [id number] DOB: November 16, 1929   Cancelled Treatment:    Reason Eval/Treat Not Completed: Patient not medically ready. Pt now with trach and peg. Unsure of medical plan with patient as pt is DNR. Appears to have conflicting opinions on medical plan of care. Family reports that patient wouldn't want to live on life support however they moved forward with trach and peg. Appears pt will no have progress functionally with PT as pt with significant impairment and will require trach and peg to maintain life. PT to await official advancement of medical plan before completing PT Eval is medically appropriate.  Lewis Shock, PT, DPT Acute Rehabilitation Services Pager #: [phone] Office #: [phone]    Iona Hansen 02/13/2018, 8:18 AM

## 2018-02-13 NOTE — Progress Notes (Signed)
RT transported pt to and from 4N29 tp CT1 on vent. Pt stable throughout with no complications. VS within normal limits. RT will continue to monitor

## 2018-02-13 NOTE — Progress Notes (Signed)
CM referral for [facility].  Met with pt's daughter, [name] to discuss LTAC at bedside.  She understands that pt will likely have lengthy weaning period from ventilator.   Daughter is open to pt discharging to [facility]; she prefers [facility].  Referral to [facility] admissions liaison, Berneice Gandy, who will initiate insurance as appropriate.    Reinaldo Raddle, RN, BSN  Trauma/Neuro ICU Case Manager [phone]

## 2018-02-13 NOTE — Progress Notes (Signed)
SLP Cancellation Note  Patient Details Name: Donna Leatherwood MRN: [id number] DOB: 12-16-1929   Cancelled treatment:       Reason Eval/Treat Not Completed: Medical issues which prohibited therapy.  On vent. Will follow.    Royce Macadamia 02/13/2018, 8:22 AM  Breck Coons Lonell Face.Ed Nurse, children's [phone] Office [phone]

## 2018-02-14 DIAGNOSIS — Z930 Tracheostomy status: Secondary | ICD-10-CM

## 2018-02-14 LAB — BASIC METABOLIC PANEL
Anion gap: 8 (ref 5–15)
BUN: 42 mg/dL — AB (ref 8–23)
CO2: 26 mmol/L (ref 22–32)
CREATININE: 0.88 mg/dL (ref 0.44–1.00)
Calcium: 8.1 mg/dL — ABNORMAL LOW (ref 8.9–10.3)
Chloride: 110 mmol/L (ref 98–111)
GFR calc Af Amer: >60 mL/min (ref >60)
GFR calc non Af Amer: 59 mL/min — ABNORMAL LOW (ref >60)
Glucose, Bld: 130 mg/dL — ABNORMAL HIGH (ref 70–99)
POTASSIUM: 3.9 mmol/L (ref 3.5–5.1)
Sodium: 144 mmol/L (ref 135–145)

## 2018-02-14 LAB — GLUCOSE, CAPILLARY
Glucose-Capillary: 119 mg/dL — ABNORMAL HIGH (ref 70–99)
Glucose-Capillary: 116 mg/dL — ABNORMAL HIGH (ref 70–99)
Glucose-Capillary: 117 mg/dL — ABNORMAL HIGH (ref 70–99)
Glucose-Capillary: 104 mg/dL — ABNORMAL HIGH (ref 70–99)
Glucose-Capillary: 121 mg/dL — ABNORMAL HIGH (ref 70–99)
Glucose-Capillary: 135 mg/dL — ABNORMAL HIGH (ref 70–99)

## 2018-02-14 LAB — CBC
HEMATOCRIT: 32.8 % — AB (ref 36.0–46.0)
HEMOGLOBIN: 10.4 g/dL — AB (ref 12.0–15.0)
MCH: 29.4 pg (ref 26.0–34.0)
MCHC: 31.7 g/dL (ref 30.0–36.0)
MCV: 92.7 fL (ref 80.0–100.0)
Platelets: 323 10*3/uL (ref 150–400)
RBC: 3.54 MIL/uL — ABNORMAL LOW (ref 3.87–5.11)
RDW: 16.4 % — AB (ref 11.5–15.5)
WBC: 16.9 10*3/uL — ABNORMAL HIGH (ref 4.0–10.5)
nRBC: 0 % (ref 0.0–0.2)

## 2018-02-14 LAB — PHOSPHORUS: Phosphorus: 3.8 mg/dL (ref 2.5–4.6)

## 2018-02-14 LAB — MAGNESIUM: Magnesium: 2.4 mg/dL (ref 1.7–2.4)

## 2018-02-14 MED ORDER — FUROSEMIDE 10 MG/ML IJ SOLN
40.0000 mg | Freq: Once | INTRAMUSCULAR | Status: AC
  Administered 2018-02-14: 40 mg via INTRAVENOUS
  Filled 2018-02-14: qty 4

## 2018-02-14 NOTE — Progress Notes (Signed)
Request for gastrostomy tube placement received in IR - patient has been approved for g-tube placement based on anatomy. IR likely unable to place until early next week - will see for full consult/consent Friday 2/14.  Please call IR with questions or concerns.   Lynnette Caffey, PA-C

## 2018-02-14 NOTE — Progress Notes (Signed)
.   NAMEAylani Vazquez, MRN:  [id number], DOB:  Jul 12, 1929, LOS: 18 ADMISSION DATE:  01/27/2018, CONSULTATION DATE:  1/26  REFERRING MD:  Mesner, CHIEF COMPLAINT:  Acute encephalopathy   Brief History   85 y/o female admitted on 1/26 with embolic left MCA stroke.  Outside window for TPA or intervention.  Past Medical History  HTN, CHF  Significant Hospital Events   Admission/intubation 1/26 Increasing mass effect and hemorrhagic conversion Palliative care consulted to clarify goals of care.  Consults:  PCCM Neurology Palliative  Procedures:  1/26 intubated>>2/11 2/11 trach (JY)>>> 02/14/2018 plan for interventional radiology placed PEG tube>> Significant Diagnostic Tests:  CTA 1/26: 1. Complete occlusion of the left M1 segment due to an embolus. Focal narrowing and irregularity of the mid cervical ICA on the left with diameter of 2.6 mm. This probably represents ulcerated soft plaque and this could be a source of embolus. MRI brain 1/27> large ischemic left MCA stroke, no midline shift ECHO 1/27> EF 55-65%, moderate LVH pattern CT head 1/28> evolving L MCA infarct with new 83mm midline shift CXR 1/31- bilateral infiltrates and bilateral pleural effusions CXR 2/1 - BL vascular congestion ETT 1 cm from carina   Micro Data:  1/26 MRSA> neg  Antimicrobials:  None  Interim history/subjective:  Trach placed yesterday, tolerating hight PS weaning  Objective   Blood pressure 123/66, pulse (!) 105, temperature 98.5 F (36.9 C), temperature source Oral, resp. rate 20, height 5\' 3"  (1.6 m), weight 76.3 kg, SpO2 97 %.    Vent Mode: PRVC FiO2 (%):  [30 %] 30 % Set Rate:  [20 bmp] 20 bmp Vt Set:  [320 mL] 320 mL PEEP:  [5 cmH20] 5 cmH20 Pressure Support:  [12 cmH20] 12 cmH20 Plateau Pressure:  [12 cmH20-28 cmH20] 17 cmH20   Intake/Output Summary (Last 24 hours) at 02/14/2018 1106 Last data filed at 02/14/2018 0900 Gross per 24 hour  Intake 1303.62 ml  Output 1050 ml  Net  253.62 ml   Filed Weights   02/11/18 0228 02/12/18 0500 02/13/18 0455  Weight: 71.8 kg 72 kg 76.3 kg   Physical Exam: General: Arouses to voice  HEENT: Tracheostomy in place is unremarkable Neuro: Opens eyes intermittently follows commands  CV: Sounds are distant PULM: even/non-labored, lungs bilaterally decreased in bases GY:FVCB, non-tender, bsx4 active  Extremities: warm/dry, 1+ edema, space boots in place Skin: no rashes or lesions   Resolved Hospital Problem list    Hypotension, AKI   Assessment & Plan:   Acute respiratory failure with hypoxia requiring mechanical ventilation related to inability to protect airway.  Await G-tube tube placed by interventional radiology No further weaning 02/14/2018 as she is due for [facility] procedure Standard trach care Currently is a DNR  Encephalopathy due to Acute L MCA CVA with midline shift - R hemiplegia, neglect and possible aphasia We will continue Provigil And for G-tube Continue tube feedings via core tract until PEG tube is available ASA and statin  Hypernatremia: Recent Labs  Lab 02/12/18 0541 02/13/18 0450 02/14/18 0357  NA 145 142 144   Continue free water Note sodium is normalized  Hypokalemia  Recent Labs  Lab 02/12/18 0541 02/13/18 0450 02/14/18 0357  K 4.4 4.1 3.9   Monitor electrolytes and replete as needed   Family aware that IR is to place a G-tube on 02/14/2018 and Child psychotherapist is finding Science writer:  Diet: Enteral Nutrition Pain/Anxiety/Delirium protocol (if indicated): PAD RASS goal 0 to -1 VAP protocol (  if indicated): Yes DVT prophylaxis: SCD, sq Heparin GI prophylaxis: Pantoprazole  Glucose control: Monitor CBG Mobility: bed rest Code Status: DNR Family Communication: 02/14/2018 updated daughter at bedside Disposition: Remain in ICU  Labs   CBC: Recent Labs  Lab 02/10/18 0242 02/10/18 0432 02/11/18 0429 02/12/18 2009 02/14/18 0357  WBC 15.2*  --  12.1* 20.6* 16.9*    NEUTROABS  --   --  10.2*  --   --   HGB 10.9* 10.5* 10.8* 13.3 10.4*  HCT 35.8* 31.0* 35.5* 44.9 32.8*  MCV 93.0  --  92.7 94.7 92.7  PLT 329  --  319 410* 323    Basic Metabolic Panel: Recent Labs  Lab 02/10/18 0242 02/10/18 0432 02/11/18 0429 02/12/18 0541 02/13/18 0450 02/14/18 0357  NA 146* 144 145 145 142 144  K 3.4* 3.3* 3.4* 4.4 4.1 3.9  CL 108  --  110 108 106 110  CO2 28  --  28 26 25 26   GLUCOSE 169*  --  162* 122* 197* 130*  BUN 48*  --  47* 43* 47* 42*  CREATININE 0.91  --  0.89 0.93 1.02* 0.88  CALCIUM 8.2*  --  8.0* 8.3* 8.3* 8.1*  MG 2.5*  --  2.4  --   --  2.4  PHOS 3.5  --  3.4  --   --  3.8   GFR: Estimated Creatinine Clearance: 44.9 mL/min (by C-G formula based on SCr of 0.88 mg/dL). Recent Labs  Lab 02/10/18 0242 02/11/18 0429 02/12/18 2009 02/14/18 0357  WBC 15.2* 12.1* 20.6* 16.9*    Liver Function Tests: No results for input(s): AST, ALT, ALKPHOS, BILITOT, PROT, ALBUMIN in the last 168 hours. No results for input(s): LIPASE, AMYLASE in the last 168 hours. No results for input(s): AMMONIA in the last 168 hours.  ABG    Component Value Date/Time   PHART 7.514 (H) 02/11/2018 0236   PCO2ART 36.3 02/11/2018 0236   PO2ART 95.3 02/11/2018 0236   HCO3 29.1 (H) 02/11/2018 0236   TCO2 32 02/10/2018 0432   O2SAT 97.7 02/11/2018 0236     Coagulation Profile: Recent Labs  Lab 02/11/18 0429  INR 1.11    Cardiac Enzymes: No results for input(s): CKTOTAL, CKMB, CKMBINDEX, TROPONINI in the last 168 hours.  HbA1C: Hgb A1c MFr Bld  Date/Time Value Ref Range Status  01/28/2018 06:39 AM 5.5 4.8 - 5.6 % Final    Comment:    (NOTE) Pre diabetes:          5.7%-6.4% Diabetes:              >6.4% Glycemic control for   <7.0% adults with diabetes     CBG: Recent Labs  Lab 02/13/18 1508 02/13/18 1943 02/13/18 2318 02/14/18 0330 02/14/18 0749  GLUCAP 146* 146* 147* 119* 116*   Steve Kelli Vazquez ACNP [facility] PCCM Pager [phone] till 1  pm If no answer page [phone] 02/14/2018, 11:07 AM

## 2018-02-14 NOTE — Progress Notes (Signed)
PT Cancellation Note  Patient Details Name: Kelli Vazquez MRN: [id number] DOB: [birth year]   Cancelled Treatment:    Reason Eval/Treat Not Completed: Medical issues which prohibited therapy(Pt on bedrest per order 1/26.  Please update activity level if PT is desired. )   Berline Lopes 02/14/2018, 9:14 AM  Nisha Dhami,PT Acute Rehabilitation Services Pager:  [phone]  Office:  [phone]

## 2018-02-14 NOTE — Evaluation (Addendum)
Physical Therapy Evaluation Patient Details Name: Kelli Vazquez MRN: [id number] DOB: [birth year] Today's Date: 02/14/2018   History of Present Illness  85 y/o female admitted on 1/26 with embolic left MCA stroke.  Outside window for TPA or intervention.  VDRF 1/26-present.  past medical history of hypertension, congestive heart failure, atrial fib, history of CVA   Clinical Impression  Pt admitted with above diagnosis. Pt currently with functional limitations due to the deficits listed below (see PT Problem List). Pt was able to tolerate UE and LE exercises to left hemibody.  Pt with slight movement of right shoulder with AA/ROM.  Pt flaccid on right LE.  MD:  Please update activity level so that PT can sit pt EOB on next visit.   Will progress pt as able.  Pt will benefit from skilled PT to increase their independence and safety with mobility to allow discharge to the venue listed below.      Follow Up Recommendations LTACH;Supervision/Assistance - 24 hour    Equipment Recommendations  Other (comment)(TBA)    Recommendations for Other Services       Precautions / Restrictions Precautions Precautions: Fall Restrictions Weight Bearing Restrictions: No      Mobility  Bed Mobility               General bed mobility comments: NT due to pt on bedrest.  Bed level eval today.   Transfers                    Ambulation/Gait                Stairs            Wheelchair Mobility    Modified Rankin (Stroke Patients Only) Modified Rankin (Stroke Patients Only) Pre-Morbid Rankin Score: No symptoms Modified Rankin: Severe disability     Balance                                             Pertinent Vitals/Pain Pain Assessment: No/denies pain    Home Living Family/patient expects to be discharged to:: Private residence Living Arrangements: Children(2 daughters) Available Help at Discharge: Family;Available 24 hours/day Type of Home:  House Home Access: Stairs to enter Entrance Stairs-Rails: None Entrance Stairs-Number of Steps: 1 Home Layout: Two level;Able to live on main level with bedroom/bathroom;1/2 bath on main level Home Equipment: None      Prior Function Level of Independence: Independent               Hand Dominance        Extremity/Trunk Assessment   Upper Extremity Assessment Upper Extremity Assessment: Defer to OT evaluation(Pt had 1/5 shoulder flexion)    Lower Extremity Assessment Lower Extremity Assessment: RLE deficits/detail;LLE deficits/detail RLE Deficits / Details: flaccid without active movement LLE Deficits / Details: 2+/5 grossly       Communication   Communication: Prefers language other than [place];Interpreter utilized;Other (comment)(Spanish speaking, ventilator intubated)  Cognition Arousal/Alertness: Lethargic Behavior During Therapy: Flat affect Overall Cognitive Status: Difficult to assess                                        General Comments General comments (skin integrity, edema, etc.): VSS with pt on 5PEEP, 30% FiO2    Exercises General  Exercises - Upper Extremity Shoulder Flexion: AAROM;Both;10 reps;Supine Shoulder ABduction: AAROM;Both;10 reps;Supine Shoulder Horizontal ADduction: AAROM;Both;10 reps;Supine Elbow Flexion: PROM;AAROM;Both;10 reps;Supine General Exercises - Lower Extremity Ankle Circles/Pumps: AROM;Left;10 reps;Supine(PROM on right. ) Heel Slides: AAROM;Left;10 reps;Supine(PROM on right) Straight Leg Raises: AAROM;Left;10 reps;Supine   Assessment/Plan    PT Assessment Patient needs continued PT services  PT Problem List Decreased activity tolerance;Decreased balance;Decreased range of motion;Decreased strength;Decreased mobility;Decreased coordination;Decreased cognition;Decreased knowledge of use of DME;Decreased safety awareness;Decreased knowledge of precautions;Obesity       PT Treatment Interventions DME  instruction;Functional mobility training;Therapeutic activities;Therapeutic exercise;Balance training;Patient/family education;Wheelchair mobility training;Neuromuscular re-education;Cognitive remediation    PT Goals (Current goals can be found in the Care Plan section)  Acute Rehab PT Goals Patient Stated Goal: to get pt home PT Goal Formulation: With family Time For Goal Achievement: 02/28/18 Potential to Achieve Goals: Good    Frequency Min 3X/week   Barriers to discharge        Co-evaluation               AM-PAC PT "6 Clicks" Mobility  Outcome Measure Help needed turning from your back to your side while in a flat bed without using bedrails?: Total Help needed moving from lying on your back to sitting on the side of a flat bed without using bedrails?: Total Help needed moving to and from a bed to a chair (including a wheelchair)?: Total Help needed standing up from a chair using your arms (e.g., wheelchair or bedside chair)?: Total Help needed to walk in hospital room?: Total Help needed climbing 3-5 steps with a railing? : Total 6 Click Score: 6    End of Session Equipment Utilized During Treatment: Other (comment)(VEntilator) Activity Tolerance: Patient limited by fatigue;Patient limited by lethargy Patient left: in bed;with call bell/phone within reach;with bed alarm set;with family/visitor present Nurse Communication: Mobility status;Need for lift equipment PT Visit Diagnosis: Muscle weakness (generalized) (M62.81);Hemiplegia and hemiparesis Hemiplegia - Right/Left: Right Hemiplegia - dominant/non-dominant: Dominant    Time: 1012-1028 PT Time Calculation (min) (ACUTE ONLY): 16 min   Charges:   PT Evaluation $PT Eval Moderate Complexity: 1 Mod          Croix Presley,PT Acute Rehabilitation Services Pager:  [phone]  Office:  [phone]    Berline Lopes 02/14/2018, 1:58 PM

## 2018-02-14 NOTE — Progress Notes (Signed)
eLink Physician-Brief Progress Note Patient Name: Doneta Annunziata DOB: 1929/05/28 MRN: [id number]   Date of Service  02/14/2018  HPI/Events of Note  Pink secretion from trach. Suspecting chf. Has effusion/pna. On trach 30% fio2. On Camera: in synchrony. MAP good. Urine dark. Cr normal. Takes Metalazone at home.   eICU Interventions  Lasix 40 mg IV once for now.      Intervention Category Intermediate Interventions: Respiratory distress - evaluation and management  Ranee Gosselin 02/14/2018, 10:26 PM

## 2018-02-15 ENCOUNTER — Inpatient Hospital Stay (HOSPITAL_COMMUNITY): Payer: Medicare Other

## 2018-02-15 DIAGNOSIS — M7989 Other specified soft tissue disorders: Secondary | ICD-10-CM

## 2018-02-15 LAB — GLUCOSE, CAPILLARY
Glucose-Capillary: 134 mg/dL — ABNORMAL HIGH (ref 70–99)
Glucose-Capillary: 137 mg/dL — ABNORMAL HIGH (ref 70–99)
Glucose-Capillary: 139 mg/dL — ABNORMAL HIGH (ref 70–99)
Glucose-Capillary: 148 mg/dL — ABNORMAL HIGH (ref 70–99)
Glucose-Capillary: 156 mg/dL — ABNORMAL HIGH (ref 70–99)
Glucose-Capillary: 171 mg/dL — ABNORMAL HIGH (ref 70–99)

## 2018-02-15 LAB — BASIC METABOLIC PANEL
Anion gap: 7 (ref 5–15)
BUN: 40 mg/dL — AB (ref 8–23)
CO2: 27 mmol/L (ref 22–32)
Calcium: 8.3 mg/dL — ABNORMAL LOW (ref 8.9–10.3)
Chloride: 109 mmol/L (ref 98–111)
Creatinine, Ser: 0.94 mg/dL (ref 0.44–1.00)
GFR calc Af Amer: >60 mL/min (ref >60)
GFR calc non Af Amer: 55 mL/min — ABNORMAL LOW (ref >60)
Glucose, Bld: 182 mg/dL — ABNORMAL HIGH (ref 70–99)
Potassium: 3.4 mmol/L — ABNORMAL LOW (ref 3.5–5.1)
SODIUM: 143 mmol/L (ref 135–145)

## 2018-02-15 LAB — PHOSPHORUS: Phosphorus: 4 mg/dL (ref 2.5–4.6)

## 2018-02-15 LAB — MAGNESIUM: MAGNESIUM: 2.3 mg/dL (ref 1.7–2.4)

## 2018-02-15 MED ORDER — PRO-STAT SUGAR FREE PO LIQD
30.0000 mL | Freq: Two times a day (BID) | ORAL | Status: DC
  Administered 2018-02-15 – 2018-02-19 (×8): 30 mL
  Filled 2018-02-15 (×8): qty 30

## 2018-02-15 MED ORDER — FENTANYL CITRATE (PF) 100 MCG/2ML IJ SOLN
INTRAMUSCULAR | Status: AC
  Filled 2018-02-15: qty 2

## 2018-02-15 MED ORDER — OSMOLITE 1.2 CAL PO LIQD
1000.0000 mL | ORAL | Status: DC
  Administered 2018-02-15 – 2018-02-17 (×2): 1000 mL
  Filled 2018-02-15 (×5): qty 1000

## 2018-02-15 MED ORDER — POTASSIUM CHLORIDE 20 MEQ/15ML (10%) PO SOLN
40.0000 meq | Freq: Two times a day (BID) | ORAL | Status: AC
  Administered 2018-02-15 (×2): 40 meq
  Filled 2018-02-15 (×2): qty 30

## 2018-02-15 MED ORDER — VITAL HIGH PROTEIN PO LIQD
1000.0000 mL | ORAL | Status: AC
  Administered 2018-02-15: 1000 mL

## 2018-02-15 MED ORDER — VITAL HIGH PROTEIN PO LIQD: 1000.0000 mL | ORAL | Status: DC

## 2018-02-15 MED ORDER — FUROSEMIDE 10 MG/ML IJ SOLN
40.0000 mg | Freq: Once | INTRAMUSCULAR | Status: AC
  Administered 2018-02-15: 40 mg via INTRAVENOUS
  Filled 2018-02-15: qty 4

## 2018-02-15 MED ORDER — FENTANYL CITRATE (PF) 100 MCG/2ML IJ SOLN
50.0000 ug | Freq: Once | INTRAMUSCULAR | Status: AC
  Administered 2018-02-15: 50 ug via INTRAVENOUS

## 2018-02-15 NOTE — Progress Notes (Signed)
Nutrition Follow-up  DOCUMENTATION CODES:   Not applicable  INTERVENTION:   D/C Vital High Protein   Initiate Osmolite 1.2 @ 40 ml/hr (960 ml/day) 30 ml Prostat BID  Provides: 1352 kcal, 83 grams protein, and 778 ml free water.  Total free water: 1578 ml   NUTRITION DIAGNOSIS:   Inadequate oral intake related to inability to eat as evidenced by NPO status. Ongoing  GOAL:   Patient will meet greater than or equal to 90% of their needs Met.   MONITOR:   Vent status, TF tolerance  ASSESSMENT:   Pt is spanish speaking only with PMH of suspected HTN, CHF, afib admitted with left large MCA stroke with hemorrhagic conversion.    Pt discussed during ICU rounds and with RN.  2/11 trach 2/7 Cortrak Plan for PEG next week Started lasix overnight.   Patient is currently intubated on ventilator support MV: 9 L/min Temp (24hrs), Avg:98.7 F (37.1 C), Min:98.5 F (36.9 C), Max:99 F (37.2 C)  Medications reviewed: colace, miralax, lasix, 40 mEq KCl BID 400 ml free water BID = 800 ml Labs reviewed: K+ 3.4 (L) + 9.4 L with moderate edema   Diet Order:   Diet Order            Diet NPO time specified  Diet effective midnight              EDUCATION NEEDS:   No education needs have been identified at this time  Skin:  Skin Assessment: Reviewed RN Assessment  Last BM:  2/14  Height:   Ht Readings from Last 1 Encounters:  02/05/18 5' 3"  (1.6 m)    Weight:   Wt Readings from Last 1 Encounters:  02/15/18 77 kg    Ideal Body Weight:  52.2 kg  BMI:  Body mass index is 30.07 kg/m.  Estimated Nutritional Needs:   Kcal:  1360  Protein:  80-95 grams  Fluid:  > 1.5 L/day  Maylon Peppers RD, LDN, CNSC [phone] Pager [phone] After Hours Pager

## 2018-02-15 NOTE — Progress Notes (Signed)
.   [name], MRN:  [id number], DOB:  04-04-1929, LOS: 19 ADMISSION DATE:  01/27/2018, CONSULTATION DATE:  1/26  REFERRING MD:  Mesner, CHIEF COMPLAINT:  Acute encephalopathy   Brief History   85 y/o female admitted on 1/26 with embolic left MCA stroke.  Outside window for TPA or intervention.  Past Medical History  HTN, CHF  Significant Hospital Events   Admission/intubation 1/26 Increasing mass effect and hemorrhagic conversion Palliative care consulted to clarify goals of care.  Consults:  PCCM Neurology Palliative  Procedures:  1/26 intubated>>2/11 2/11 trach (JY)>>>  Significant Diagnostic Tests:  CTA 1/26: 1. Complete occlusion of the left M1 segment due to an embolus. Focal narrowing and irregularity of the mid cervical ICA on the left with diameter of 2.6 mm. This probably represents ulcerated soft plaque and this could be a source of embolus. MRI brain 1/27> large ischemic left MCA stroke, no midline shift ECHO 1/27> EF 55-65%, moderate LVH pattern CT head 1/28> evolving L MCA infarct with new 51mm midline shift CXR 1/31- bilateral infiltrates and bilateral pleural effusions CXR 2/1 - BL vascular congestion ETT 1 cm from carina   Micro Data:  1/26 MRSA> neg  Antimicrobials:  None  Interim history/subjective:  Required full vent yesterday due to tachypnea. Having thin secretions. Diuresed overnight.  Objective   Blood pressure 119/63, pulse 75, temperature 99 F (37.2 C), temperature source Oral, resp. rate (!) 21, height 5\' 3"  (1.6 m), weight 77 kg, SpO2 99 %.    Vent Mode: PRVC FiO2 (%):  [30 %] 30 % Set Rate:  [20 bmp] 20 bmp Vt Set:  [320 mL] 320 mL PEEP:  [5 cmH20] 5 cmH20 Plateau Pressure:  [14 cmH20-18 cmH20] 18 cmH20   Intake/Output Summary (Last 24 hours) at 02/15/2018 1108 Last data filed at 02/15/2018 1000 Gross per 24 hour  Intake 1661.53 ml  Output 1375 ml  Net 286.53 ml   Filed Weights   02/12/18 0500 02/13/18 0455 02/15/18  0500  Weight: 72 kg 76.3 kg 77 kg    Physical Exam: General: Elderly frail female, no acute distress HENT: [place], AT, OP clear, MMM Eyes: EOMI, no scleral icterus Neck: Trach midline, surrounding dressing, clean and dry Respiratory: Anterior rhonchi bilaterally Cardiovascular: RRR, -M/R/G, no JVD GI: BS+, soft, nontender Extremities: RUE edema +1, trace edema in lower extremities, space boots Neuro: Awake, aphasic, intermittently follows commands Skin: Intact, no rashes or bruising GU: Foley in place   Resolved Hospital Problem list   Hypotension, AKI  Hypernatremia  Assessment & Plan:   Acute respiratory insufficiency secondary to inability to protect airway S/p tracheostomy Requiring full vent support in last 24 hours. Increased secretions. CXR 2/14 reviewed by me with left atelectasis and bibasilar effusions. -Routine trach care -Pulmonary toilet: CPT, frequent suctioning -SBT as tolerated -Diurese today -Obtain trach aspirate  L MCA CVA s/p trach -DC Provigil. Patient not taking at home -Awaiting PEG tube placement -ASA and statin  RUE swelling -[place] RUE ordered  Hypokalemia  -Monitor electrolytes -Replete today  Goals of Care -DNR -Pending LTAC placement  Best practice:  Diet: TF Pain/Anxiety/Delirium protocol (if indicated): PAD RASS goal 0 to -1 VAP protocol (if indicated): Yes DVT prophylaxis: SCD, sq Heparin GI prophylaxis: Pantoprazole  Glucose control: Monitor CBG Mobility: bed rest Code Status: DNR Family Communication: Updated family at bedside on 2/14 Disposition: Remain in ICU  Labs   CBC: Recent Labs  Lab 02/10/18 0242 02/10/18 0432 02/11/18 0429 02/12/18 2009  02/14/18 0357  WBC 15.2*  --  12.1* 20.6* 16.9*  NEUTROABS  --   --  10.2*  --   --   HGB 10.9* 10.5* 10.8* 13.3 10.4*  HCT 35.8* 31.0* 35.5* 44.9 32.8*  MCV 93.0  --  92.7 94.7 92.7  PLT 329  --  319 410* 323    Basic Metabolic Panel: Recent Labs  Lab 02/10/18 0242   02/11/18 0429 02/12/18 0541 02/13/18 0450 02/14/18 0357 02/15/18 0320  NA 146*   < > 145 145 142 144 143  K 3.4*   < > 3.4* 4.4 4.1 3.9 3.4*  CL 108  --  110 108 106 110 109  CO2 28  --  28 26 25 26 27   GLUCOSE 169*  --  162* 122* 197* 130* 182*  BUN 48*  --  47* 43* 47* 42* 40*  CREATININE 0.91  --  0.89 0.93 1.02* 0.88 0.94  CALCIUM 8.2*  --  8.0* 8.3* 8.3* 8.1* 8.3*  MG 2.5*  --  2.4  --   --  2.4 2.3  PHOS 3.5  --  3.4  --   --  3.8 4.0   < > = values in this interval not displayed.   GFR: Estimated Creatinine Clearance: 42.2 mL/min (by C-G formula based on SCr of 0.94 mg/dL). Recent Labs  Lab 02/10/18 0242 02/11/18 0429 02/12/18 2009 02/14/18 0357  WBC 15.2* 12.1* 20.6* 16.9*    Liver Function Tests: No results for input(s): AST, ALT, ALKPHOS, BILITOT, PROT, ALBUMIN in the last 168 hours. No results for input(s): LIPASE, AMYLASE in the last 168 hours. No results for input(s): AMMONIA in the last 168 hours.  ABG    Component Value Date/Time   PHART 7.514 (H) 02/11/2018 0236   PCO2ART 36.3 02/11/2018 0236   PO2ART 95.3 02/11/2018 0236   HCO3 29.1 (H) 02/11/2018 0236   TCO2 32 02/10/2018 0432   O2SAT 97.7 02/11/2018 0236     Coagulation Profile: Recent Labs  Lab 02/11/18 0429  INR 1.11    Cardiac Enzymes: No results for input(s): CKTOTAL, CKMB, CKMBINDEX, TROPONINI in the last 168 hours.  HbA1C: Hgb A1c MFr Bld  Date/Time Value Ref Range Status  01/28/2018 06:39 AM 5.5 4.8 - 5.6 % Final    Comment:    (NOTE) Pre diabetes:          5.7%-6.4% Diabetes:              >6.4% Glycemic control for   <7.0% adults with diabetes     CBG: Recent Labs  Lab 02/14/18 1532 02/14/18 1922 02/14/18 2319 02/15/18 0331 02/15/18 0753  GLUCAP 104* 121* 135* 171* 156*   The patient is critically ill with multiple organ systems failure and requires high complexity decision making for assessment and support, frequent evaluation and titration of therapies,  application of advanced monitoring technologies and extensive interpretation of multiple databases.   Critical Care Time devoted to patient care services described in this note is 35  Minutes. This time reflects time of care of this signee Dr. Mechele Collin. This critical care time does not reflect procedure time, or teaching time or supervisory time of PA/NP/Med student/Med Resident etc but could involve care discussion time.  Mechele Collin, M.D. [facility] Pulmonary/Critical Care Medicine Pager: [phone] After hours pager: [phone]

## 2018-02-15 NOTE — Consult Note (Signed)
Chief Complaint: Patient was seen in consultation today for percutaneous gastric tube placement Chief Complaint  Patient presents with  . Code Stroke   at the request of Dr Marvis Repress  Supervising Physician: Gilmer Mor  Patient Status: [facility] - In-pt  History of Present Illness: Kelli Vazquez is a 85 y.o. 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   Allergies: Latex and Penicillins  Medications: Prior to Admission medications   Medication Sig Start Date End Date Taking? Authorizing Provider  aspirin EC 81 MG tablet Take 81 mg by mouth daily.   Yes [provider]  diltiazem (DILACOR XR) 120 MG 24 hr capsule Take 120 mg by mouth daily.   Yes [provider]  meclizine (ANTIVERT) 12.5 MG tablet Take 12.5 mg by mouth 3 (three) times daily as needed for dizziness.   Yes [provider]  metolazone (ZAROXOLYN) 2.5 MG tablet Take 2.5 mg by mouth every 7 (seven) days.   Yes [provider]  potassium chloride (K-DUR,KLOR-CON) 10 MEQ tablet Take 20 mEq by mouth daily.   Yes [provider]     No family history on file.  Social History   Socioeconomic History  . Marital status: Unknown    Spouse name: Not on file  . Number of children: Not on file  . Years of education: Not on file  . Highest education level: Not on file  Occupational History  . Not on file  Social Needs  . Financial resource strain: Not on file  . Food insecurity:    Worry: Not on file    Inability: Not on file  . Transportation needs:    Medical: Not on file    Non-medical: Not on file  Tobacco Use  . Smoking status: Not  on file  Substance and Sexual Activity  . Alcohol use: Not on file  . Drug use: Not on file  . Sexual activity: Not on file  Lifestyle  . Physical activity:    Days per week: Not on file    Minutes per session: Not on file  . Stress: Not on file  Relationships  . Social connections:    Talks on phone: Not on file    Gets together: Not on file    Attends religious service: Not on file    Active member of club or organization: Not on file    Attends meetings of clubs or organizations: Not on file    Relationship status: Not on file  Other Topics Concern  . Not on file  Social History Narrative  . Not on file     Review of Systems: A 12 point ROS discussed and pertinent positives are indicated in the HPI above.  All other systems are negative.   Vital Signs: BP (!) 112/55   Pulse (!) 101   Temp 99 F (37.2 C) (Oral)   Resp (!) 26   Ht 5\' 3"  (1.6 m)   Wt 169 lb 12.1 oz (77 kg)   SpO2 99%   BMI 30.07 kg/m   Physical Exam Vitals signs reviewed.  Constitutional:      Comments: Vent;trach  Cardiovascular:     Rate and  Rhythm: Normal rate.  Pulmonary:     Comments: vent Abdominal:     Palpations: Abdomen is soft.  Musculoskeletal:     Comments: No response  Skin:    General: Skin is warm and dry.  Psychiatric:     Comments: Will need consent from family No answer this am     Imaging: Ct Abdomen Wo Contrast  Result Date: 02/13/2018 CLINICAL DATA:  Evaluate anatomy for potential percutaneous gastrostomy tube placement. EXAM: CT ABDOMEN WITHOUT CONTRAST TECHNIQUE: Multidetector CT imaging of the abdomen was performed following the standard protocol without IV contrast. COMPARISON:  Chest radiograph-02/12/2018; abdominal radiographs-01/28/2018 FINDINGS: The lack of intravenous contrast limits the ability to evaluate solid abdominal organs. Lower chest: Limited visualization of the lower thorax demonstrates small/trace bilateral pleural effusions and associated dense  consolidative opacities within the imaged bilateral lobes with associated air bronchograms. Ill-defined heterogeneous ground-glass opacities are seen within the imaged right upper and middle lobes. Borderline cardiomegaly. Coronary artery calcifications. No pericardial effusion. Hepatobiliary: There is mild nodularity of the hepatic contour as could be seen in the setting of cirrhotic change (image 30, series 3). Post cholecystectomy. No ascites. Pancreas: Normal noncontrast appearance of the pancreas. Spleen: Normal noncontrast appearance of the spleen. Adrenals/Urinary Tract: Hypoattenuating renal lesions are seen bilaterally with dominant partially exophytic left-sided renal lesion measuring approximately 6.3 cm (image 28, series 3) and dominant left-sided renal lesion measuring approximately 4.3 cm (image 20, series 3), incompletely characterized on this noncontrast examination though likely representative of renal cysts. No urine obstruction or perinephric stranding. Normal appearance the bilateral adrenal glands. The urinary bladder was not imaged. Stomach/Bowel: There appears to be an adequate percutaneous window to the ventral aspect of the mid body of the stomach (representative image 37, series 3), without interposed liver or colon. Enteric tube tip terminates within the duodenal bulb. Radiopaque material is seen within the transverse colon. No evidence of enteric obstruction. No pneumoperitoneum, pneumatosis or portal venous gas. Vascular/Lymphatic: Atherosclerotic plaque within a normal caliber abdominal aorta. No bulky retroperitoneal or mesenteric lymphadenopathy. Other: Diffuse body wall anasarca. Multiple calcifications are seen about the buttocks bilaterally. Musculoskeletal: No acute or aggressive osseous abnormalities. Stigmata of DISH within the lower thoracic spine. IMPRESSION: 1. Gastric anatomy amenable to potential percutaneous gastrostomy tube placement as indicated. 2. Trace/small bilateral  effusions with dense consolidative opacities within the imaged bilateral lower lobes with associated air bronchograms and scattered ill-defined ground-glass opacities within the imaged right upper and middle lobes - constellation of findings are nonspecific though worrisome for multifocal infection and/or aspiration. Clinical correlation is advised. 3. Nodularity hepatic contour as could be seen in the setting of early cirrhotic change. Correlation with LFTs could be performed as indicated. 4.  Aortic Atherosclerosis (ICD10-I70.0). Electronically Signed   By: Simonne ComeJohn  Watts M.D.   On: 02/13/2018 15:20   Ct Angio Head W Or Wo Contrast  Result Date: 01/27/2018 CLINICAL DATA:  Code stroke. Found on the ground. Right-sided weakness. EXAM: CT ANGIOGRAPHY HEAD AND NECK TECHNIQUE: Multidetector CT imaging of the head and neck was performed using the standard protocol during bolus administration of intravenous contrast. Multiplanar CT image reconstructions and MIPs were obtained to evaluate the vascular anatomy. Carotid stenosis measurements (when applicable) are obtained utilizing NASCET criteria, using the distal internal carotid diameter as the denominator. CONTRAST:  50mL ISOVUE-370 IOPAMIDOL (ISOVUE-370) INJECTION 76% COMPARISON:  CT earlier same day FINDINGS: CTA NECK FINDINGS Aortic arch: Aortic atherosclerosis with considerable luminal irregularity. Branching pattern of great toe cephalic vessels from  the arch is normal. No origin stenosis greater than 30%. Right carotid system: Right common carotid artery is tortuous but widely patent to the bifurcation. Carotid bifurcation is free of atherosclerotic plaque or narrowing. Right cervical ICA is widely patent though tortuous. Left carotid system: Common carotid artery is tortuous but widely patent to the bifurcation. Carotid bifurcation shows minimal calcified plaque at the proximal ICA but there is no stenosis. Cervical ICA shows focal narrowing with wall thickening  and irregularity in its midportion with diameter of 2.6 mm. This is a stenosis of about 50%. The wall irregularity presumably represents plaque/thrombus and could be a source of embolus. Vertebral arteries: There is atherosclerotic plaque at both vertebral artery origins but no stenosis greater than 30%. Beyond that, both vertebral arteries are widely patent through the cervical region to the foramen magnum. Skeleton: Ordinary cervical spondylosis. Upper thoracic congenital failure of separation. Other neck: No mass or lymphadenopathy. Upper chest: Right pleural effusion. Pulmonary scarring. Question interstitial edema. Review of the MIP images confirms the above findings CTA HEAD FINDINGS Anterior circulation: Both internal carotid arteries are patent through the siphon region. On the right, there is atherosclerotic calcification but no stenosis. The anterior and middle cerebral vessels are patent. On the left, there is complete occlusion of the left M1 segment due to an embolus. Flow is present within the left anterior cerebral artery. There is poor collateral demonstration, though the contrast phase is quite early. Posterior circulation: Both vertebral arteries are patent to the basilar. Early phase contrast enhancement makes it difficult to accurately evaluate the distal vertebral arteries but I think both are sufficiently patent to the basilar. No basilar stenosis is seen. Superior cerebellar and posterior cerebral vessels are patent, though there distal branches are not yet opacified. Venous sinuses: Too early to assess venous sinuses. Anatomic variants: None other significant. Delayed phase: Not performed. Review of the MIP images confirms the above findings IMPRESSION: 1. Complete occlusion of the left M1 segment due to an embolus. Flow is present within the left anterior cerebral artery. 2. Atherosclerotic disease at both carotid bifurcations but no stenosis. 3. Focal narrowing and irregularity of the mid  cervical ICA on the left with diameter of 2.6 mm. This probably represents ulcerated soft plaque and this could be a source of embolus. 4. These results were communicated to Dr. Otelia Limes at 4:50 pmon 1/26/2020by text page via the [facility] messaging system. Electronically Signed   By: Paulina Fusi M.D.   On: 01/27/2018 16:54   Ct Head Wo Contrast  Result Date: 01/29/2018 CLINICAL DATA:  Stroke follow-up EXAM: CT HEAD WITHOUT CONTRAST TECHNIQUE: Contiguous axial images were obtained from the base of the skull through the vertex without intravenous contrast. COMPARISON:  Brain MRI 01/28/2018 FINDINGS: Brain: There is cytotoxic edema throughout the left MCA distribution with areas of confluent petechial hemorrhage as demonstrated on the prior MRI. There is 2 mm of rightward midline shift. Mass effect on the left lateral ventricle has increased. There is encephalomalacia at the site of an old left occipital lobe infarct. Vascular: No abnormal hyperdensity of the major intracranial arteries or dural venous sinuses. No intracranial atherosclerosis. Skull: The visualized skull base, calvarium and extracranial soft tissues are normal. Sinuses/Orbits: Secretions within both maxillary sinuses. The orbits are normal. IMPRESSION: 1. Evolving left MCA distribution infarct with increased mass effect on the left lateral ventricle and 2 mm of rightward midline shift. 2. Confluent petechial hemorrhage throughout the infarcted territory, unchanged from the earlier MRI, allowing for differences  in modality. Electronically Signed   By: Deatra Robinson M.D.   On: 01/29/2018 03:54   Ct Angio Neck W Or Wo Contrast  Result Date: 01/27/2018 CLINICAL DATA:  Code stroke. Found on the ground. Right-sided weakness. EXAM: CT ANGIOGRAPHY HEAD AND NECK TECHNIQUE: Multidetector CT imaging of the head and neck was performed using the standard protocol during bolus administration of intravenous contrast. Multiplanar CT image reconstructions and MIPs  were obtained to evaluate the vascular anatomy. Carotid stenosis measurements (when applicable) are obtained utilizing NASCET criteria, using the distal internal carotid diameter as the denominator. CONTRAST:  50mL ISOVUE-370 IOPAMIDOL (ISOVUE-370) INJECTION 76% COMPARISON:  CT earlier same day FINDINGS: CTA NECK FINDINGS Aortic arch: Aortic atherosclerosis with considerable luminal irregularity. Branching pattern of great toe cephalic vessels from the arch is normal. No origin stenosis greater than 30%. Right carotid system: Right common carotid artery is tortuous but widely patent to the bifurcation. Carotid bifurcation is free of atherosclerotic plaque or narrowing. Right cervical ICA is widely patent though tortuous. Left carotid system: Common carotid artery is tortuous but widely patent to the bifurcation. Carotid bifurcation shows minimal calcified plaque at the proximal ICA but there is no stenosis. Cervical ICA shows focal narrowing with wall thickening and irregularity in its midportion with diameter of 2.6 mm. This is a stenosis of about 50%. The wall irregularity presumably represents plaque/thrombus and could be a source of embolus. Vertebral arteries: There is atherosclerotic plaque at both vertebral artery origins but no stenosis greater than 30%. Beyond that, both vertebral arteries are widely patent through the cervical region to the foramen magnum. Skeleton: Ordinary cervical spondylosis. Upper thoracic congenital failure of separation. Other neck: No mass or lymphadenopathy. Upper chest: Right pleural effusion. Pulmonary scarring. Question interstitial edema. Review of the MIP images confirms the above findings CTA HEAD FINDINGS Anterior circulation: Both internal carotid arteries are patent through the siphon region. On the right, there is atherosclerotic calcification but no stenosis. The anterior and middle cerebral vessels are patent. On the left, there is complete occlusion of the left M1  segment due to an embolus. Flow is present within the left anterior cerebral artery. There is poor collateral demonstration, though the contrast phase is quite early. Posterior circulation: Both vertebral arteries are patent to the basilar. Early phase contrast enhancement makes it difficult to accurately evaluate the distal vertebral arteries but I think both are sufficiently patent to the basilar. No basilar stenosis is seen. Superior cerebellar and posterior cerebral vessels are patent, though there distal branches are not yet opacified. Venous sinuses: Too early to assess venous sinuses. Anatomic variants: None other significant. Delayed phase: Not performed. Review of the MIP images confirms the above findings IMPRESSION: 1. Complete occlusion of the left M1 segment due to an embolus. Flow is present within the left anterior cerebral artery. 2. Atherosclerotic disease at both carotid bifurcations but no stenosis. 3. Focal narrowing and irregularity of the mid cervical ICA on the left with diameter of 2.6 mm. This probably represents ulcerated soft plaque and this could be a source of embolus. 4. These results were communicated to Dr. Otelia Limes at 4:50 pmon 1/26/2020by text page via the [facility] messaging system. Electronically Signed   By: Paulina Fusi M.D.   On: 01/27/2018 16:54   Ct Cervical Spine Wo Contrast  Result Date: 01/27/2018 CLINICAL DATA:  Found down. Stroke. Clinical concern for cervical spine fracture. EXAM: CT CERVICAL SPINE WITHOUT CONTRAST TECHNIQUE: Multidetector CT imaging of the cervical spine was performed without  intravenous contrast. Multiplanar CT image reconstructions were also generated. COMPARISON:  Head CT obtained at the same time. FINDINGS: Alignment: Minimal reversal of the normal lordosis. No subluxations. Skull base and vertebrae: No acute fracture. No primary bone lesion or focal pathologic process. Soft tissues and spinal canal: No prevertebral fluid or swelling. No visible  canal hematoma. Disc levels:  Multilevel degenerative changes. Upper chest: Minimal biapical bullous changes. Small to moderate-sized right pleural effusion. Other: Endotracheal and orogastric tubes are in place. Small right lobe thyroid nodule measuring 6 mm in maximum diameter. Minimal carotid artery calcification on the left. IMPRESSION: 1. No fracture or subluxation. 2. Minimal reversal of the normal lordosis. 3. Multilevel degenerative changes. 4. Small to moderate-sized right pleural effusion. 5. Minimal left carotid artery atheromatous calcification. 6. 6 mm right lobe thyroid nodule. This is too small to characterize, but most likely benign in the absence of known clinical risk factors for thyroid carcinoma. Electronically Signed   By: Beckie Salts M.D.   On: 01/27/2018 16:46   Mr Brain Wo Contrast  Result Date: 01/28/2018 CLINICAL DATA:  Found unconscious at home.  MCA occlusion. EXAM: MRI HEAD WITHOUT CONTRAST TECHNIQUE: Multiplanar, multiecho pulse sequences of the brain and surrounding structures were obtained without intravenous contrast. COMPARISON:  CTA head neck 01/27/2018 FINDINGS: BRAIN: Large area of ischemic infarct within the left MCA territory, involving most of the frontal operculum and insula, as well as parts of the left basal ganglia and left temporal lobe. The midline structures are normal. There is moderate edema throughout the ischemic territory. Early confluent hyperintense T2-weighted signal of the periventricular and deep white matter, most commonly due to chronic ischemic microangiopathy. There is petechial hemorrhage throughout the infarcted area. There is laminar necrosis at a site of remote infarct in the left occipital lobe. VASCULAR: Major intracranial arterial and venous sinus flow voids are normal. SKULL AND UPPER CERVICAL SPINE: Right frontal scalp hematoma. SINUSES/ORBITS: No fluid levels or advanced mucosal thickening. No mastoid or middle ear effusion. The orbits are  normal. IMPRESSION: 1. Large ischemic infarct within the left MCA territory with confluent petechial hemorrhage throughout the affected area Heidelberg classification 1b: HI2, confluent petechiae, no mass effect. 2. No midline shift or hydrocephalus. Electronically Signed   By: Deatra Robinson M.D.   On: 01/28/2018 04:50   Dg Chest Port 1 View  Result Date: 02/15/2018 CLINICAL DATA:  Respiratory failure EXAM: PORTABLE CHEST 1 VIEW COMPARISON:  Three days ago FINDINGS: Tracheostomy tube and feeding tube in stable unremarkable position. Improved left lower lobe aeration. Continued hazy opacities at both bases. No pneumothorax or Kerley lines. Chronic cardiomegaly. IMPRESSION: Lower chest opacity with atelectasis and pleural fluid on recent abdominal CT. Inflation is mildly improved from 3 days ago. Electronically Signed   By: Marnee Spring M.D.   On: 02/15/2018 08:48   Dg Chest Port 1 View  Result Date: 02/12/2018 CLINICAL DATA:  Post tracheostomy placement. EXAM: PORTABLE CHEST 1 VIEW COMPARISON:  02/11/2018 and earlier. FINDINGS: Tracheostomy tube tip in satisfactory position below the thoracic inlet projecting approximately 5 cm above the carina. No evidence of pneumothorax or pneumomediastinum. Consolidation involving the lower lobes with silhouetting of the hemidiaphragms, unchanged on the RIGHT and worse on the LEFT since yesterday. Pulmonary vascularity normal. Cardiac silhouette mildly enlarged, unchanged. Feeding tube courses below the diaphragm into the stomach though its tip is not included on the image. Degenerative changes involving the RIGHT shoulder joint. IMPRESSION: 1. Tracheostomy tube tip in satisfactory position projecting approximately  5 cm above the carina. No acute complicating features. 2. Bilateral lower lobe atelectasis and/or pneumonia, unchanged on the RIGHT and worse on the LEFT since yesterday. Electronically Signed   By: Hulan Saas M.D.   On: 02/12/2018 12:55   Dg Chest  Port 1 View  Result Date: 02/11/2018 CLINICAL DATA:  Endotracheal tube, stroke. EXAM: PORTABLE CHEST 1 VIEW COMPARISON:  02/10/2018. FINDINGS: Endotracheal tube terminates 3.6 cm above the carina. Feeding tube is followed into the stomach with the tip projecting beyond the inferior margin of the image. Heart is enlarged, stable. Thoracic aorta is calcified. Left lower lobe collapse/consolidation. Moderate bilateral pleural effusions appear stable. Degenerative changes in the shoulders. IMPRESSION: 1. Left lower lobe collapse/consolidation, as before, may be due to pneumonia. 2. Bilateral pleural effusions, stable. 3.  Aortic atherosclerosis (ICD10-170.0). Electronically Signed   By: Leanna Battles M.D.   On: 02/11/2018 07:45   Dg Chest Port 1 View  Result Date: 02/10/2018 CLINICAL DATA:  Endotracheal tube EXAM: PORTABLE CHEST 1 VIEW COMPARISON:  Eight days ago FINDINGS: There is a feeding tube which at least reaches the stomach. Endotracheal tube tip projects between the clavicular heads and carina, partially obscured by overlapping enteric tube. New hazy density at the bases. Cardiomegaly. No Kerley lines or pneumothorax. IMPRESSION: 1. Unremarkable hardware positioning. 2. New hazy opacity at the bases likely reflecting atelectasis and pleural fluid in this setting. Electronically Signed   By: Marnee Spring M.D.   On: 02/10/2018 08:24   Dg Chest Port 1 View  Result Date: 02/02/2018 CLINICAL DATA:  Respiratory failure. EXAM: PORTABLE CHEST 1 VIEW COMPARISON:  02/01/2018. FINDINGS: The heart remains enlarged. ET tube unchanged, 1 cm above carina should be pulled back at least 2 cm. Improved aeration, clearing of basilar atelectasis and vascular congestion. Decreased pleural effusions. Thoracic atherosclerosis. No acute osseous findings. Nasogastric tube in stomach. IMPRESSION: Improved aeration. ET tube 1 cm above carina. Electronically Signed   By: Elsie Stain M.D.   On: 02/02/2018 08:14   Dg Chest  Port 1 View  Result Date: 02/01/2018 CLINICAL DATA:  Intubation.  Stroke. EXAM: PORTABLE CHEST 1 VIEW COMPARISON:  01/28/2018.  01/27/2018. FINDINGS: Endotracheal tube tip 9 mm above the carina. Proximal repositioning of approximately 2-3 cm should be considered. NG tube tip noted below left hemidiaphragm. Stable cardiomegaly. Progressive bilateral pulmonary infiltrates and bilateral pleural effusions, particular prominent the right. CHF could present in this fashion. Bilateral pneumonia can not be excluded. No pneumothorax. IMPRESSION: 1. Endotracheal tube tip 9 mm above the carina. Proximal repositioning of approximately 2-3 cm should be considered. NG tube in stable position. 2. Cardiomegaly. Bilateral pulmonary infiltrates and bilateral pleural effusions, right side greater than left noted on today's exam. Findings suggest CHF. Bilateral pneumonia can not be excluded. Electronically Signed   By: Maisie Fus  Register   On: 02/01/2018 06:37   Dg Chest Port 1 View  Result Date: 01/28/2018 CLINICAL DATA:  Respiratory failure.  Endotracheal tube placement. EXAM: PORTABLE CHEST 1 VIEW COMPARISON:  One-view chest x-ray 01/27/2018 FINDINGS: Endotracheal tube is stable. NG to courses into the stomach. The heart is enlarged. Moderate pulmonary vascular congestion remains. Aortic atherosclerosis is present. Bibasilar atelectasis is unchanged. IMPRESSION: 1. Similar appearance of mild bibasilar atelectasis. 2. Cardiomegaly and mild pulmonary vascular congestion. 3. Support apparatus is stable. Electronically Signed   By: Marin Roberts M.D.   On: 01/28/2018 08:03   Dg Chest Portable 1 View  Result Date: 01/27/2018 CLINICAL DATA:  Status post intubation EXAM:  PORTABLE CHEST 1 VIEW COMPARISON:  None. FINDINGS: Endotracheal tube is noted 2.5 cm above the carina. Gastric catheter extends into the stomach. Cardiac shadow is mildly enlarged. Aortic calcifications are seen. Bibasilar atelectatic changes are noted as  well as suggestion of small effusions. No bony abnormality is noted. IMPRESSION: Tubes and lines as described above. Bibasilar atelectasis and likely small effusions. Electronically Signed   By: Alcide Clever M.D.   On: 01/27/2018 16:24   Dg Abd Portable 1v  Result Date: 01/28/2018 CLINICAL DATA:  MRI clearance EXAM: PORTABLE ABDOMEN - 1 VIEW COMPARISON:  None. FINDINGS: The bowel gas pattern is normal. No radio-opaque calculi or other significant radiographic abnormality are seen. Nasogastric tube tip and side port project over the stomach. Surgical clips at the gallbladder fossa. IMPRESSION: No metallic contraindication to MRI within the abdomen. Electronically Signed   By: Deatra Robinson M.D.   On: 01/28/2018 01:59   Ct Head Code Stroke Wo Contrast  Result Date: 01/27/2018 CLINICAL DATA:  Code stroke. Last seen normal 2100 hours yesterday. Flaccid on the right side. EXAM: CT HEAD WITHOUT CONTRAST TECHNIQUE: Contiguous axial images were obtained from the base of the skull through the vertex without intravenous contrast. COMPARISON:  None. FINDINGS: Brain: Brainstem and cerebellum are unremarkable except for chronic small-vessel ischemic changes of the pons. Old infarction in the left occipital lobe. Acute infarction in the left hemisphere in the MCA territory affecting the insula, deep white matter and cortical brain in 3 middle cerebral artery sectors. No evidence of hemorrhage. No mass effect or shift. Chronic small-vessel ischemic changes affect the white matter elsewhere. Vascular: Hyperdense left MCA in the distal M1/bifurcation region. Skull: Negative Sinuses/Orbits: Clear/normal Other: None ASPECTS (Alberta Stroke Program Early CT Score) - Ganglionic level infarction (caudate, lentiform nuclei, internal capsule, insula, M1-M3 cortex): 4 - Supraganglionic infarction (M4-M6 cortex): 2 Total score (0-10 with 10 being normal): 6 IMPRESSION: 1. Acute infarction in the left MCA territory affecting the  insula, deep white matter and cortical brain in 3 middle cerebral artery sectors. No hemorrhage or mass effect. 2. ASPECTS is 6. 3. Hyperdense left MCA. *This report was discussed by telephone with Dr. Otelia Limes at 1621 hours. Electronically Signed   By: Paulina Fusi M.D.   On: 01/27/2018 16:31    Labs:  CBC: Recent Labs    02/10/18 0242 02/10/18 0432 02/11/18 0429 02/12/18 2009 02/14/18 0357  WBC 15.2*  --  12.1* 20.6* 16.9*  HGB 10.9* 10.5* 10.8* 13.3 10.4*  HCT 35.8* 31.0* 35.5* 44.9 32.8*  PLT 329  --  319 410* 323    COAGS: Recent Labs    01/27/18 1553 02/11/18 0429  INR 1.12 1.11  APTT 28 37*    BMP: Recent Labs    02/12/18 0541 02/13/18 0450 02/14/18 0357 02/15/18 0320  NA 145 142 144 143  K 4.4 4.1 3.9 3.4*  CL 108 106 110 109  CO2 26 25 26 27   GLUCOSE 122* 197* 130* 182*  BUN 43* 47* 42* 40*  CALCIUM 8.3* 8.3* 8.1* 8.3*  CREATININE 0.93 1.02* 0.88 0.94  GFRNONAA 56* 50* 59* 55*  GFRAA >60 58* >60 >60    LIVER FUNCTION TESTS: Recent Labs    01/27/18 1553 01/28/18 0639  BILITOT 1.1 1.7*  AST 66* 74*  ALT 38 37  ALKPHOS 66 63  PROT 7.6 7.6  ALBUMIN 3.6 3.4*    TUMOR MARKERS: No results for input(s): AFPTM, CEA, CA199, CHROMGRNA in the last 8760 hours.  Assessment  and Plan:  CVA Hemorrhagic conversion Dysphagia Need for long term care Scheduled for probable percutaneous gastric tube in IR Mon Will need consent from family NA on phone this am Will try again later Wbc 16.9; afeb Will recheck wbc on Mon am  Thank you for this interesting consult.  I greatly enjoyed meeting Kelli Vazquez and look forward to participating in their care.  A copy of this report was sent to the requesting provider on this date.  Electronically Signed: Robet Leu, PA-C 02/15/2018, 12:07 PM   I spent a total of 40 Minutes    in face to face in clinical consultation, greater than 50% of which was counseling/coordinating care for percutaneous gastric tube  placement

## 2018-02-16 ENCOUNTER — Inpatient Hospital Stay (HOSPITAL_COMMUNITY): Payer: Medicare Other

## 2018-02-16 DIAGNOSIS — M7989 Other specified soft tissue disorders: Secondary | ICD-10-CM

## 2018-02-16 DIAGNOSIS — R609 Edema, unspecified: Secondary | ICD-10-CM

## 2018-02-16 LAB — GLUCOSE, CAPILLARY
Glucose-Capillary: 125 mg/dL — ABNORMAL HIGH (ref 70–99)
Glucose-Capillary: 139 mg/dL — ABNORMAL HIGH (ref 70–99)
Glucose-Capillary: 139 mg/dL — ABNORMAL HIGH (ref 70–99)
Glucose-Capillary: 142 mg/dL — ABNORMAL HIGH (ref 70–99)
Glucose-Capillary: 151 mg/dL — ABNORMAL HIGH (ref 70–99)

## 2018-02-16 LAB — BASIC METABOLIC PANEL
Anion gap: 8 (ref 5–15)
BUN: 40 mg/dL — AB (ref 8–23)
CO2: 27 mmol/L (ref 22–32)
CREATININE: 0.97 mg/dL (ref 0.44–1.00)
Calcium: 8.3 mg/dL — ABNORMAL LOW (ref 8.9–10.3)
Chloride: 108 mmol/L (ref 98–111)
GFR calc Af Amer: >60 mL/min (ref >60)
GFR calc non Af Amer: 53 mL/min — ABNORMAL LOW (ref >60)
Glucose, Bld: 163 mg/dL — ABNORMAL HIGH (ref 70–99)
Potassium: 4 mmol/L (ref 3.5–5.1)
Sodium: 143 mmol/L (ref 135–145)

## 2018-02-16 LAB — MAGNESIUM: Magnesium: 2.3 mg/dL (ref 1.7–2.4)

## 2018-02-16 LAB — CBC
HCT: 31.6 % — ABNORMAL LOW (ref 36.0–46.0)
Hemoglobin: 9.9 g/dL — ABNORMAL LOW (ref 12.0–15.0)
MCH: 29.5 pg (ref 26.0–34.0)
MCHC: 31.3 g/dL (ref 30.0–36.0)
MCV: 94 fL (ref 80.0–100.0)
Platelets: 378 10*3/uL (ref 150–400)
RBC: 3.36 MIL/uL — ABNORMAL LOW (ref 3.87–5.11)
RDW: 16.6 % — AB (ref 11.5–15.5)
WBC: 15.1 10*3/uL — ABNORMAL HIGH (ref 4.0–10.5)
nRBC: 0 % (ref 0.0–0.2)

## 2018-02-16 MED ORDER — DILTIAZEM 12 MG/ML ORAL SUSPENSION
30.0000 mg | Freq: Four times a day (QID) | ORAL | Status: DC
  Administered 2018-02-16 – 2018-02-19 (×11): 30 mg
  Filled 2018-02-16 (×14): qty 3

## 2018-02-16 MED ORDER — FUROSEMIDE 10 MG/ML IJ SOLN
40.0000 mg | Freq: Once | INTRAMUSCULAR | Status: AC
  Administered 2018-02-16: 40 mg via INTRAVENOUS
  Filled 2018-02-16: qty 4

## 2018-02-16 NOTE — Progress Notes (Addendum)
.   NAMELudella Vazquez, MRN:  [id number], DOB:  1929-07-06, LOS: 20 ADMISSION DATE:  01/27/2018, CONSULTATION DATE:  1/26  REFERRING MD:  Mesner, CHIEF COMPLAINT:  Acute encephalopathy   Brief History   85 y/o female admitted on 1/26 with embolic left MCA stroke.  Outside window for TPA or intervention.  Past Medical History  HTN, CHF  Significant Hospital Events   1/26  Admit with AMS, intubated   1/28  Increasing mass effect and hemorrhagic conversion   Consults:  PCCM Neurology Palliative  Procedures:  ETT 1/26 >> 2/11  Trach 2/11 (JY) >>  PEG (IR) 2/13 >>   Significant Diagnostic Tests:  CTA Head 1/26 >> Complete occlusion of the left M1 segment due to an embolus. Focal narrowing and irregularity of the mid cervical ICA on the left with diameter of 2.6 mm. This probably represents ulcerated soft plaque and this could be a source of embolus. MRI brain 1/27 >> large ischemic left MCA stroke, no midline shift ECHO 1/27 >> EF 55-65%, moderate LVH pattern CT head 1/28 >> evolving L MCA infarct with new 58mm midline shift  Micro Data:  MRSA 1/26 >> neg Tracheal aspirate 2/14 >>   Antimicrobials:     Interim history/subjective:  Afebrile. Daughter reports increased tracheal secretions but no color change.  Indicates her mother is hurting / uncomfortable from lying in bed.   Objective   Blood pressure (!) 106/56, pulse 97, temperature 98.1 F (36.7 C), temperature source Axillary, resp. rate (!) 23, height 5\' 3"  (1.6 m), weight 76.8 kg, SpO2 100 %.    Vent Mode: CPAP;PSV FiO2 (%):  [30 %] 30 % Set Rate:  [20 bmp] 20 bmp Vt Set:  [320 mL] 320 mL PEEP:  [5 cmH20] 5 cmH20 Pressure Support:  [10 cmH20] 10 cmH20 Plateau Pressure:  [14 cmH20-19 cmH20] 15 cmH20   Intake/Output Summary (Last 24 hours) at 02/16/2018 7680 Last data filed at 02/16/2018 0600 Gross per 24 hour  Intake 1924.55 ml  Output 750 ml  Net 1174.55 ml   Filed Weights   02/13/18 0455 02/15/18 0500  02/16/18 0500  Weight: 76.3 kg 77 kg 76.8 kg   Physical Exam: General: elderly female lying in bed in NAD on vent  HEENT: MM pink/moist, #6 trach midline c/d/i, sutures in place Neuro: Awake, alert, nods occasionally / intermittently follows commands  CV: s1s2 rrr, no m/r/g PULM: even/non-labored, lungs bilaterally with scattered rhonchi  SU:PJSR, non-tender, bsx4 active  Extremities: warm/dry, 1+ edema edema, space boots in place Skin: no rashes or lesions  Resolved Hospital Problem list   Hypotension, AKI   Assessment & Plan:   Acute respiratory failure with hypoxia requiring mechanical ventilation related to inability to protect airway. Status Post Tracheostomy. P: PRVC 8cc/kg  SBT wean with goal for ATC as tolerated Pulmonary hygiene - mobilize Follow tracheal aspirate  Lasix 2/15 with KCL  Encephalopathy in setting of Acute L MCA CVA  -with midline shift - R hemiplegia, neglect and possible aphasia P: Per Neurlogy  Continue ASA + statin   Chronic dCHF  -LVEF 55-60% this admit  P: Lasix as above  Resume home diltiazem dosing per tube  Hold home metolazone   RUE Swelling  P: Await [place] of UE's   At Risk Malnutrition  P: PEG tube per IR  Continue TF   Hypernatremia Hypokalemia  P: Continue free water 400 ml BID Monitor lytes, replace as indicated  Goals of Care P: DNR SW/CM working  on LTAC placement   Best practice:  Diet: Enteral Nutrition Pain/Anxiety/Delirium protocol (if indicated): PAD RASS goal 0 to -1 VAP protocol (if indicated): Yes DVT prophylaxis: SCD, sq Heparin GI prophylaxis: Pantoprazole  Glucose control: Monitor CBG Mobility: bed rest Code Status: DNR Family Communication: Daughter updated on plan of care 2/15. Disposition: ICU  Labs   CBC: Recent Labs  Lab 02/10/18 0242 02/10/18 0432 02/11/18 0429 02/12/18 2009 02/14/18 0357  WBC 15.2*  --  12.1* 20.6* 16.9*  NEUTROABS  --   --  10.2*  --   --   HGB 10.9* 10.5* 10.8*  13.3 10.4*  HCT 35.8* 31.0* 35.5* 44.9 32.8*  MCV 93.0  --  92.7 94.7 92.7  PLT 329  --  319 410* 323    Basic Metabolic Panel: Recent Labs  Lab 02/10/18 0242  02/11/18 0429 02/12/18 0541 02/13/18 0450 02/14/18 0357 02/15/18 0320  NA 146*   < > 145 145 142 144 143  K 3.4*   < > 3.4* 4.4 4.1 3.9 3.4*  CL 108  --  110 108 106 110 109  CO2 28  --  28 26 25 26 27   GLUCOSE 169*  --  162* 122* 197* 130* 182*  BUN 48*  --  47* 43* 47* 42* 40*  CREATININE 0.91  --  0.89 0.93 1.02* 0.88 0.94  CALCIUM 8.2*  --  8.0* 8.3* 8.3* 8.1* 8.3*  MG 2.5*  --  2.4  --   --  2.4 2.3  PHOS 3.5  --  3.4  --   --  3.8 4.0   < > = values in this interval not displayed.   GFR: Estimated Creatinine Clearance: 42.2 mL/min (by C-G formula based on SCr of 0.94 mg/dL). Recent Labs  Lab 02/10/18 0242 02/11/18 0429 02/12/18 2009 02/14/18 0357  WBC 15.2* 12.1* 20.6* 16.9*    Liver Function Tests: No results for input(s): AST, ALT, ALKPHOS, BILITOT, PROT, ALBUMIN in the last 168 hours. No results for input(s): LIPASE, AMYLASE in the last 168 hours. No results for input(s): AMMONIA in the last 168 hours.  ABG    Component Value Date/Time   PHART 7.514 (H) 02/11/2018 0236   PCO2ART 36.3 02/11/2018 0236   PO2ART 95.3 02/11/2018 0236   HCO3 29.1 (H) 02/11/2018 0236   TCO2 32 02/10/2018 0432   O2SAT 97.7 02/11/2018 0236     Coagulation Profile: Recent Labs  Lab 02/11/18 0429  INR 1.11    Cardiac Enzymes: No results for input(s): CKTOTAL, CKMB, CKMBINDEX, TROPONINI in the last 168 hours.  HbA1C: Hgb A1c MFr Bld  Date/Time Value Ref Range Status  01/28/2018 06:39 AM 5.5 4.8 - 5.6 % Final    Comment:    (NOTE) Pre diabetes:          5.7%-6.4% Diabetes:              >6.4% Glycemic control for   <7.0% adults with diabetes     CBG: Recent Labs  Lab 02/15/18 1142 02/15/18 1540 02/15/18 1911 02/15/18 2336 02/16/18 0318  GLUCAP 148* 137* 134* 139* 139*    Canary Brim,  NP-C [facility] Pgr: [phone] or if no answer [phone] 02/16/2018, 8:08 AM

## 2018-02-16 NOTE — Progress Notes (Signed)
Right upper extremity venous duplex exam completed. Please see preliminary notes on CV PROC under chart review. Kitrina Maurin H Thai Hemrick(RDMS RVT) 02/16/18

## 2018-02-17 ENCOUNTER — Encounter (HOSPITAL_COMMUNITY): Payer: Self-pay | Admitting: Certified Registered Nurse Anesthetist

## 2018-02-17 ENCOUNTER — Other Ambulatory Visit: Payer: Self-pay

## 2018-02-17 LAB — GLUCOSE, CAPILLARY
Glucose-Capillary: 178 mg/dL — ABNORMAL HIGH (ref 70–99)
Glucose-Capillary: 164 mg/dL — ABNORMAL HIGH (ref 70–99)
Glucose-Capillary: 169 mg/dL — ABNORMAL HIGH (ref 70–99)
Glucose-Capillary: 120 mg/dL — ABNORMAL HIGH (ref 70–99)
Glucose-Capillary: 127 mg/dL — ABNORMAL HIGH (ref 70–99)
Glucose-Capillary: 143 mg/dL — ABNORMAL HIGH (ref 70–99)

## 2018-02-17 LAB — CBC
HCT: 37.5 % (ref 36.0–46.0)
Hemoglobin: 11.1 g/dL — ABNORMAL LOW (ref 12.0–15.0)
MCH: 28.1 pg (ref 26.0–34.0)
MCHC: 29.6 g/dL — ABNORMAL LOW (ref 30.0–36.0)
MCV: 94.9 fL (ref 80.0–100.0)
Platelets: 371 10*3/uL (ref 150–400)
RBC: 3.95 MIL/uL (ref 3.87–5.11)
RDW: 16.3 % — AB (ref 11.5–15.5)
WBC: 16 10*3/uL — ABNORMAL HIGH (ref 4.0–10.5)
nRBC: 0 % (ref 0.0–0.2)

## 2018-02-17 LAB — CULTURE, RESPIRATORY W GRAM STAIN
Culture: NORMAL
Gram Stain: NONE SEEN

## 2018-02-17 LAB — MAGNESIUM: Magnesium: 2.3 mg/dL (ref 1.7–2.4)

## 2018-02-17 MED ORDER — ENOXAPARIN SODIUM 40 MG/0.4ML ~~LOC~~ SOLN
40.0000 mg | SUBCUTANEOUS | Status: DC
  Administered 2018-02-17: 40 mg via SUBCUTANEOUS
  Filled 2018-02-17: qty 0.4

## 2018-02-17 MED ORDER — FUROSEMIDE 10 MG/ML IJ SOLN
20.0000 mg | Freq: Every day | INTRAMUSCULAR | Status: DC
  Administered 2018-02-17 – 2018-02-18 (×2): 20 mg via INTRAVENOUS
  Filled 2018-02-17 (×2): qty 2

## 2018-02-17 MED ORDER — INSULIN ASPART 100 UNIT/ML ~~LOC~~ SOLN
1.0000 [IU] | SUBCUTANEOUS | Status: DC
  Administered 2018-02-17 (×2): 1 [IU] via SUBCUTANEOUS
  Administered 2018-02-17: 2 [IU] via SUBCUTANEOUS
  Administered 2018-02-18 – 2018-02-19 (×2): 1 [IU] via SUBCUTANEOUS

## 2018-02-17 NOTE — Plan of Care (Signed)
Pt tolerated weaning mode on ventilator for almost 4 hours today

## 2018-02-17 NOTE — Progress Notes (Signed)
SLP Cancellation Note  Patient Details Name: Kelli Vazquez MRN: [id number] DOB: 11-28-1929   Cancelled treatment:       Reason Eval/Treat Not Completed: Medical issues which prohibited therapy. Pt remains on ventilator. Will follow up when medically ready.   Rondel Baton, [place], CCC-SLP Speech-Language Pathologist Acute Rehabilitation Services Pager: [phone] Office: [phone]    [name] 02/17/2018, 11:07 AM

## 2018-02-17 NOTE — Progress Notes (Addendum)
.   NAMEMaureen Vazquez, MRN:  [id number], DOB:  Dec 14, 1929, LOS: 21 ADMISSION DATE:  01/27/2018, CONSULTATION DATE:  1/26  REFERRING MD:  Mesner, CHIEF COMPLAINT:  Acute encephalopathy   Brief History   85 y/o female admitted on 1/26 with embolic left MCA stroke.  Outside window for TPA or intervention.  Past Medical History  HTN, CHF  Significant Hospital Events   1/26  Admit with AMS, intubated   1/28  Increasing mass effect and hemorrhagic conversion   Consults:  PCCM Neurology Palliative  Procedures:  ETT 1/26 >> 2/11  Trach 2/11 (JY) >>  PEG (IR) 2/13 >>   Significant Diagnostic Tests:  CTA Head 1/26 >> Complete occlusion of the left M1 segment due to an embolus. Focal narrowing and irregularity of the mid cervical ICA on the left with diameter of 2.6 mm. This probably represents ulcerated soft plaque and this could be a source of embolus. MRI brain 1/27 >> large ischemic left MCA stroke, no midline shift ECHO 1/27 >> EF 55-65%, moderate LVH pattern CT head 1/28 >> evolving L MCA infarct with new 101mm midline shift UE Venous Duplex 2/15 >> negative for DVT   Micro Data:  MRSA 1/26 >> neg Tracheal aspirate 2/14 >>   Antimicrobials:     Interim history/subjective:  Pt following commands (when asked in spanish).  Remains on vent.  SBT not attempted yet this am.  Afebrile.  RN reports diarrhea on TF with miralax.  Miralax held.  1.1L UOP with lasix, remains 12L positive for admit.   Objective   Blood pressure 129/77, pulse 96, temperature 98.8 F (37.1 C), temperature source Oral, resp. rate (!) 24, height 5\' 3"  (1.6 m), weight 76.8 kg, SpO2 99 %.    Vent Mode: PRVC FiO2 (%):  [30 %] 30 % Set Rate:  [16 bmp-20 bmp] 16 bmp Vt Set:  [320 mL] 320 mL PEEP:  [5 cmH20] 5 cmH20 Plateau Pressure:  [13 cmH20-20 cmH20] 16 cmH20   Intake/Output Summary (Last 24 hours) at 02/17/2018 1856 Last data filed at 02/17/2018 0600 Gross per 24 hour  Intake 1499.86 ml  Output 1100 ml   Net 399.86 ml   Filed Weights   02/13/18 0455 02/15/18 0500 02/16/18 0500  Weight: 76.3 kg 77 kg 76.8 kg   Physical Exam: General: elderly female lying in bed on vent in NAD  HEENT: MM pink/moist, trach midline clean/dry, sutures intact Neuro: awake, follows simple commands on L CV: s1s2 rrr, no m/r/g PULM: even/non-labored, lungs bilaterally clear  DJ:SHFW, non-tender, bsx4 active  Extremities: warm/dry, trace generalized edema, space boots on BLE   Skin: no rashes or lesions  Resolved Hospital Problem list   Hypotension, AKI   Assessment & Plan:   Acute respiratory failure with hypoxia requiring mechanical ventilation related to inability to protect airway. Status Post Tracheostomy. P: Continue PRVC as rest mode  Daily SBT attempts with goal for ATC  Mobilize, sit upright  Follow tracheal aspirate   Encephalopathy in setting of Acute L MCA CVA  -with midline shift - R hemiplegia, neglect and possible aphasia P: Recommendations per Neurology  ASA + statin  Chronic dCHF  -LVEF 55-60% this admit  P: Hold lasix 2/16, consider restart metolazone 2/17 Continue home diltiazem  Hold home metolazone   RUE Swelling  P: Negative for DVT on preliminary  At Risk Malnutrition  P: TF per nutrition  Plan for PEG tube on 2/17  Diarrhea  -suspect in setting of TF +  bowel regimen  P: Monitor fever curve / WBC trend  Consider infectious assessment if spikes fever  Hypernatremia Hypokalemia  P: Free water 400 ml BID  Follow lytes   Goals of Care P: DNR  Working toward [employer] placement   Best practice:  Diet: Enteral Nutrition Pain/Anxiety/Delirium protocol (if indicated): RASS goal 0  VAP protocol (if indicated): Yes DVT prophylaxis: SCD, sq Heparin GI prophylaxis: Pantoprazole  Glucose control: CBG Mobility: bed rest Code Status: DNR Family Communication: Daughter updated on plan of care 2/15.  No family available am 2/16.  Disposition: ICU  Labs    CBC: Recent Labs  Lab 02/11/18 0429 02/12/18 2009 02/14/18 0357 02/16/18 0707 02/17/18 0431  WBC 12.1* 20.6* 16.9* 15.1* 16.0*  NEUTROABS 10.2*  --   --   --   --   HGB 10.8* 13.3 10.4* 9.9* 11.1*  HCT 35.5* 44.9 32.8* 31.6* 37.5  MCV 92.7 94.7 92.7 94.0 94.9  PLT 319 410* 323 378 371    Basic Metabolic Panel: Recent Labs  Lab 02/11/18 0429 02/12/18 0541 02/13/18 0450 02/14/18 0357 02/15/18 0320 02/16/18 0707 02/17/18 0431  NA 145 145 142 144 143 143  --   K 3.4* 4.4 4.1 3.9 3.4* 4.0  --   CL 110 108 106 110 109 108  --   CO2 28 26 25 26 27 27   --   GLUCOSE 162* 122* 197* 130* 182* 163*  --   BUN 47* 43* 47* 42* 40* 40*  --   CREATININE 0.89 0.93 1.02* 0.88 0.94 0.97  --   CALCIUM 8.0* 8.3* 8.3* 8.1* 8.3* 8.3*  --   MG 2.4  --   --  2.4 2.3 2.3 2.3  PHOS 3.4  --   --  3.8 4.0  --   --    GFR: Estimated Creatinine Clearance: 40.9 mL/min (by C-G formula based on SCr of 0.97 mg/dL). Recent Labs  Lab 02/12/18 2009 02/14/18 0357 02/16/18 0707 02/17/18 0431  WBC 20.6* 16.9* 15.1* 16.0*    Liver Function Tests: No results for input(s): AST, ALT, ALKPHOS, BILITOT, PROT, ALBUMIN in the last 168 hours. No results for input(s): LIPASE, AMYLASE in the last 168 hours. No results for input(s): AMMONIA in the last 168 hours.  ABG    Component Value Date/Time   PHART 7.514 (H) 02/11/2018 0236   PCO2ART 36.3 02/11/2018 0236   PO2ART 95.3 02/11/2018 0236   HCO3 29.1 (H) 02/11/2018 0236   TCO2 32 02/10/2018 0432   O2SAT 97.7 02/11/2018 0236     Coagulation Profile: Recent Labs  Lab 02/11/18 0429  INR 1.11    Cardiac Enzymes: No results for input(s): CKTOTAL, CKMB, CKMBINDEX, TROPONINI in the last 168 hours.  HbA1C: Hgb A1c MFr Bld  Date/Time Value Ref Range Status  01/28/2018 06:39 AM 5.5 4.8 - 5.6 % Final    Comment:    (NOTE) Pre diabetes:          5.7%-6.4% Diabetes:              >6.4% Glycemic control for   <7.0% adults with diabetes      CBG: Recent Labs  Lab 02/16/18 1537 02/16/18 1927 02/16/18 2337 02/17/18 0313 02/17/18 0728  GLUCAP 125* 151* 142* 178* 164*    Canary Brim, NP-C [facility] Pgr: [phone] or if no answer [phone] 02/17/2018, 8:11 AM

## 2018-02-18 ENCOUNTER — Inpatient Hospital Stay (HOSPITAL_COMMUNITY): Payer: Medicare Other

## 2018-02-18 ENCOUNTER — Encounter (HOSPITAL_COMMUNITY): Payer: Self-pay | Admitting: Diagnostic Radiology

## 2018-02-18 HISTORY — PX: IR GASTROSTOMY TUBE MOD SED: IMG625

## 2018-02-18 LAB — GLUCOSE, CAPILLARY
Glucose-Capillary: 102 mg/dL — ABNORMAL HIGH (ref 70–99)
Glucose-Capillary: 103 mg/dL — ABNORMAL HIGH (ref 70–99)
Glucose-Capillary: 105 mg/dL — ABNORMAL HIGH (ref 70–99)
Glucose-Capillary: 107 mg/dL — ABNORMAL HIGH (ref 70–99)
Glucose-Capillary: 108 mg/dL — ABNORMAL HIGH (ref 70–99)
Glucose-Capillary: 127 mg/dL — ABNORMAL HIGH (ref 70–99)
Glucose-Capillary: 156 mg/dL — ABNORMAL HIGH (ref 70–99)

## 2018-02-18 LAB — CBC
HEMATOCRIT: 33.4 % — AB (ref 36.0–46.0)
Hemoglobin: 10 g/dL — ABNORMAL LOW (ref 12.0–15.0)
MCH: 28.4 pg (ref 26.0–34.0)
MCHC: 29.9 g/dL — ABNORMAL LOW (ref 30.0–36.0)
MCV: 94.9 fL (ref 80.0–100.0)
Platelets: 405 10*3/uL — ABNORMAL HIGH (ref 150–400)
RBC: 3.52 MIL/uL — ABNORMAL LOW (ref 3.87–5.11)
RDW: 16.2 % — ABNORMAL HIGH (ref 11.5–15.5)
WBC: 13.2 10*3/uL — ABNORMAL HIGH (ref 4.0–10.5)
nRBC: 0 % (ref 0.0–0.2)

## 2018-02-18 LAB — BASIC METABOLIC PANEL WITH GFR
Anion gap: 11 (ref 5–15)
BUN: 33 mg/dL — ABNORMAL HIGH (ref 8–23)
CO2: 30 mmol/L (ref 22–32)
Calcium: 8.4 mg/dL — ABNORMAL LOW (ref 8.9–10.3)
Chloride: 104 mmol/L (ref 98–111)
Creatinine, Ser: 0.81 mg/dL (ref 0.44–1.00)
GFR calc Af Amer: >60 mL/min
GFR calc non Af Amer: >60 mL/min
Glucose, Bld: 120 mg/dL — ABNORMAL HIGH (ref 70–99)
Potassium: 3.6 mmol/L (ref 3.5–5.1)
Sodium: 145 mmol/L (ref 135–145)

## 2018-02-18 LAB — MAGNESIUM: Magnesium: 2.3 mg/dL (ref 1.7–2.4)

## 2018-02-18 MED ORDER — ENOXAPARIN SODIUM 40 MG/0.4ML ~~LOC~~ SOLN
40.0000 mg | SUBCUTANEOUS | Status: DC
  Administered 2018-02-19: 40 mg via SUBCUTANEOUS
  Filled 2018-02-18: qty 0.4

## 2018-02-18 MED ORDER — LIDOCAINE HCL 1 % IJ SOLN
INTRAMUSCULAR | Status: AC
  Filled 2018-02-18: qty 20

## 2018-02-18 MED ORDER — MIDAZOLAM HCL 2 MG/2ML IJ SOLN
INTRAMUSCULAR | Status: DC | PRN
  Administered 2018-02-18: 1 mg via INTRAVENOUS

## 2018-02-18 MED ORDER — MIDAZOLAM HCL 2 MG/2ML IJ SOLN
INTRAMUSCULAR | Status: AC
  Filled 2018-02-18: qty 4

## 2018-02-18 MED ORDER — FENTANYL CITRATE (PF) 100 MCG/2ML IJ SOLN
INTRAMUSCULAR | Status: AC
  Filled 2018-02-18: qty 4

## 2018-02-18 MED ORDER — GLUCAGON HCL RDNA (DIAGNOSTIC) 1 MG IJ SOLR
INTRAMUSCULAR | Status: AC
  Filled 2018-02-18: qty 1

## 2018-02-18 MED ORDER — IOPAMIDOL (ISOVUE-300) INJECTION 61%
INTRAVENOUS | Status: AC
  Administered 2018-02-18: 20 mL
  Filled 2018-02-18: qty 50

## 2018-02-18 MED ORDER — VANCOMYCIN HCL IN DEXTROSE 1-5 GM/200ML-% IV SOLN
INTRAVENOUS | Status: AC
  Administered 2018-02-18: 1000 mg
  Filled 2018-02-18: qty 200

## 2018-02-18 NOTE — Progress Notes (Signed)
Physical Therapy Treatment Patient Details Name: Kelli Vazquez MRN: [id number] DOB: [birth year] Today's Date: 02/18/2018    History of Present Illness 85 y/o female admitted on 1/26 with embolic left MCA stroke.  Outside window for TPA or intervention.  VDRF 1/26-present.  past medical history of hypertension, congestive heart failure, atrial fib, history of CVA     PT Comments    Pt alert and actively participated in PT today. Pt using L UE voluntarily to assist with transfers and proping self up in sitting EOB. Pt remains unable to maintain EOB sitting balance without maxA. Pt with dense R hemiplegia,  50% command follow to simple commands, and delayed processing. Pt remains on the vent with plans to go for PEG tube today. Acute PT to cont to follow.   Follow Up Recommendations  LTACH;Supervision/Assistance - 24 hour     Equipment Recommendations       Recommendations for Other Services       Precautions / Restrictions Precautions Precautions: Fall Restrictions Weight Bearing Restrictions: No    Mobility  Bed Mobility Overal bed mobility: Needs Assistance Bed Mobility: Rolling;Sidelying to Sit;Sit to Supine Rolling: Max assist;+2 for physical assistance Sidelying to sit: +2 for physical assistance;Max assist   Sit to supine: Total assist;+2 for physical assistance   General bed mobility comments: pt attempted to use L UE to assist with rolling and transfer, maxA for LE management and trunk elevation to achieve sittting EOB and to return to supine  Transfers                 General transfer comment: pt leaving for PEG tube placement  Ambulation/Gait                 Stairs             Wheelchair Mobility    Modified Rankin (Stroke Patients Only) Modified Rankin (Stroke Patients Only) Pre-Morbid Rankin Score: No symptoms Modified Rankin: Severe disability     Balance Overall balance assessment: Needs assistance Sitting-balance support: Feet  supported;Single extremity supported Sitting balance-Leahy Scale: Fair Sitting balance - Comments: pt used L UE to attempt to prop self up however unable to maintain midline, upright position > 10 sec, pt with inability to pull self forward                                    Cognition Arousal/Alertness: Awake/alert Behavior During Therapy: Flat affect Overall Cognitive Status: Impaired/Different from baseline Area of Impairment: Following commands;Awareness;Problem solving;Attention                   Current Attention Level: Sustained   Following Commands: Follows one step commands with increased time;Follows one step commands inconsistently(50% of time)   Awareness: Intellectual Problem Solving: Slow processing;Difficulty sequencing;Requires verbal cues;Requires tactile cues General Comments: pt opened eyes upon sitting EOB and maintained eyes open, pt attempted to smile multiple times, pt responsive appropriately to PT. Pt with approx simple command follow 50% of time      Exercises      General Comments General comments (skin integrity, edema, etc.): VSS      Pertinent Vitals/Pain Pain Assessment: No/denies pain    Home Living                      Prior Function            PT Goals (current  goals can now be found in the care plan section) Progress towards PT goals: Progressing toward goals    Frequency    Min 3X/week      PT Plan Current plan remains appropriate    Co-evaluation              AM-PAC PT "6 Clicks" Mobility   Outcome Measure  Help needed turning from your back to your side while in a flat bed without using bedrails?: Total Help needed moving from lying on your back to sitting on the side of a flat bed without using bedrails?: Total Help needed moving to and from a bed to a chair (including a wheelchair)?: Total Help needed standing up from a chair using your arms (e.g., wheelchair or bedside chair)?:  Total Help needed to walk in hospital room?: Total Help needed climbing 3-5 steps with a railing? : Total 6 Click Score: 6    End of Session Equipment Utilized During Treatment: Oxygen(on vent) Activity Tolerance: Patient tolerated treatment well Patient left: in bed;with call bell/phone within reach;with bed alarm set;with family/visitor present Nurse Communication: Mobility status PT Visit Diagnosis: Muscle weakness (generalized) (M62.81);Hemiplegia and hemiparesis Hemiplegia - Right/Left: Right Hemiplegia - dominant/non-dominant: Dominant     Time: 6734-1937 PT Time Calculation (min) (ACUTE ONLY): 20 min  Charges:  $Therapeutic Activity: 8-22 mins                    Lewis Shock, PT, DPT Acute Rehabilitation Services Pager #: [phone] Office #: [phone]    Iona Hansen 02/18/2018, 12:09 PM

## 2018-02-18 NOTE — Progress Notes (Signed)
NAME:  Kelli Vazquez, MRN:  [id number], DOB:  [birth year], LOS: 22 ADMISSION DATE:  01/27/2018, CONSULTATION DATE: 01/27/2018 REFERRING MD: Mickeal Skinner, ED, CHIEF COMPLAINT: Acute encephalopathy  HPI/course in hospital  85 year old woman admitted outside of intervention window with left MCA embolic stroke  Ventilator dependent respiratory failure since then. 1/28 increasing mass-effect and hemorrhagic conversion noted  Plan was initially for one-way extubation at 2-week mark but family decided for tracheostomy and PEG tube.  She has been unable to wean from ventilator.  Past Medical History  History reviewed. No pertinent past medical history.  History reviewed. No pertinent surgical history.   Interim history/subjective:  For PEG today. Improved secretions clearance with chest PT.  Objective   Blood pressure 122/80, pulse 94, temperature 97.7 F (36.5 C), temperature source Axillary, resp. rate 15, height 5\' 3"  (1.6 m), weight 76.8 kg, SpO2 99 %.    Vent Mode: PRVC FiO2 (%):  [30 %] 30 % Set Rate:  [16 bmp] 16 bmp Vt Set:  [320 mL] 320 mL PEEP:  [5 cmH20] 5 cmH20 Pressure Support:  [10 cmH20] 10 cmH20 Plateau Pressure:  [13 cmH20-16 cmH20] 15 cmH20   Intake/Output Summary (Last 24 hours) at 02/18/2018 0912 Last data filed at 02/18/2018 0900 Gross per 24 hour  Intake 839.37 ml  Output 1000 ml  Net -160.63 ml   Filed Weights   02/13/18 0455 02/15/18 0500 02/16/18 0500  Weight: 76.3 kg 77 kg 76.8 kg    Examination: Physical Exam  Constitutional:  Appears frail  HENT:  Tracheostomy tube in place  Eyes: Pupils are equal, round, and reactive to light.  Cardiovascular: Normal rate.  Respiratory: Effort normal and breath sounds normal.  No ventilator asynchrony  GI: Soft. Bowel sounds are normal.  Musculoskeletal:        General: Edema (In both arms, right greater than left) present.  Neurological: She is alert.  Right hemiparesis     Ancillary tests  (personally reviewed)  CBC: Recent Labs  Lab 02/12/18 2009 02/14/18 0357 02/16/18 0707 02/17/18 0431  WBC 20.6* 16.9* 15.1* 16.0*  HGB 13.3 10.4* 9.9* 11.1*  HCT 44.9 32.8* 31.6* 37.5  MCV 94.7 92.7 94.0 94.9  PLT 410* 323 378 371    Basic Metabolic Panel: Recent Labs  Lab 02/12/18 0541 02/13/18 0450 02/14/18 0357 02/15/18 0320 02/16/18 0707 02/17/18 0431  NA 145 142 144 143 143  --   K 4.4 4.1 3.9 3.4* 4.0  --   CL 108 106 110 109 108  --   CO2 26 25 26 27 27   --   GLUCOSE 122* 197* 130* 182* 163*  --   BUN 43* 47* 42* 40* 40*  --   CREATININE 0.93 1.02* 0.88 0.94 0.97  --   CALCIUM 8.3* 8.3* 8.1* 8.3* 8.3*  --   MG  --   --  2.4 2.3 2.3 2.3  PHOS  --   --  3.8 4.0  --   --    GFR: Estimated Creatinine Clearance: 40.9 mL/min (by C-G formula based on SCr of 0.97 mg/dL). Recent Labs  Lab 02/12/18 2009 02/14/18 0357 02/16/18 0707 02/17/18 0431  WBC 20.6* 16.9* 15.1* 16.0*    Liver Function Tests: No results for input(s): AST, ALT, ALKPHOS, BILITOT, PROT, ALBUMIN in the last 168 hours. No results for input(s): LIPASE, AMYLASE in the last 168 hours. No results for input(s): AMMONIA in the last 168 hours.  ABG    Component Value Date/Time   PHART  7.514 (H) 02/11/2018 0236   PCO2ART 36.3 02/11/2018 0236   PO2ART 95.3 02/11/2018 0236   HCO3 29.1 (H) 02/11/2018 0236   TCO2 32 02/10/2018 0432   O2SAT 97.7 02/11/2018 0236     Coagulation Profile: No results for input(s): INR, PROTIME in the last 168 hours.  Cardiac Enzymes: No results for input(s): CKTOTAL, CKMB, CKMBINDEX, TROPONINI in the last 168 hours.  HbA1C: Hgb A1c MFr Bld  Date/Time Value Ref Range Status  01/28/2018 06:39 AM 5.5 4.8 - 5.6 % Final    Comment:    (NOTE) Pre diabetes:          5.7%-6.4% Diabetes:              >6.4% Glycemic control for   <7.0% adults with diabetes     CBG: Recent Labs  Lab 02/17/18 1551 02/17/18 1920 02/17/18 2323 02/18/18 0316 02/18/18 0748  GLUCAP  120* 127* 143* 103* 108*   CXR: Small effusion at right base otherwise unchanged (personal interpretation).  Assessment & Plan:  Remains critically ill due to respiratory failure requiring mechanical ventilation. Has not tolerated SBT due to tachypnea Status post acute left MCA CVA with stable deficits at this time History of HFpEF on chronic diuretics. Atrial Fibrillation - on diltiazem   PLAN:  Continue secondary stroke prevention Continue baseline diuretics Chest physiotherapy Daily SBT with trach collar trials once tolerating PSV 10/5 for 2 hours. For PEG today  Best practice:  Diet: Tube feed Pain/Anxiety/Delirium protocol (if indicated): PRN fentanyl only VAP protocol (if indicated): Bundle in place DVT prophylaxis: Lovenox GI prophylaxis: Pantoprazole Glucose control: Phase 1 Mobility: PT OT Code Status: DNR if cardiac arrest Family Communication: Daughter updated at bedside Disposition: ICU   Critical care time: 35 min including chart data review, examination of patient, multidisciplinary rounds, and frequent assessment and modification of ventilator therapy.     Lynnell Catalan, MD [facility] ICU Physician [facility]  Pager: [phone] Mobile: [phone] After hours: [phone].  02/18/2018, 9:12 AM

## 2018-02-18 NOTE — Progress Notes (Signed)
CPT held at this time d/t PEG tube placed less than 30 minutes ago. Received pt already in IR via unit RT.  PT transported back to ICU w/ no apparent complications.

## 2018-02-18 NOTE — Progress Notes (Addendum)
Pre-aldrete score: 10/10. Unable to chart in patient's chart during sedation.  Patient given 1 mg of Versed and 50 mcg of Fentanyl @ 1410.  Patient given 1 mg of Glucagon at 1411. Patient received 1 gm of Vancomycin in interventional radiology. Aldrete score 10/10 upon leaving the interventional radiology.  Procedure tolerated well. Patient transported to unit with this RN and respiratory. Report given to Maralyn Sago, RN at the bedside. Family at the bedside.

## 2018-02-18 NOTE — Progress Notes (Signed)
CPT held at this time per family request due to patient recent surgery.

## 2018-02-18 NOTE — Procedures (Signed)
Interventional Radiology Procedure:   Indications: CVA and dysphagia  Procedure: Gastrostomy tube placement  Findings: 20 Fr tube in stomach  Complications: None     EBL: Minimal   Plan: Plan to use for feeds tomorrow, 2/18.    Emilene Roma R. Lowella Dandy, MD  Pager: [phone]

## 2018-02-19 ENCOUNTER — Other Ambulatory Visit (HOSPITAL_COMMUNITY): Payer: Self-pay

## 2018-02-19 ENCOUNTER — Inpatient Hospital Stay
Admission: AD | Admit: 2018-02-19 | Discharge: 2018-03-25 | Disposition: A | Discharge status: Skilled Nursing Facility | Payer: Medicare Other | Source: Other Acute Inpatient Hospital | Attending: Internal Medicine | Admitting: Internal Medicine

## 2018-02-19 DIAGNOSIS — J0410 Acute tracheitis without obstruction: Secondary | ICD-10-CM

## 2018-02-19 DIAGNOSIS — I63512 Cerebral infarction due to unspecified occlusion or stenosis of left middle cerebral artery: Secondary | ICD-10-CM | POA: Diagnosis present

## 2018-02-19 DIAGNOSIS — I5033 Acute on chronic diastolic (congestive) heart failure: Secondary | ICD-10-CM

## 2018-02-19 DIAGNOSIS — R945 Abnormal results of liver function studies: Secondary | ICD-10-CM

## 2018-02-19 DIAGNOSIS — J9601 Acute respiratory failure with hypoxia: Secondary | ICD-10-CM | POA: Diagnosis present

## 2018-02-19 DIAGNOSIS — I482 Chronic atrial fibrillation, unspecified: Secondary | ICD-10-CM

## 2018-02-19 DIAGNOSIS — Z931 Gastrostomy status: Secondary | ICD-10-CM

## 2018-02-19 DIAGNOSIS — Z930 Tracheostomy status: Secondary | ICD-10-CM

## 2018-02-19 DIAGNOSIS — J189 Pneumonia, unspecified organism: Secondary | ICD-10-CM

## 2018-02-19 DIAGNOSIS — R7989 Other specified abnormal findings of blood chemistry: Secondary | ICD-10-CM

## 2018-02-19 DIAGNOSIS — R0603 Acute respiratory distress: Secondary | ICD-10-CM

## 2018-02-19 LAB — GLUCOSE, CAPILLARY
Glucose-Capillary: 116 mg/dL — ABNORMAL HIGH (ref 70–99)
Glucose-Capillary: 107 mg/dL — ABNORMAL HIGH (ref 70–99)
Glucose-Capillary: 130 mg/dL — ABNORMAL HIGH (ref 70–99)
Glucose-Capillary: 85 mg/dL (ref 70–99)

## 2018-02-19 LAB — CBC
HCT: 30.6 % — ABNORMAL LOW (ref 36.0–46.0)
Hemoglobin: 9.6 g/dL — ABNORMAL LOW (ref 12.0–15.0)
MCH: 29 pg (ref 26.0–34.0)
MCHC: 31.4 g/dL (ref 30.0–36.0)
MCV: 92.4 fL (ref 80.0–100.0)
Platelets: 359 10*3/uL (ref 150–400)
RBC: 3.31 MIL/uL — ABNORMAL LOW (ref 3.87–5.11)
RDW: 16.1 % — ABNORMAL HIGH (ref 11.5–15.5)
WBC: 12.5 10*3/uL — ABNORMAL HIGH (ref 4.0–10.5)
nRBC: 0 % (ref 0.0–0.2)

## 2018-02-19 LAB — MAGNESIUM: Magnesium: 2.3 mg/dL (ref 1.7–2.4)

## 2018-02-19 MED ORDER — TORSEMIDE 20 MG PO TABS
40.0000 mg | ORAL_TABLET | Freq: Every day | ORAL | Status: DC
  Administered 2018-02-19: 40 mg
  Filled 2018-02-19: qty 2

## 2018-02-19 MED ORDER — CHLORHEXIDINE GLUCONATE 0.12% ORAL RINSE (MEDLINE KIT): 15.0000 mL | Freq: Two times a day (BID) | OROMUCOSAL | 0 refills | Status: DC

## 2018-02-19 MED ORDER — ACETAMINOPHEN 325 MG PO TABS: 650.0000 mg | ORAL_TABLET | ORAL | Status: DC | PRN

## 2018-02-19 MED ORDER — DOCUSATE SODIUM 50 MG/5ML PO LIQD: 100.0000 mg | Freq: Two times a day (BID) | ORAL | 0 refills | Status: DC

## 2018-02-19 MED ORDER — ATORVASTATIN CALCIUM 20 MG PO TABS: 20.0000 mg | ORAL_TABLET | Freq: Every day | ORAL | Status: DC

## 2018-02-19 MED ORDER — FREE WATER: 400.0000 mL | Freq: Two times a day (BID) | Status: AC

## 2018-02-19 MED ORDER — ENOXAPARIN SODIUM 40 MG/0.4ML ~~LOC~~ SOLN: 40.0000 mg | SUBCUTANEOUS | Status: DC

## 2018-02-19 MED ORDER — POLYETHYLENE GLYCOL 3350 17 G PO PACK: 17.0000 g | PACK | Freq: Two times a day (BID) | ORAL | 0 refills | Status: AC

## 2018-02-19 MED ORDER — DILTIAZEM 12 MG/ML ORAL SUSPENSION: 30.0000 mg | Freq: Four times a day (QID) | ORAL | Status: DC

## 2018-02-19 MED ORDER — IOPAMIDOL (ISOVUE-300) INJECTION 61%
INTRAVENOUS | Status: AC
  Administered 2018-02-19: 50 mL via GASTROSTOMY
  Filled 2018-02-19: qty 50

## 2018-02-19 MED ORDER — PRO-STAT SUGAR FREE PO LIQD: 30.0000 mL | Freq: Two times a day (BID) | ORAL | 0 refills | Status: AC

## 2018-02-19 MED ORDER — PANTOPRAZOLE SODIUM 40 MG PO PACK: 40.0000 mg | PACK | Freq: Every day | ORAL | Status: AC

## 2018-02-19 MED ORDER — TORSEMIDE 20 MG PO TABS: 40.0000 mg | ORAL_TABLET | Freq: Every day | ORAL | Status: AC

## 2018-02-19 MED ORDER — INSULIN ASPART 100 UNIT/ML ~~LOC~~ SOLN: 1.0000 [IU] | SUBCUTANEOUS | 11 refills | Status: AC

## 2018-02-19 MED ORDER — OSMOLITE 1.2 CAL PO LIQD: 1000.0000 mL | ORAL | 0 refills | Status: AC

## 2018-02-19 MED ORDER — TORSEMIDE 20 MG PO TABS: 40.0000 mg | ORAL_TABLET | Freq: Every day | ORAL | Status: DC

## 2018-02-19 MED ORDER — ASPIRIN 81 MG PO CHEW: 81.0000 mg | CHEWABLE_TABLET | Freq: Every day | ORAL | Status: AC

## 2018-02-19 MED ORDER — BISACODYL 10 MG RE SUPP: 10.0000 mg | Freq: Every day | RECTAL | 0 refills | Status: DC | PRN

## 2018-02-19 MED ORDER — ORAL CARE MOUTH RINSE: 15.0000 mL | Freq: Every morning | OROMUCOSAL | 0 refills | Status: DC

## 2018-02-19 NOTE — Progress Notes (Signed)
Report given to RN on [facility]. Pt's family updated of transfer. 5E to call 4N when room 22 is clean and we are able to bring pt over. Jaelen Gellerman, [place] Scrape, RN

## 2018-02-19 NOTE — Progress Notes (Signed)
Referring Physician(s): Dr Marvis Repress  Supervising Physician: Ruel Favors  Patient Status:  [facility] - In-pt  Chief Complaint:  Dysphagia secondary to stroke  Subjective:  Patient appears comfortable in bed. Daughter at bedside.   Allergies: Latex and Penicillins  Medications: Prior to Admission medications   Medication Sig Start Date End Date Taking? Authorizing Provider  aspirin EC 81 MG tablet Take 81 mg by mouth daily.   Yes [provider]  diltiazem (DILACOR XR) 120 MG 24 hr capsule Take 120 mg by mouth daily.   Yes [provider]  meclizine (ANTIVERT) 12.5 MG tablet Take 12.5 mg by mouth 3 (three) times daily as needed for dizziness.   Yes [provider]  metolazone (ZAROXOLYN) 2.5 MG tablet Take 2.5 mg by mouth every 7 (seven) days.   Yes [provider]  potassium chloride (K-DUR,KLOR-CON) 10 MEQ tablet Take 20 mEq by mouth daily.   Yes [provider]     Vital Signs: BP 120/62   Pulse 96   Temp 98.1 F (36.7 C) (Axillary)   Resp (!) 24   Ht 5\' 3"  (1.6 m)   Wt 73.8 kg   SpO2 99%   BMI 28.82 kg/m   Physical Exam Awake She looks at me and nodded when I said good morning. G-tube in place. Site looks good Ok to use for feeds.  Imaging: Ir Gastrostomy Tube Mod Sed  Result Date: 02/18/2018 INDICATION: 85 year old with CVA and dysphagia. Plan for gastrostomy tube placement. EXAM: PERCUTANEOUS GASTROSTOMY TUBE WITH FLUOROSCOPIC GUIDANCE Physician: Rachelle Hora. Henn, MD MEDICATIONS: Vancomycin 1 g; Antibiotics were administered within 1 hour of the procedure. Glucagon 1 mg IV ANESTHESIA/SEDATION: Versed 1.0 mg IV; Fentanyl 50 mcg IV Moderate Sedation Time:  26 The patient was continuously monitored during the procedure by the interventional radiology nurse under my direct supervision. FLUOROSCOPY TIME:  Fluoroscopy Time: 3 minutes and 18 seconds, 70 mGy COMPLICATIONS: None immediate. PROCEDURE: Informed consent was  obtained for a percutaneous gastrostomy tube. The patient was placed on the interventional table. Fluoroscopy demonstrated gas in the transverse colon. An orogastric tube was placed with fluoroscopic guidance. The anterior abdomen was prepped and draped in sterile fashion. Maximal barrier sterile technique was utilized including caps, mask, sterile gowns, sterile gloves, sterile drape, hand hygiene and skin antiseptic. Stomach was inflated with air through the orogastric tube. The skin and subcutaneous tissues were anesthetized with 1% lidocaine. A 17 gauge needle was directed into the distended stomach with fluoroscopic guidance. A wire was advanced into the stomach and a T-tact was deployed. A 9-French vascular sheath was placed and the orogastric tube was snared using a Goos[place]ck snare device. The orogastric tube and snare were pulled out of the patient's mouth. The snare device was connected to a 20-French gastrostomy tube. The snare device and gastrostomy tube were pulled through the patient's mouth and out the anterior abdominal wall. The gastrostomy tube was cut to an appropriate length. Contrast injection through gastrostomy tube confirmed placement within the stomach. Fluoroscopic images were obtained for documentation. The gastrostomy tube was flushed with normal saline. Another incision was made near the gastrostomy tube site during placement. This small incision was prepped with chlorhexidine and closed using Dermabond. IMPRESSION: Successful fluoroscopic guided percutaneous gastrostomy tube placement. Electronically Signed   By: Richarda Overlie M.D.   On: 02/18/2018 19:21   Dg Chest Port 1 View  Result Date: 02/18/2018 CLINICAL DATA:  Respiratory failure. EXAM: PORTABLE CHEST 1 VIEW COMPARISON:  Radiograph of February 15, 2018. FINDINGS: Stable cardiomegaly. Tracheostomy and feeding tubes are unchanged in position. Atherosclerosis of thoracic aorta is noted. No pneumothorax is noted. Stable bibasilar  atelectasis and effusions are noted, right greater than left. Bony thorax is unremarkable. IMPRESSION: Stable support apparatus. Stable bibasilar opacities as described above. Aortic Atherosclerosis (ICD10-I70.0). Electronically Signed   By: Lupita RaiderJames  Green Jr, M.D.   On: 02/18/2018 08:12   Vas [place] Upper Extremity Venous Duplex  Result Date: 02/17/2018 UPPER VENOUS STUDY  Indications: Edema, and Swelling Limitations: Body habitus and tracheal device on site. Immobility. Comparison Study: No prior study available. Performing Technologist: Melodie BouillonSelina Cole  Examination Guidelines: A complete evaluation includes B-mode imaging, spectral Doppler, color Doppler, and power Doppler as needed of all accessible portions of each vessel. Bilateral testing is considered an integral part of a complete examination. Limited examinations for reoccurring indications may be performed as noted.  Right Findings: +----------+------------+----------+---------+-----------+-------+ RIGHT     CompressiblePropertiesPhasicitySpontaneousSummary +----------+------------+----------+---------+-----------+-------+ IJV           Full                 Yes       Yes            +----------+------------+----------+---------+-----------+-------+ Subclavian    Full                 Yes       Yes            +----------+------------+----------+---------+-----------+-------+ Axillary      Full                 Yes       Yes            +----------+------------+----------+---------+-----------+-------+ Brachial      Full                 Yes       Yes            +----------+------------+----------+---------+-----------+-------+ Radial        Full                                          +----------+------------+----------+---------+-----------+-------+ Ulnar         Full                                          +----------+------------+----------+---------+-----------+-------+ Cephalic      Full                            Yes            +----------+------------+----------+---------+-----------+-------+ Basilic       Full                 Yes       Yes            +----------+------------+----------+---------+-----------+-------+  Left Findings: +----------+------------+----------+---------+-----------+-------+ LEFT      CompressiblePropertiesPhasicitySpontaneousSummary +----------+------------+----------+---------+-----------+-------+ Subclavian    Full                 Yes       Yes            +----------+------------+----------+---------+-----------+-------+  Summary:  Right: No evidence of deep vein thrombosis in the  upper extremity. No evidence of superficial vein thrombosis in the upper extremity. No evidence of thrombosis in the subclavian.  Left: No evidence of thrombosis in the subclavian.  *See table(s) above for measurements and observations.  Diagnosing physician: Fabienne Bruns MD Electronically signed by Fabienne Bruns MD on 02/17/2018 at 2:21:06 PM.    Final     Labs:  CBC: Recent Labs    02/16/18 0707 02/17/18 0431 02/18/18 0859 02/19/18 0433  WBC 15.1* 16.0* 13.2* 12.5*  HGB 9.9* 11.1* 10.0* 9.6*  HCT 31.6* 37.5 33.4* 30.6*  PLT 378 371 405* 359    COAGS: Recent Labs    01/27/18 1553 02/11/18 0429  INR 1.12 1.11  APTT 28 37*    BMP: Recent Labs    02/14/18 0357 02/15/18 0320 02/16/18 0707 02/18/18 0859  NA 144 143 143 145  K 3.9 3.4* 4.0 3.6  CL 110 109 108 104  CO2 26 27 27 30   GLUCOSE 130* 182* 163* 120*  BUN 42* 40* 40* 33*  CALCIUM 8.1* 8.3* 8.3* 8.4*  CREATININE 0.88 0.94 0.97 0.81  GFRNONAA 59* 55* 53* >60  GFRAA >60 >60 >60 >60    LIVER FUNCTION TESTS: Recent Labs    01/27/18 1553 01/28/18 0639  BILITOT 1.1 1.7*  AST 66* 74*  ALT 38 37  ALKPHOS 66 63  PROT 7.6 7.6  ALBUMIN 3.6 3.4*    Assessment and Plan:  Dysphagia secondary to CVA  S/P Gastrostomy tube placement 02/18/18 by Dr. Lowella Dandy.  Ok to begin using g-tube for feeds  and meds.  Routine G-tube care.  Electronically Signed: Gwynneth Macleod, PA-C 02/19/2018, 11:15 AM    I spent a total of 15 Minutes at the the patient's bedside AND on the patient's hospital floor or unit, greater than 50% of which was counseling/coordinating care for f/u G-tube.

## 2018-02-19 NOTE — Discharge Summary (Addendum)
Physician Discharge Summary  Patient ID: Kelli Vazquez MRN: [id number] DOB/AGE: 1929-11-20 85 y.o.  Admit date: 01/27/2018 Discharge date: 02/19/2018  Admission Diagnoses:  Right hemiplegia Left MCA embolic stroke  Discharge Diagnoses:  Active Problems:   Acute ischemic left MCA stroke (HCC)   Goals of care, counseling/discussion   Palliative care by specialist   Acute respiratory failure with hypoxemia ([place])   Status post tracheostomy ([place])   Status post insertion of percutaneous endoscopic gastrostomy (PEG) tube [facility])   Discharged Condition: fair  Hospital Course: 85 year old woman admitted with acute right hemiplegia.  Found to have left MCA stroke. Presented outside of intervention window. Developed hemorrhagic conversion with increasing mass-effect. Palliative care involved and poor prognosis discussed. Plan was initially for one-way extubation and possible transition to comfort care, but patient family opted instead for tracheostomy and PEG tube. Tracheostomy placed 2/11, PEG tube placed 2/17.  She is somnolent but will awaken and mimic commands on the left side.  She has been unable to wean from mechanical ventilation   Consults: pulmonary/intensive care and neurology  Significant Diagnostic Studies: radiology: CT scan: 01/29/2018-evolving left MCA distribution infarct with increased mass-effect.  Confluent petechial hemorrhage  Treatments: IV hydration, analgesia: acetaminophen and fentanyl, anticoagulation: ASA and full anticoagulation for atrial fibrillation held given hemorrhagic conversion and overall poor prognosis, respiratory therapy: Mechanical ventilation and procedures: Bedside percutaneous tracheostomy, IR guided PEG tube  Discharge Exam: Blood pressure (!) 99/51, pulse 79, temperature 98.2 F (36.8 C), temperature source Axillary, resp. rate (!) 25, height 5' 3"  (1.6 m), weight 73.8 kg, SpO2 98 %. General appearance: appears stated age, no distress and  moderately obese Eyes: Conjunctiva are normal she has roving eye movements. Neck: no JVD and A #6 cuffed tracheostomy tube is in place Resp: rhonchi bilaterally Cardio: irregularly irregular rhythm and S1, S2 normal GI: PEG tube in place with abdominal binder.  Ecchymoses from subcutaneous heparin Skin: Age-related skin fragility with easy bruising Neurologic: Mental status: Alert, oriented, thought content appropriate, Somnolent with periods of alertness affect is flat Cranial nerves: normal Motor: Right hemiplegia.  Will move left side antigravity  Disposition: Discharge disposition: She is being transferred to [facility] for long-term weaning.  Will need eventual nursing home placement.  Allergies as of 02/19/2018      Reactions   Latex Rash   Penicillins Rash   Did it involve swelling of the face/tongue/throat, SOB, or low BP? No Did it involve sudden or severe rash/hives, skin peeling, or any reaction on the inside of your mouth or nose? No Did you need to seek medical attention at a hospital or doctor's office? No When did it last happen? If all above answers are "NO", may proceed with cephalosporin use.      Medication List    STOP taking these medications   aspirin EC 81 MG tablet Replaced by:  aspirin 81 MG chewable tablet   diltiazem 120 MG 24 hr capsule Commonly known as:  DILACOR XR Replaced by:  diltiazem 10 mg/ml  oral suspension   meclizine 12.5 MG tablet Commonly known as:  ANTIVERT   metolazone 2.5 MG tablet Commonly known as:  ZAROXOLYN   potassium chloride 10 MEQ tablet Commonly known as:  K-DUR,KLOR-CON     TAKE these medications   acetaminophen 325 MG tablet Commonly known as:  TYLENOL Take 2 tablets (650 mg total) by mouth every 4 (four) hours as needed for mild pain (or temp > 37.5 C (99.5 F)).   aspirin 81 MG chewable  tablet Place 1 tablet (81 mg total) into feeding tube daily. Start taking on:  February 20, 2018 Replaces:  aspirin EC 81 MG  tablet   atorvastatin 20 MG tablet Commonly known as:  LIPITOR Place 1 tablet (20 mg total) into feeding tube daily at 6 PM.   bisacodyl 10 MG suppository Commonly known as:  DULCOLAX Place 1 suppository (10 mg total) rectally daily as needed for moderate constipation.   chlorhexidine gluconate (MEDLINE KIT) 0.12 % solution Commonly known as:  PERIDEX 15 mLs by Mouth Rinse route 2 (two) times daily.   diltiazem 10 mg/ml  oral suspension Commonly known as:  CARDIZEM Place 3 mLs (30 mg total) into feeding tube every 6 (six) hours. Replaces:  diltiazem 120 MG 24 hr capsule   docusate 50 MG/5ML liquid Commonly known as:  COLACE Place 10 mLs (100 mg total) into feeding tube 2 (two) times daily.   enoxaparin 40 MG/0.4ML injection Commonly known as:  LOVENOX Inject 0.4 mLs (40 mg total) into the skin daily. Start taking on:  February 20, 2018   feeding supplement (OSMOLITE 1.2 CAL) Liqd Place 1,000 mLs into feeding tube continuous.   feeding supplement (PRO-STAT SUGAR FREE 64) Liqd Place 30 mLs into feeding tube 2 (two) times daily.   free water Soln Place 400 mLs into feeding tube 2 times daily at 12 noon and 4 pm. Start taking on:  February 20, 2018   insulin aspart 100 UNIT/ML injection Commonly known as:  novoLOG Inject 1-3 Units into the skin every 4 (four) hours.   mouth rinse Liqd solution 15 mLs by Mouth Rinse route every morning.   pantoprazole sodium 40 mg/20 mL Pack Commonly known as:  PROTONIX Place 20 mLs (40 mg total) into feeding tube daily. Start taking on:  February 20, 2018   polyethylene glycol packet Commonly known as:  MIRALAX / GLYCOLAX Place 17 g into feeding tube 2 (two) times daily.   torsemide 20 MG tablet Commonly known as:  DEMADEX Place 2 tablets (40 mg total) into feeding tube daily. Start taking on:  February 20, 2018      40 minutes were spent preparing this discharge.  Signed: .rasi  02/19/2018, 1:09 PM

## 2018-02-19 NOTE — Progress Notes (Signed)
Trach sutures removed per MD order.  No complications.

## 2018-02-19 NOTE — Progress Notes (Addendum)
Insurance authorization has been received, and bed available at [facility] today.  Plan discharge to [facility]; MD to prepare dc summary and put in dc order.  Bedside nurse will need to call report to [phone].  Spoke with pt's daughter, Marylu Lund, at bedside, and she is agreeable to transfer today.  Quintella Baton, RN, BSN  Trauma/Neuro ICU Case Manager [phone]

## 2018-02-19 NOTE — Progress Notes (Signed)
SLP Cancellation Note  Patient Details Name: Kelli Vazquez MRN: [id number] DOB: 1929-12-19   Cancelled treatment:        Remains on ventilator. Will follow.   Royce Macadamia 02/19/2018, 7:57 AM   Breck Coons Lonell Face.Ed Nurse, children's [phone] Office [phone]

## 2018-02-19 NOTE — Progress Notes (Signed)
Pt transported from [facility] to [facility], without incident.

## 2018-02-20 DIAGNOSIS — I63512 Cerebral infarction due to unspecified occlusion or stenosis of left middle cerebral artery: Secondary | ICD-10-CM | POA: Diagnosis not present

## 2018-02-20 DIAGNOSIS — I5033 Acute on chronic diastolic (congestive) heart failure: Secondary | ICD-10-CM

## 2018-02-20 DIAGNOSIS — Z930 Tracheostomy status: Secondary | ICD-10-CM

## 2018-02-20 DIAGNOSIS — J9601 Acute respiratory failure with hypoxia: Secondary | ICD-10-CM | POA: Diagnosis not present

## 2018-02-20 DIAGNOSIS — I482 Chronic atrial fibrillation, unspecified: Secondary | ICD-10-CM | POA: Diagnosis not present

## 2018-02-20 LAB — COMPREHENSIVE METABOLIC PANEL
ALT: 73 U/L — ABNORMAL HIGH (ref 0–44)
AST: 70 U/L — ABNORMAL HIGH (ref 15–41)
Albumin: 1.7 g/dL — ABNORMAL LOW (ref 3.5–5.0)
Alkaline Phosphatase: 129 U/L — ABNORMAL HIGH (ref 38–126)
Anion gap: 13 (ref 5–15)
BUN: 39 mg/dL — ABNORMAL HIGH (ref 8–23)
CO2: 33 mmol/L — AB (ref 22–32)
Calcium: 8.5 mg/dL — ABNORMAL LOW (ref 8.9–10.3)
Chloride: 98 mmol/L (ref 98–111)
Creatinine, Ser: 1.03 mg/dL — ABNORMAL HIGH (ref 0.44–1.00)
GFR calc Af Amer: 57 mL/min — ABNORMAL LOW (ref >60)
GFR calc non Af Amer: 49 mL/min — ABNORMAL LOW (ref >60)
Glucose, Bld: 197 mg/dL — ABNORMAL HIGH (ref 70–99)
POTASSIUM: 2.6 mmol/L — AB (ref 3.5–5.1)
Sodium: 144 mmol/L (ref 135–145)
Total Bilirubin: 0.7 mg/dL (ref 0.3–1.2)
Total Protein: 6.5 g/dL (ref 6.5–8.1)

## 2018-02-20 LAB — PROTIME-INR
INR: 1.18
Prothrombin Time: 14.9 seconds (ref 11.4–15.2)

## 2018-02-20 LAB — MAGNESIUM: Magnesium: 2 mg/dL (ref 1.7–2.4)

## 2018-02-20 LAB — URINALYSIS, ROUTINE W REFLEX MICROSCOPIC
Bilirubin Urine: NEGATIVE
Glucose, UA: NEGATIVE mg/dL
Hgb urine dipstick: NEGATIVE
Ketones, ur: NEGATIVE mg/dL
Nitrite: NEGATIVE
Protein, ur: NEGATIVE mg/dL
SPECIFIC GRAVITY, URINE: 1.013 (ref 1.005–1.030)
pH: 7 (ref 5.0–8.0)

## 2018-02-20 LAB — CBC WITH DIFFERENTIAL/PLATELET
Abs Immature Granulocytes: 0.07 10*3/uL (ref 0.00–0.07)
BASOS PCT: 0 %
Basophils Absolute: 0 10*3/uL (ref 0.0–0.1)
Eosinophils Absolute: 0 10*3/uL (ref 0.0–0.5)
Eosinophils Relative: 0 %
HCT: 32.9 % — ABNORMAL LOW (ref 36.0–46.0)
Hemoglobin: 10.2 g/dL — ABNORMAL LOW (ref 12.0–15.0)
Immature Granulocytes: 1 %
Lymphocytes Relative: 6 %
Lymphs Abs: 0.7 10*3/uL (ref 0.7–4.0)
MCH: 28.6 pg (ref 26.0–34.0)
MCHC: 31 g/dL (ref 30.0–36.0)
MCV: 92.2 fL (ref 80.0–100.0)
Monocytes Absolute: 0.8 10*3/uL (ref 0.1–1.0)
Monocytes Relative: 7 %
NRBC: 0 % (ref 0.0–0.2)
Neutro Abs: 9.8 10*3/uL — ABNORMAL HIGH (ref 1.7–7.7)
Neutrophils Relative %: 86 %
Platelets: 383 10*3/uL (ref 150–400)
RBC: 3.57 MIL/uL — ABNORMAL LOW (ref 3.87–5.11)
RDW: 15.9 % — AB (ref 11.5–15.5)
WBC: 11.4 10*3/uL — ABNORMAL HIGH (ref 4.0–10.5)

## 2018-02-20 LAB — PHOSPHORUS: Phosphorus: 4 mg/dL (ref 2.5–4.6)

## 2018-02-20 LAB — T4, FREE: Free T4: 2.48 ng/dL — ABNORMAL HIGH (ref 0.82–1.77)

## 2018-02-20 LAB — TSH: TSH: 0.046 u[IU]/mL — ABNORMAL LOW (ref 0.350–4.500)

## 2018-02-20 NOTE — Consult Note (Signed)
[facility] [place]  [facility]  Date of Service: 02/20/2018  PULMONARY CRITICAL CARE Kelli CarnesCONSULT   Kelli Vazquez  ZOX:[id number]  DOB: 22-Oct-1929   DOA: 02/19/2018  Referring Physician: Carron CurieAli Hijazi, MD  HPI: Kelli Vazquez is a 85 y.o. female seen for follow up of Acute on Chronic Respiratory Failure.  Patient is transferred to our facility for further management and weaning.  Patient apparently had an acute left middle cerebral artery stroke and was admitted to the intensive care unit.  Hospital course was that she was found to have acute right-sided hemiplegia and the stroke initially was dry however converted to a hemorrhagic with the development of mass-effect.  The patient was felt to have a very poor prognosis and palliative care was asked to see the patient however the family wanted everything done.  Patient subsequently underwent a tracheostomy.  Patient also had a PEG tube placed.  At the time of evaluation at the bedside patient is nonverbal.  Patient's daughter was in the room and she was able to be spoken to as far as the weaning plan is concerned.  Currently patient is on pressure support mode and is on 28% FiO2.  Review of Systems:  ROS performed and is unremarkable other than noted above.  No past medical history on file.  Past Surgical History:  Procedure Laterality Date  . IR GASTROSTOMY TUBE MOD SED  02/18/2018    Social History:    has no history on file for tobacco, alcohol, and drug.  Family History: Non-Contributory to the present illness  Allergies  Allergen Reactions  . Latex Rash  . Penicillins Rash    Did it involve swelling of the face/tongue/throat, SOB, or low BP? No Did it involve sudden or severe rash/hives, skin peeling, or any reaction on the inside of your mouth or nose? No Did you need to seek medical attention at a hospital or doctor's office? No When did it last happen? If all above answers  are "NO", may proceed with cephalosporin use.    Medications: Reviewed on Rounds  Physical Exam:  Vitals: Temperature 97.6 pulse 75 respiratory 25 blood pressure 135/68 saturations 96%  Ventilator Settings mode ventilation pressure support FiO2 28% tidal volume 320 respiratory rate 16 PEEP 5  . General: Comfortable at this time . Eyes: Grossly normal lids, irises & conjunctiva . ENT: grossly tongue is normal . Neck: no obvious mass . Cardiovascular: S1-S2 normal no gallop or rub is noted at this time . Respiratory: Coarse breath sounds with a few rhonchi . Abdomen: Soft and nontender . Skin: no rash seen on limited exam . Musculoskeletal: not rigid . Psychiatric:unable to assess . Neurologic: no seizure no involuntary movements         Labs on Admission:  Basic Metabolic Panel: Recent Labs  Lab 02/14/18 0357 02/15/18 0320 02/16/18 0707 02/17/18 0431 02/18/18 0859 02/19/18 0433 02/20/18 0356  NA 144 143 143  --  145  --  144  K 3.9 3.4* 4.0  --  3.6  --  2.6*  CL 110 109 108  --  104  --  98  CO2 26 27 27   --  30  --  33*  GLUCOSE 130* 182* 163*  --  120*  --  197*  BUN 42* 40* 40*  --  33*  --  39*  CREATININE 0.88 0.94 0.97  --  0.81  --  1.03*  CALCIUM 8.1* 8.3* 8.3*  --  8.4*  --  8.5*  MG 2.4 2.3 2.3 2.3 2.3 2.3 2.0  PHOS 3.8 4.0  --   --   --   --  4.0    No results for input(s): PHART, PCO2ART, PO2ART, HCO3, O2SAT in the last 168 hours.  Liver Function Tests: Recent Labs  Lab 02/20/18 0356  AST 70*  ALT 73*  ALKPHOS 129*  BILITOT 0.7  PROT 6.5  ALBUMIN 1.7*   No results for input(s): LIPASE, AMYLASE in the last 168 hours. No results for input(s): AMMONIA in the last 168 hours.  CBC: Recent Labs  Lab 02/16/18 0707 02/17/18 0431 02/18/18 0859 02/19/18 0433 02/20/18 0356  WBC 15.1* 16.0* 13.2* 12.5* 11.4*  NEUTROABS  --   --   --   --  9.8*  HGB 9.9* 11.1* 10.0* 9.6* 10.2*  HCT 31.6* 37.5 33.4* 30.6* 32.9*  MCV 94.0 94.9 94.9 92.4 92.2   PLT 378 371 405* 359 383    Cardiac Enzymes: No results for input(s): CKTOTAL, CKMB, CKMBINDEX, TROPONINI in the last 168 hours.  BNP (last 3 results) No results for input(s): BNP in the last 8760 hours.  ProBNP (last 3 results) No results for input(s): PROBNP in the last 8760 hours.   Radiological Exams on Admission: Dg Abd 1 View  Result Date: 02/19/2018 CLINICAL DATA:  Peg placement EXAM: ABDOMEN - 1 VIEW COMPARISON:  01/28/2018, CT 02/13/2018 FINDINGS: Portable supine view abdomen obtained following water-soluble contrast injection through previously placed PEG tube. Contrast opacifies the stomach. No gross extravasation on limited single view and small amount of contrast present. There is mild residual contrast within the colon. IMPRESSION: Contrast injection of the PEG tube opacifies the stomach. No gross extravasation allowing for static image and only small amount of contrast injected. Electronically Signed   By: Jasmine Pang M.D.   On: 02/19/2018 20:06   Ir Gastrostomy Tube Mod Sed  Result Date: 02/18/2018 INDICATION: 85 year old with CVA and dysphagia. Plan for gastrostomy tube placement. EXAM: PERCUTANEOUS GASTROSTOMY TUBE WITH FLUOROSCOPIC GUIDANCE Physician: Rachelle Hora. Henn, MD MEDICATIONS: Vancomycin 1 g; Antibiotics were administered within 1 hour of the procedure. Glucagon 1 mg IV ANESTHESIA/SEDATION: Versed 1.0 mg IV; Fentanyl 50 mcg IV Moderate Sedation Time:  26 The patient was continuously monitored during the procedure by the interventional radiology nurse under my direct supervision. FLUOROSCOPY TIME:  Fluoroscopy Time: 3 minutes and 18 seconds, 70 mGy COMPLICATIONS: None immediate. PROCEDURE: Informed consent was obtained for a percutaneous gastrostomy tube. The patient was placed on the interventional table. Fluoroscopy demonstrated gas in the transverse colon. An orogastric tube was placed with fluoroscopic guidance. The anterior abdomen was prepped and draped in sterile  fashion. Maximal barrier sterile technique was utilized including caps, mask, sterile gowns, sterile gloves, sterile drape, hand hygiene and skin antiseptic. Stomach was inflated with air through the orogastric tube. The skin and subcutaneous tissues were anesthetized with 1% lidocaine. A 17 gauge needle was directed into the distended stomach with fluoroscopic guidance. A wire was advanced into the stomach and a T-tact was deployed. A 9-French vascular sheath was placed and the orogastric tube was snared using a Gooseneck snare device. The orogastric tube and snare were pulled out of the patient's mouth. The snare device was connected to a 20-French gastrostomy tube. The snare device and gastrostomy tube were pulled through the patient's mouth and out the anterior abdominal wall. The gastrostomy tube was cut to an appropriate length. Contrast injection through gastrostomy tube confirmed placement within the stomach. Fluoroscopic images  were obtained for documentation. The gastrostomy tube was flushed with normal saline. Another incision was made near the gastrostomy tube site during placement. This small incision was prepped with chlorhexidine and closed using Dermabond. IMPRESSION: Successful fluoroscopic guided percutaneous gastrostomy tube placement. Electronically Signed   By: Richarda Overlie M.D.   On: 02/18/2018 19:21   Dg Chest Port 1 View  Result Date: 02/19/2018 CLINICAL DATA:  Tracheostomy EXAM: PORTABLE CHEST 1 VIEW COMPARISON:  02/18/2018, 02/15/2018, 02/12/2018 FINDINGS: Tracheostomy tube tip about 5.4 cm superior to the carina. Bilateral pleural effusions and hazy bibasilar airspace disease. Cardiomegaly with central congestion. Aortic atherosclerosis. No pneumothorax. IMPRESSION: Tracheostomy tube tip about 5.4 cm superior to carina. No change in bilateral pleural effusions, bibasilar airspace disease and cardiomegaly with vascular congestion Electronically Signed   By: Jasmine Pang M.D.   On:  02/19/2018 20:08   Dg Chest Port 1 View  Result Date: 02/18/2018 CLINICAL DATA:  Respiratory failure. EXAM: PORTABLE CHEST 1 VIEW COMPARISON:  Radiograph of February 15, 2018. FINDINGS: Stable cardiomegaly. Tracheostomy and feeding tubes are unchanged in position. Atherosclerosis of thoracic aorta is noted. No pneumothorax is noted. Stable bibasilar atelectasis and effusions are noted, right greater than left. Bony thorax is unremarkable. IMPRESSION: Stable support apparatus. Stable bibasilar opacities as described above. Aortic Atherosclerosis (ICD10-I70.0). Electronically Signed   By: Lupita Raider, M.D.   On: 02/18/2018 08:12    Assessment/Plan Active Problems:   Acute ischemic left MCA stroke (HCC)   Acute respiratory failure with hypoxemia (HCC)   Status post tracheostomy (HCC)   Atrial fibrillation, chronic   Acute on chronic diastolic CHF (congestive heart failure) (HCC)   1. Acute on chronic respiratory failure with hypoxia at this time patient is on the wean protocol we will continue to try to advance right now is on pressure support and the goal is for about 2 hours today. 2. Acute left middle cerebral artery stroke converted to a hemorrhagic stroke.  Prognosis is felt to be poor family wants aggressive measures which will be continued at this time. 3. Atrial fibrillation patient continues on rate controlled we will continue with supportive care. 4. Acute on chronic diastolic heart failure patient is on diuretics which will be continued. 5. Tracheostomy right now secretions are fair to moderate we will continue to work towards weaning and liberation from the ventilator  I have personally seen and evaluated the patient, evaluated laboratory and imaging results, formulated the assessment and plan and placed orders. The Patient requires high complexity decision making for assessment and support.  Case was discussed on Rounds with the Respiratory Therapy Staff Time Spent   Yevonne Pax, MD [facility] Pulmonary Critical Care Medicine Sleep Medicine

## 2018-02-21 DIAGNOSIS — I63512 Cerebral infarction due to unspecified occlusion or stenosis of left middle cerebral artery: Secondary | ICD-10-CM | POA: Diagnosis not present

## 2018-02-21 DIAGNOSIS — I482 Chronic atrial fibrillation, unspecified: Secondary | ICD-10-CM | POA: Diagnosis not present

## 2018-02-21 DIAGNOSIS — J9601 Acute respiratory failure with hypoxia: Secondary | ICD-10-CM | POA: Diagnosis not present

## 2018-02-21 DIAGNOSIS — I5033 Acute on chronic diastolic (congestive) heart failure: Secondary | ICD-10-CM | POA: Diagnosis not present

## 2018-02-21 LAB — BASIC METABOLIC PANEL
Anion gap: 11 (ref 5–15)
BUN: 32 mg/dL — ABNORMAL HIGH (ref 8–23)
CO2: 31 mmol/L (ref 22–32)
Calcium: 8.2 mg/dL — ABNORMAL LOW (ref 8.9–10.3)
Chloride: 98 mmol/L (ref 98–111)
Creatinine, Ser: 0.96 mg/dL (ref 0.44–1.00)
GFR calc Af Amer: >60 mL/min (ref >60)
GFR calc non Af Amer: 54 mL/min — ABNORMAL LOW (ref >60)
Glucose, Bld: 165 mg/dL — ABNORMAL HIGH (ref 70–99)
Potassium: 4.1 mmol/L (ref 3.5–5.1)
Sodium: 140 mmol/L (ref 135–145)

## 2018-02-21 LAB — CBC
HCT: 33.3 % — ABNORMAL LOW (ref 36.0–46.0)
Hemoglobin: 10.2 g/dL — ABNORMAL LOW (ref 12.0–15.0)
MCH: 28.5 pg (ref 26.0–34.0)
MCHC: 30.6 g/dL (ref 30.0–36.0)
MCV: 93 fL (ref 80.0–100.0)
Platelets: 334 10*3/uL (ref 150–400)
RBC: 3.58 MIL/uL — ABNORMAL LOW (ref 3.87–5.11)
RDW: 16 % — ABNORMAL HIGH (ref 11.5–15.5)
WBC: 11.9 10*3/uL — ABNORMAL HIGH (ref 4.0–10.5)
nRBC: 0 % (ref 0.0–0.2)

## 2018-02-21 LAB — C DIFFICILE QUICK SCREEN W PCR REFLEX
C Diff antigen: NEGATIVE
C Diff interpretation: NOT DETECTED
C Diff toxin: NEGATIVE

## 2018-02-21 LAB — PHOSPHORUS: Phosphorus: 2.1 mg/dL — ABNORMAL LOW (ref 2.5–4.6)

## 2018-02-21 LAB — HEMOGLOBIN A1C
Hgb A1c MFr Bld: 6.1 % — ABNORMAL HIGH (ref 4.8–5.6)
Mean Plasma Glucose: 128 mg/dL

## 2018-02-21 LAB — MAGNESIUM: Magnesium: 2.1 mg/dL (ref 1.7–2.4)

## 2018-02-21 NOTE — Progress Notes (Addendum)
Pulmonary Critical Care Medicine [facility] [place]   PULMONARY CRITICAL CARE SERVICE  PROGRESS NOTE  Date of Service: 02/21/2018  Kelli Vazquez  [id number]  DOB: Apr 22, 1929   DOA: 02/19/2018  Referring Physician: Carron Curie, MD  HPI: Kelli Vazquez is a 85 y.o. female seen for follow up of Acute on Chronic Respiratory Failure.  Patient is weaning on pressure support the goal is for about 6 hours patient is decent volumes are noted  Medications: Reviewed on Rounds  Physical Exam:  Vitals: Temperature 100.0 pulse 92 respiratory 20 blood pressure 135/61 saturations 99%  Ventilator Settings mode ventilation pressure support FiO2 28% tidal volume 305 pressure support 12 PEEP 5  . General: Comfortable at this time . Eyes: Grossly normal lids, irises & conjunctiva . ENT: grossly tongue is normal . Neck: no obvious mass . Cardiovascular: S1 S2 normal no gallop . Respiratory: Scattered coarse breath sounds noted . Abdomen: soft . Skin: no rash seen on limited exam . Musculoskeletal: not rigid . Psychiatric:unable to assess . Neurologic: no seizure no involuntary movements         Lab Data:   Basic Metabolic Panel: Recent Labs  Lab 02/15/18 0320 02/16/18 0707 02/17/18 0431 02/18/18 0859 02/19/18 0433 02/20/18 0356 02/21/18 0440  NA 143 143  --  145  --  144 140  K 3.4* 4.0  --  3.6  --  2.6* 4.1  CL 109 108  --  104  --  98 98  CO2 27 27  --  30  --  33* 31  GLUCOSE 182* 163*  --  120*  --  197* 165*  BUN 40* 40*  --  33*  --  39* 32*  CREATININE 0.94 0.97  --  0.81  --  1.03* 0.96  CALCIUM 8.3* 8.3*  --  8.4*  --  8.5* 8.2*  MG 2.3 2.3 2.3 2.3 2.3 2.0 2.1  PHOS 4.0  --   --   --   --  4.0 2.1*    ABG: No results for input(s): PHART, PCO2ART, PO2ART, HCO3, O2SAT in the last 168 hours.  Liver Function Tests: Recent Labs  Lab 02/20/18 0356  AST 70*  ALT 73*  ALKPHOS 129*  BILITOT 0.7  PROT 6.5  ALBUMIN 1.7*   No results for input(s): LIPASE,  AMYLASE in the last 168 hours. No results for input(s): AMMONIA in the last 168 hours.  CBC: Recent Labs  Lab 02/17/18 0431 02/18/18 0859 02/19/18 0433 02/20/18 0356 02/21/18 0440  WBC 16.0* 13.2* 12.5* 11.4* 11.9*  NEUTROABS  --   --   --  9.8*  --   HGB 11.1* 10.0* 9.6* 10.2* 10.2*  HCT 37.5 33.4* 30.6* 32.9* 33.3*  MCV 94.9 94.9 92.4 92.2 93.0  PLT 371 405* 359 383 334    Cardiac Enzymes: No results for input(s): CKTOTAL, CKMB, CKMBINDEX, TROPONINI in the last 168 hours.  BNP (last 3 results) No results for input(s): BNP in the last 8760 hours.  ProBNP (last 3 results) No results for input(s): PROBNP in the last 8760 hours.  Radiological Exams: Dg Abd 1 View  Result Date: 02/19/2018 CLINICAL DATA:  Peg placement EXAM: ABDOMEN - 1 VIEW COMPARISON:  01/28/2018, CT 02/13/2018 FINDINGS: Portable supine view abdomen obtained following water-soluble contrast injection through previously placed PEG tube. Contrast opacifies the stomach. No gross extravasation on limited single view and small amount of contrast present. There is mild residual contrast within the colon. IMPRESSION: Contrast injection of  the PEG tube opacifies the stomach. No gross extravasation allowing for static image and only small amount of contrast injected. Electronically Signed   By: Jasmine Pang M.D.   On: 02/19/2018 20:06   Dg Chest Port 1 View  Result Date: 02/19/2018 CLINICAL DATA:  Tracheostomy EXAM: PORTABLE CHEST 1 VIEW COMPARISON:  02/18/2018, 02/15/2018, 02/12/2018 FINDINGS: Tracheostomy tube tip about 5.4 cm superior to the carina. Bilateral pleural effusions and hazy bibasilar airspace disease. Cardiomegaly with central congestion. Aortic atherosclerosis. No pneumothorax. IMPRESSION: Tracheostomy tube tip about 5.4 cm superior to carina. No change in bilateral pleural effusions, bibasilar airspace disease and cardiomegaly with vascular congestion Electronically Signed   By: Jasmine Pang M.D.   On:  02/19/2018 20:08    Assessment/Plan Active Problems:   Acute ischemic left MCA stroke (HCC)   Acute respiratory failure with hypoxemia (HCC)   Status post tracheostomy (HCC)   Atrial fibrillation, chronic   Acute on chronic diastolic CHF (congestive heart failure) (HCC)   1. Acute on chronic respiratory failure hypoxia we will continue with the wean protocol goal is 6 hours 2. Acute stroke unchanged we will continue with supportive care 3. Status post trach working towards capping eventually 4. Chronic atrial fibrillation rate controlled 5. Acute on chronic diastolic heart failure we will continue with diuretics as tolerated supportive care   I have personally seen and evaluated the patient, evaluated laboratory and imaging results, formulated the assessment and plan and placed orders.  Time 35 minutes on rounds The Patient requires high complexity decision making for assessment and support.  Case was discussed on Rounds with the Respiratory Therapy Staff  Yevonne Pax, MD [facility] Pulmonary Critical Care Medicine Sleep Medicine

## 2018-02-22 DIAGNOSIS — I5033 Acute on chronic diastolic (congestive) heart failure: Secondary | ICD-10-CM | POA: Diagnosis not present

## 2018-02-22 DIAGNOSIS — I63512 Cerebral infarction due to unspecified occlusion or stenosis of left middle cerebral artery: Secondary | ICD-10-CM | POA: Diagnosis not present

## 2018-02-22 DIAGNOSIS — I482 Chronic atrial fibrillation, unspecified: Secondary | ICD-10-CM | POA: Diagnosis not present

## 2018-02-22 DIAGNOSIS — J9601 Acute respiratory failure with hypoxia: Secondary | ICD-10-CM | POA: Diagnosis not present

## 2018-02-22 NOTE — Progress Notes (Signed)
Pulmonary Critical Care Medicine [facility] [place]   PULMONARY CRITICAL CARE SERVICE  PROGRESS NOTE  Date of Service: 02/22/2018  Kelli Vazquez  [id number]  DOB: 1929/11/16   DOA: 02/19/2018  Referring Physician: Carron Curie, MD  HPI: Kelli Vazquez is a 85 y.o. female seen for follow up of Acute on Chronic Respiratory Failure.  Patient is on pressure support with a goal of about 12 hours seems to be tolerating it well so far  Medications: Reviewed on Rounds  Physical Exam:  Vitals: Temperature 98.2 pulse 87 respiratory 21 blood pressure 121/66 saturations 98%  Ventilator Settings mode ventilation pressure support FiO2 28% pressure 12 PEEP 5  . General: Comfortable at this time . Eyes: Grossly normal lids, irises & conjunctiva . ENT: grossly tongue is normal . Neck: no obvious mass . Cardiovascular: S1 S2 normal no gallop . Respiratory: No rhonchi or rales are noted . Abdomen: soft . Skin: no rash seen on limited exam . Musculoskeletal: not rigid . Psychiatric:unable to assess . Neurologic: no seizure no involuntary movements         Lab Data:   Basic Metabolic Panel: Recent Labs  Lab 02/16/18 0707 02/17/18 0431 02/18/18 0859 02/19/18 0433 02/20/18 0356 02/21/18 0440  NA 143  --  145  --  144 140  K 4.0  --  3.6  --  2.6* 4.1  CL 108  --  104  --  98 98  CO2 27  --  30  --  33* 31  GLUCOSE 163*  --  120*  --  197* 165*  BUN 40*  --  33*  --  39* 32*  CREATININE 0.97  --  0.81  --  1.03* 0.96  CALCIUM 8.3*  --  8.4*  --  8.5* 8.2*  MG 2.3 2.3 2.3 2.3 2.0 2.1  PHOS  --   --   --   --  4.0 2.1*    ABG: No results for input(s): PHART, PCO2ART, PO2ART, HCO3, O2SAT in the last 168 hours.  Liver Function Tests: Recent Labs  Lab 02/20/18 0356  AST 70*  ALT 73*  ALKPHOS 129*  BILITOT 0.7  PROT 6.5  ALBUMIN 1.7*   No results for input(s): LIPASE, AMYLASE in the last 168 hours. No results for input(s): AMMONIA in the last 168  hours.  CBC: Recent Labs  Lab 02/17/18 0431 02/18/18 0859 02/19/18 0433 02/20/18 0356 02/21/18 0440  WBC 16.0* 13.2* 12.5* 11.4* 11.9*  NEUTROABS  --   --   --  9.8*  --   HGB 11.1* 10.0* 9.6* 10.2* 10.2*  HCT 37.5 33.4* 30.6* 32.9* 33.3*  MCV 94.9 94.9 92.4 92.2 93.0  PLT 371 405* 359 383 334    Cardiac Enzymes: No results for input(s): CKTOTAL, CKMB, CKMBINDEX, TROPONINI in the last 168 hours.  BNP (last 3 results) No results for input(s): BNP in the last 8760 hours.  ProBNP (last 3 results) No results for input(s): PROBNP in the last 8760 hours.  Radiological Exams: No results found.  Assessment/Plan Active Problems:   Acute ischemic left MCA stroke (HCC)   Acute respiratory failure with hypoxemia (HCC)   Status post tracheostomy (HCC)   Atrial fibrillation, chronic   Acute on chronic diastolic CHF (congestive heart failure) (HCC)   1. Acute on chronic respiratory failure hypoxia continue to wean on protocol goal is 12 hours as noted above 2. Acute stroke clinically stable improving 3. Status post tracheostomy at baseline 4. Chronic atrial  fibrillation rate controlled 5. Acute on chronic diastolic heart failure at baseline continue supportive care   I have personally seen and evaluated the patient, evaluated laboratory and imaging results, formulated the assessment and plan and placed orders. The Patient requires high complexity decision making for assessment and support.  Case was discussed on Rounds with the Respiratory Therapy Staff  Yevonne Pax, MD [facility] Pulmonary Critical Care Medicine Sleep Medicine

## 2018-02-23 DIAGNOSIS — J9601 Acute respiratory failure with hypoxia: Secondary | ICD-10-CM | POA: Diagnosis not present

## 2018-02-23 DIAGNOSIS — I482 Chronic atrial fibrillation, unspecified: Secondary | ICD-10-CM | POA: Diagnosis not present

## 2018-02-23 DIAGNOSIS — I63512 Cerebral infarction due to unspecified occlusion or stenosis of left middle cerebral artery: Secondary | ICD-10-CM | POA: Diagnosis not present

## 2018-02-23 DIAGNOSIS — I5033 Acute on chronic diastolic (congestive) heart failure: Secondary | ICD-10-CM | POA: Diagnosis not present

## 2018-02-23 LAB — CBC
HCT: 34.3 % — ABNORMAL LOW (ref 36.0–46.0)
Hemoglobin: 10.5 g/dL — ABNORMAL LOW (ref 12.0–15.0)
MCH: 28.7 pg (ref 26.0–34.0)
MCHC: 30.6 g/dL (ref 30.0–36.0)
MCV: 93.7 fL (ref 80.0–100.0)
Platelets: 300 10*3/uL (ref 150–400)
RBC: 3.66 MIL/uL — AB (ref 3.87–5.11)
RDW: 15.9 % — ABNORMAL HIGH (ref 11.5–15.5)
WBC: 12.4 10*3/uL — ABNORMAL HIGH (ref 4.0–10.5)
nRBC: 0 % (ref 0.0–0.2)

## 2018-02-23 LAB — URINE CULTURE

## 2018-02-23 LAB — BASIC METABOLIC PANEL
Anion gap: 10 (ref 5–15)
BUN: 33 mg/dL — ABNORMAL HIGH (ref 8–23)
CO2: 37 mmol/L — ABNORMAL HIGH (ref 22–32)
Calcium: 8.3 mg/dL — ABNORMAL LOW (ref 8.9–10.3)
Chloride: 96 mmol/L — ABNORMAL LOW (ref 98–111)
Creatinine, Ser: 0.91 mg/dL (ref 0.44–1.00)
GFR calc Af Amer: >60 mL/min (ref >60)
GFR calc non Af Amer: 57 mL/min — ABNORMAL LOW (ref >60)
Glucose, Bld: 153 mg/dL — ABNORMAL HIGH (ref 70–99)
POTASSIUM: 3.7 mmol/L (ref 3.5–5.1)
Sodium: 143 mmol/L (ref 135–145)

## 2018-02-23 LAB — MAGNESIUM: Magnesium: 2.4 mg/dL (ref 1.7–2.4)

## 2018-02-23 LAB — PHOSPHORUS: Phosphorus: 3.4 mg/dL (ref 2.5–4.6)

## 2018-02-23 NOTE — Progress Notes (Addendum)
Pulmonary Critical Care Medicine [facility] [place]   PULMONARY CRITICAL CARE SERVICE  PROGRESS NOTE  Date of Service: 02/23/2018  Kelli Vazquez  [id number]  DOB: 05-03-1929   DOA: 02/19/2018  Referring Physician: Carron Curie, MD  HPI: Kelli Vazquez is a 85 y.o. female seen for follow up of Acute on Chronic Respiratory Failure.  Patient was able to complete 12 hours on pressure support yesterday.  The goal is 16 hours today.  Currently pressure support is 12/5 with FiO2 of 28%.  Medications: Reviewed on Rounds  Physical Exam:  Vitals: Pulse 86 respirations 16 BP 115/66 O2 sat 98% temp 98.2  Ventilator Settings ventilator mode currently pressure support 12/5 with FiO2 28% When resting on vent the vent is AC VC rate of 16 tidal volume 320 PEEP of 5 FiO2 28%.  . General: Comfortable at this time . Eyes: Grossly normal lids, irises & conjunctiva . ENT: grossly tongue is normal . Neck: no obvious mass . Cardiovascular: S1 S2 normal no gallop . Respiratory: No rales or rhonchi noted . Abdomen: soft . Skin: no rash seen on limited exam . Musculoskeletal: not rigid . Psychiatric:unable to assess . Neurologic: no seizure no involuntary movements         Lab Data:   Basic Metabolic Panel: Recent Labs  Lab 02/18/18 0859 02/19/18 0433 02/20/18 0356 02/21/18 0440 02/23/18 0522  NA 145  --  144 140 143  K 3.6  --  2.6* 4.1 3.7  CL 104  --  98 98 96*  CO2 30  --  33* 31 37*  GLUCOSE 120*  --  197* 165* 153*  BUN 33*  --  39* 32* 33*  CREATININE 0.81  --  1.03* 0.96 0.91  CALCIUM 8.4*  --  8.5* 8.2* 8.3*  MG 2.3 2.3 2.0 2.1 2.4  PHOS  --   --  4.0 2.1* 3.4    ABG: No results for input(s): PHART, PCO2ART, PO2ART, HCO3, O2SAT in the last 168 hours.  Liver Function Tests: Recent Labs  Lab 02/20/18 0356  AST 70*  ALT 73*  ALKPHOS 129*  BILITOT 0.7  PROT 6.5  ALBUMIN 1.7*   No results for input(s): LIPASE, AMYLASE in the last 168 hours. No results for  input(s): AMMONIA in the last 168 hours.  CBC: Recent Labs  Lab 02/18/18 0859 02/19/18 0433 02/20/18 0356 02/21/18 0440 02/23/18 0522  WBC 13.2* 12.5* 11.4* 11.9* 12.4*  NEUTROABS  --   --  9.8*  --   --   HGB 10.0* 9.6* 10.2* 10.2* 10.5*  HCT 33.4* 30.6* 32.9* 33.3* 34.3*  MCV 94.9 92.4 92.2 93.0 93.7  PLT 405* 359 383 334 300    Cardiac Enzymes: No results for input(s): CKTOTAL, CKMB, CKMBINDEX, TROPONINI in the last 168 hours.  BNP (last 3 results) No results for input(s): BNP in the last 8760 hours.  ProBNP (last 3 results) No results for input(s): PROBNP in the last 8760 hours.  Radiological Exams: No results found.  Assessment/Plan Active Problems:   Acute ischemic left MCA stroke (HCC)   Acute respiratory failure with hypoxemia (HCC)   Status post tracheostomy (HCC)   Atrial fibrillation, chronic   Acute on chronic diastolic CHF (congestive heart failure) (HCC)   1. Acute on chronic respiratory failure with hypoxia continue to wean per protocol.  Goal is 16 hours today on pressure support. 2. Acute stroke clinically stable improving 3. Status post tracheostomy at baseline 4. Chronic atrial fibrillation rate controlled  5. Acute on chronic diastolic heart failure at baseline continue supportive care   I have personally seen and evaluated the patient, evaluated laboratory and imaging results, formulated the assessment and plan and placed orders. The Patient requires high complexity decision making for assessment and support.  Case was discussed on Rounds with the Respiratory Therapy Staff  Yevonne Pax, MD [facility] Pulmonary Critical Care Medicine Sleep Medicine

## 2018-02-24 DIAGNOSIS — I482 Chronic atrial fibrillation, unspecified: Secondary | ICD-10-CM | POA: Diagnosis not present

## 2018-02-24 DIAGNOSIS — J9601 Acute respiratory failure with hypoxia: Secondary | ICD-10-CM | POA: Diagnosis not present

## 2018-02-24 DIAGNOSIS — I63512 Cerebral infarction due to unspecified occlusion or stenosis of left middle cerebral artery: Secondary | ICD-10-CM | POA: Diagnosis not present

## 2018-02-24 DIAGNOSIS — I5033 Acute on chronic diastolic (congestive) heart failure: Secondary | ICD-10-CM | POA: Diagnosis not present

## 2018-02-24 NOTE — Progress Notes (Addendum)
Pulmonary Critical Care Medicine [facility] [place]   PULMONARY CRITICAL CARE SERVICE  PROGRESS NOTE  Date of Service: 02/24/2018  Revia Lovelady  UDJ:[id number]  DOB: [birth year]   DOA: 02/19/2018  Referring Physician: Carron Curie, MD  HPI: Chrisanna Hinkson is a 85 y.o. female seen for follow up of Acute on Chronic Respiratory Failure.  Patient was able to tolerate 16 hours on pressure support yesterday.  Today she did 2 hours on trach collar followed by 4 hours on pressure support and is now resting on the vent.  No acute distress is noted at this time.  Medications: Reviewed on Rounds  Physical Exam:  Vitals: Pulse 89 respiration 17 BP 121/64 O2 sat 100% temp 97.5  Ventilator Settings ventilator mode AC VC rate of 16 tidal volume 320 PEEP of 5 FiO2 28%  . General: Comfortable at this time . Eyes: Grossly normal lids, irises & conjunctiva . ENT: grossly tongue is normal . Neck: no obvious mass . Cardiovascular: S1 S2 normal no gallop . Respiratory: No rales or rhonchi noted . Abdomen: soft . Skin: no rash seen on limited exam . Musculoskeletal: not rigid . Psychiatric:unable to assess . Neurologic: no seizure no involuntary movements         Lab Data:   Basic Metabolic Panel: Recent Labs  Lab 02/18/18 0859 02/19/18 0433 02/20/18 0356 02/21/18 0440 02/23/18 0522  NA 145  --  144 140 143  K 3.6  --  2.6* 4.1 3.7  CL 104  --  98 98 96*  CO2 30  --  33* 31 37*  GLUCOSE 120*  --  197* 165* 153*  BUN 33*  --  39* 32* 33*  CREATININE 0.81  --  1.03* 0.96 0.91  CALCIUM 8.4*  --  8.5* 8.2* 8.3*  MG 2.3 2.3 2.0 2.1 2.4  PHOS  --   --  4.0 2.1* 3.4    ABG: No results for input(s): PHART, PCO2ART, PO2ART, HCO3, O2SAT in the last 168 hours.  Liver Function Tests: Recent Labs  Lab 02/20/18 0356  AST 70*  ALT 73*  ALKPHOS 129*  BILITOT 0.7  PROT 6.5  ALBUMIN 1.7*   No results for input(s): LIPASE, AMYLASE in the last 168 hours. No results for input(s):  AMMONIA in the last 168 hours.  CBC: Recent Labs  Lab 02/18/18 0859 02/19/18 0433 02/20/18 0356 02/21/18 0440 02/23/18 0522  WBC 13.2* 12.5* 11.4* 11.9* 12.4*  NEUTROABS  --   --  9.8*  --   --   HGB 10.0* 9.6* 10.2* 10.2* 10.5*  HCT 33.4* 30.6* 32.9* 33.3* 34.3*  MCV 94.9 92.4 92.2 93.0 93.7  PLT 405* 359 383 334 300    Cardiac Enzymes: No results for input(s): CKTOTAL, CKMB, CKMBINDEX, TROPONINI in the last 168 hours.  BNP (last 3 results) No results for input(s): BNP in the last 8760 hours.  ProBNP (last 3 results) No results for input(s): PROBNP in the last 8760 hours.  Radiological Exams: No results found.  Assessment/Plan Active Problems:   Acute ischemic left MCA stroke (HCC)   Acute respiratory failure with hypoxemia (HCC)   Status post tracheostomy (HCC)   Atrial fibrillation, chronic   Acute on chronic diastolic CHF (congestive heart failure) (HCC)   1. Acute on chronic respiratory failure with hypoxia continue to wean per protocol and progress to trach collar trials as tolerated. 2. Acute stroke clinically stable improving 3. Status post tracheostomy at baseline 4. Chronic atrial fibrillation rate controlled  5. Acute on chronic diastolic heart failure at baseline continue supportive care   I have personally seen and evaluated the patient, evaluated laboratory and imaging results, formulated the assessment and plan and placed orders. The Patient requires high complexity decision making for assessment and support.  Case was discussed on Rounds with the Respiratory Therapy Staff  Yevonne Pax, MD [facility] Pulmonary Critical Care Medicine Sleep Medicine

## 2018-02-25 DIAGNOSIS — I63512 Cerebral infarction due to unspecified occlusion or stenosis of left middle cerebral artery: Secondary | ICD-10-CM | POA: Diagnosis not present

## 2018-02-25 DIAGNOSIS — I5033 Acute on chronic diastolic (congestive) heart failure: Secondary | ICD-10-CM | POA: Diagnosis not present

## 2018-02-25 DIAGNOSIS — I482 Chronic atrial fibrillation, unspecified: Secondary | ICD-10-CM | POA: Diagnosis not present

## 2018-02-25 DIAGNOSIS — J9601 Acute respiratory failure with hypoxia: Secondary | ICD-10-CM | POA: Diagnosis not present

## 2018-02-25 LAB — CBC
HEMATOCRIT: 37.2 % (ref 36.0–46.0)
HEMOGLOBIN: 11.2 g/dL — AB (ref 12.0–15.0)
MCH: 28.2 pg (ref 26.0–34.0)
MCHC: 30.1 g/dL (ref 30.0–36.0)
MCV: 93.7 fL (ref 80.0–100.0)
Platelets: 283 10*3/uL (ref 150–400)
RBC: 3.97 MIL/uL (ref 3.87–5.11)
RDW: 15.9 % — ABNORMAL HIGH (ref 11.5–15.5)
WBC: 10.7 10*3/uL — ABNORMAL HIGH (ref 4.0–10.5)
nRBC: 0 % (ref 0.0–0.2)

## 2018-02-25 LAB — PROTIME-INR
INR: 1.24
Prothrombin Time: 15.4 seconds — ABNORMAL HIGH (ref 11.4–15.2)

## 2018-02-25 NOTE — Progress Notes (Signed)
Pulmonary Critical Care Medicine [facility] [place]   PULMONARY CRITICAL CARE SERVICE  PROGRESS NOTE  Date of Service: 02/25/2018  Kelli Vazquez  ZRA:[id number]  DOB: May 09, 1929   DOA: 02/19/2018  Referring Physician: Carron Curie, MD  HPI: Kelli Vazquez is a 85 y.o. female seen for follow up of Acute on Chronic Respiratory Failure.  Patient is on T collar right now the goal is for 4 hours  Medications: Reviewed on Rounds  Physical Exam:  Vitals: Temperature 96.8 pulse 84 respiratory 18 blood pressure 01[birth year]1971 saturations 100%  Ventilator Settings off ventilator on T collar right now 28% FiO2  . General: Comfortable at this time . Eyes: Grossly normal lids, irises & conjunctiva . ENT: grossly tongue is normal . Neck: no obvious mass . Cardiovascular: S1 S2 normal no gallop . Respiratory: No rhonchi or rales are noted at this time . Abdomen: soft . Skin: no rash seen on limited exam . Musculoskeletal: not rigid . Psychiatric:unable to assess . Neurologic: no seizure no involuntary movements         Lab Data:   Basic Metabolic Panel: Recent Labs  Lab 02/19/18 0433 02/20/18 0356 02/21/18 0440 02/23/18 0522  NA  --  144 140 143  K  --  2.6* 4.1 3.7  CL  --  98 98 96*  CO2  --  33* 31 37*  GLUCOSE  --  197* 165* 153*  BUN  --  39* 32* 33*  CREATININE  --  1.03* 0.96 0.91  CALCIUM  --  8.5* 8.2* 8.3*  MG 2.3 2.0 2.1 2.4  PHOS  --  4.0 2.1* 3.4    ABG: No results for input(s): PHART, PCO2ART, PO2ART, HCO3, O2SAT in the last 168 hours.  Liver Function Tests: Recent Labs  Lab 02/20/18 0356  AST 70*  ALT 73*  ALKPHOS 129*  BILITOT 0.7  PROT 6.5  ALBUMIN 1.7*   No results for input(s): LIPASE, AMYLASE in the last 168 hours. No results for input(s): AMMONIA in the last 168 hours.  CBC: Recent Labs  Lab 02/19/18 0433 02/20/18 0356 02/21/18 0440 02/23/18 0522 02/25/18 0542  WBC 12.5* 11.4* 11.9* 12.4* 10.7*  NEUTROABS  --  9.8*  --   --    --   HGB 9.6* 10.2* 10.2* 10.5* 11.2*  HCT 30.6* 32.9* 33.3* 34.3* 37.2  MCV 92.4 92.2 93.0 93.7 93.7  PLT 359 383 334 300 283    Cardiac Enzymes: No results for input(s): CKTOTAL, CKMB, CKMBINDEX, TROPONINI in the last 168 hours.  BNP (last 3 results) No results for input(s): BNP in the last 8760 hours.  ProBNP (last 3 results) No results for input(s): PROBNP in the last 8760 hours.  Radiological Exams: No results found.  Assessment/Plan Active Problems:   Acute ischemic left MCA stroke (HCC)   Acute respiratory failure with hypoxemia (HCC)   Status post tracheostomy (HCC)   Atrial fibrillation, chronic   Acute on chronic diastolic CHF (congestive heart failure) (HCC)   1. Acute on chronic respiratory failure with hypoxia we will continue with collar weaning the goal is 4 hours 2. Acute stroke continue therapy she is nonverbal 3. Status post tracheostomy remains in place 4. Chronic atrial fibrillation rate controlled 5. Acute on chronic systolic heart failure right now compensated   I have personally seen and evaluated the patient, evaluated laboratory and imaging results, formulated the assessment and plan and placed orders. The Patient requires high complexity decision making for assessment and support.  Case was discussed on Rounds with the Respiratory Therapy Staff  Allyne Gee, MD [facility] Pulmonary Critical Care Medicine Sleep Medicine

## 2018-02-26 DIAGNOSIS — J9601 Acute respiratory failure with hypoxia: Secondary | ICD-10-CM | POA: Diagnosis not present

## 2018-02-26 DIAGNOSIS — I5033 Acute on chronic diastolic (congestive) heart failure: Secondary | ICD-10-CM | POA: Diagnosis not present

## 2018-02-26 DIAGNOSIS — I63512 Cerebral infarction due to unspecified occlusion or stenosis of left middle cerebral artery: Secondary | ICD-10-CM | POA: Diagnosis not present

## 2018-02-26 DIAGNOSIS — I482 Chronic atrial fibrillation, unspecified: Secondary | ICD-10-CM | POA: Diagnosis not present

## 2018-02-26 NOTE — Progress Notes (Signed)
Pulmonary Critical Care Medicine [facility] [place]   PULMONARY CRITICAL CARE SERVICE  PROGRESS NOTE  Date of Service: 02/26/2018  Kelli Vazquez  EXN:[id number]  DOB: 1929-11-28   DOA: 02/19/2018  Referring Physician: Carron Curie, MD  HPI: Kelli Vazquez is a 85 y.o. female seen for follow up of Acute on Chronic Respiratory Failure.  Patient is on T collar currently the goal is for 12 hours so far seems to be tolerating it well  Medications: Reviewed on Rounds  Physical Exam:  Vitals: Temperature 97.5 pulse 85 respiratory 25 blood pressure 138/80 saturations 100%  Ventilator Settings on T collar FiO2 28%  . General: Comfortable at this time . Eyes: Grossly normal lids, irises & conjunctiva . ENT: grossly tongue is normal . Neck: no obvious mass . Cardiovascular: S1 S2 normal no gallop . Respiratory: No rhonchi or rales are noted . Abdomen: soft . Skin: no rash seen on limited exam . Musculoskeletal: not rigid . Psychiatric:unable to assess . Neurologic: no seizure no involuntary movements         Lab Data:   Basic Metabolic Panel: Recent Labs  Lab 02/20/18 0356 02/21/18 0440 02/23/18 0522  NA 144 140 143  K 2.6* 4.1 3.7  CL 98 98 96*  CO2 33* 31 37*  GLUCOSE 197* 165* 153*  BUN 39* 32* 33*  CREATININE 1.03* 0.96 0.91  CALCIUM 8.5* 8.2* 8.3*  MG 2.0 2.1 2.4  PHOS 4.0 2.1* 3.4    ABG: No results for input(s): PHART, PCO2ART, PO2ART, HCO3, O2SAT in the last 168 hours.  Liver Function Tests: Recent Labs  Lab 02/20/18 0356  AST 70*  ALT 73*  ALKPHOS 129*  BILITOT 0.7  PROT 6.5  ALBUMIN 1.7*   No results for input(s): LIPASE, AMYLASE in the last 168 hours. No results for input(s): AMMONIA in the last 168 hours.  CBC: Recent Labs  Lab 02/20/18 0356 02/21/18 0440 02/23/18 0522 02/25/18 0542  WBC 11.4* 11.9* 12.4* 10.7*  NEUTROABS 9.8*  --   --   --   HGB 10.2* 10.2* 10.5* 11.2*  HCT 32.9* 33.3* 34.3* 37.2  MCV 92.2 93.0 93.7 93.7   PLT 383 334 300 283    Cardiac Enzymes: No results for input(s): CKTOTAL, CKMB, CKMBINDEX, TROPONINI in the last 168 hours.  BNP (last 3 results) No results for input(s): BNP in the last 8760 hours.  ProBNP (last 3 results) No results for input(s): PROBNP in the last 8760 hours.  Radiological Exams: No results found.  Assessment/Plan Active Problems:   Acute ischemic left MCA stroke (HCC)   Acute respiratory failure with hypoxemia (HCC)   Status post tracheostomy (HCC)   Atrial fibrillation, chronic   Acute on chronic diastolic CHF (congestive heart failure) (HCC)   1. Acute on chronic respiratory failure with hypoxia patient is weaning on T collar will continue for 12-hour goal as noted 2. Acute stroke stable unchanged 3. Acute on chronic diastolic heart failure right now is compensated 4. Chronic atrial fibrillation rate controlled 5. Tracheostomy working towards capping   I have personally seen and evaluated the patient, evaluated laboratory and imaging results, formulated the assessment and plan and placed orders. The Patient requires high complexity decision making for assessment and support.  Case was discussed on Rounds with the Respiratory Therapy Staff  Yevonne Pax, MD [facility] Pulmonary Critical Care Medicine Sleep Medicine

## 2018-02-27 DIAGNOSIS — J9601 Acute respiratory failure with hypoxia: Secondary | ICD-10-CM | POA: Diagnosis not present

## 2018-02-27 DIAGNOSIS — I482 Chronic atrial fibrillation, unspecified: Secondary | ICD-10-CM | POA: Diagnosis not present

## 2018-02-27 DIAGNOSIS — I63512 Cerebral infarction due to unspecified occlusion or stenosis of left middle cerebral artery: Secondary | ICD-10-CM | POA: Diagnosis not present

## 2018-02-27 DIAGNOSIS — I5033 Acute on chronic diastolic (congestive) heart failure: Secondary | ICD-10-CM | POA: Diagnosis not present

## 2018-02-27 LAB — BASIC METABOLIC PANEL
ANION GAP: 12 (ref 5–15)
BUN: 43 mg/dL — ABNORMAL HIGH (ref 8–23)
CO2: 30 mmol/L (ref 22–32)
Calcium: 10 mg/dL (ref 8.9–10.3)
Chloride: 93 mmol/L — ABNORMAL LOW (ref 98–111)
Creatinine, Ser: 1.2 mg/dL — ABNORMAL HIGH (ref 0.44–1.00)
GFR calc Af Amer: 47 mL/min — ABNORMAL LOW (ref >60)
GFR calc non Af Amer: 41 mL/min — ABNORMAL LOW (ref >60)
Glucose, Bld: 126 mg/dL — ABNORMAL HIGH (ref 70–99)
Potassium: 3.8 mmol/L (ref 3.5–5.1)
Sodium: 135 mmol/L (ref 135–145)

## 2018-02-27 LAB — CBC
HEMATOCRIT: 35.3 % — AB (ref 36.0–46.0)
Hemoglobin: 10.7 g/dL — ABNORMAL LOW (ref 12.0–15.0)
MCH: 29.5 pg (ref 26.0–34.0)
MCHC: 30.3 g/dL (ref 30.0–36.0)
MCV: 97.2 fL (ref 80.0–100.0)
Platelets: 252 10*3/uL (ref 150–400)
RBC: 3.63 MIL/uL — ABNORMAL LOW (ref 3.87–5.11)
RDW: 16 % — ABNORMAL HIGH (ref 11.5–15.5)
WBC: 8.8 10*3/uL (ref 4.0–10.5)
nRBC: 0 % (ref 0.0–0.2)

## 2018-02-27 LAB — MAGNESIUM: Magnesium: 2.1 mg/dL (ref 1.7–2.4)

## 2018-02-27 NOTE — Progress Notes (Addendum)
Pulmonary Critical Care Medicine [facility] [place]   PULMONARY CRITICAL CARE SERVICE  PROGRESS NOTE  Date of Service: 02/27/2018  Kelli Vazquez  IZT:[id number]  DOB: [birth year]   DOA: 02/19/2018  Referring Physician: Carron Curie, MD  HPI: Kelli Vazquez is a 85 y.o. female seen for follow up of Acute on Chronic Respiratory Failure.  Patient was able to tolerate 12 hours on trach collar yesterday.  With a goal of 16 hours on trach collar today with an FiO2 of 28%.  Patient is currently doing well.  Medications: Reviewed on Rounds  Physical Exam:  Vitals: Pulse 88 respirations 22 BP 114/69 O2 sat 90% temp 97.5  Ventilator Settings patient is currently on T collar 20% however will rest on the vent after there is 16-hour goal with ventilator mode AC VC tidal volume 320 rate 16 PEEP of 5 FiO2 28%  . General: Comfortable at this time . Eyes: Grossly normal lids, irises & conjunctiva . ENT: grossly tongue is normal . Neck: no obvious mass . Cardiovascular: S1 S2 normal no gallop . Respiratory: No rales or rhonchi noted . Abdomen: soft . Skin: no rash seen on limited exam . Musculoskeletal: not rigid . Psychiatric:unable to assess . Neurologic: no seizure no involuntary movements         Lab Data:   Basic Metabolic Panel: Recent Labs  Lab 02/21/18 0440 02/23/18 0522 02/27/18 0546  NA 140 143 135  K 4.1 3.7 3.8  CL 98 96* 93*  CO2 31 37* 30  GLUCOSE 165* 153* 126*  BUN 32* 33* 43*  CREATININE 0.96 0.91 1.20*  CALCIUM 8.2* 8.3* 10.0  MG 2.1 2.4 2.1  PHOS 2.1* 3.4  --     ABG: No results for input(s): PHART, PCO2ART, PO2ART, HCO3, O2SAT in the last 168 hours.  Liver Function Tests: No results for input(s): AST, ALT, ALKPHOS, BILITOT, PROT, ALBUMIN in the last 168 hours. No results for input(s): LIPASE, AMYLASE in the last 168 hours. No results for input(s): AMMONIA in the last 168 hours.  CBC: Recent Labs  Lab 02/21/18 0440 02/23/18 0522  02/25/18 0542 02/27/18 0546  WBC 11.9* 12.4* 10.7* 8.8  HGB 10.2* 10.5* 11.2* 10.7*  HCT 33.3* 34.3* 37.2 35.3*  MCV 93.0 93.7 93.7 97.2  PLT 334 300 283 252    Cardiac Enzymes: No results for input(s): CKTOTAL, CKMB, CKMBINDEX, TROPONINI in the last 168 hours.  BNP (last 3 results) No results for input(s): BNP in the last 8760 hours.  ProBNP (last 3 results) No results for input(s): PROBNP in the last 8760 hours.  Radiological Exams: No results found.  Assessment/Plan Active Problems:   Acute ischemic left MCA stroke (HCC)   Acute respiratory failure with hypoxemia (HCC)   Status post tracheostomy (HCC)   Atrial fibrillation, chronic   Acute on chronic diastolic CHF (congestive heart failure) (HCC)   1. Acute on chronic respiratory failure with hypoxia continue to wean on T collar as tolerated per protocol.  Continue supportive measures and pulmonary toilet. 2. Acute stroke stable unchanged 3. Acute on chronic diastolic heart failure compensated currently 4. Chronic atrial fibrillation rate controlled 5. Tracheostomy working towards capping   I have personally seen and evaluated the patient, evaluated laboratory and imaging results, formulated the assessment and plan and placed orders. The Patient requires high complexity decision making for assessment and support.  Case was discussed on Rounds with the Respiratory Therapy Staff  Yevonne Pax, MD [facility] Pulmonary Critical Care Medicine Sleep  Medicine

## 2018-02-28 DIAGNOSIS — I5033 Acute on chronic diastolic (congestive) heart failure: Secondary | ICD-10-CM | POA: Diagnosis not present

## 2018-02-28 DIAGNOSIS — I63512 Cerebral infarction due to unspecified occlusion or stenosis of left middle cerebral artery: Secondary | ICD-10-CM | POA: Diagnosis not present

## 2018-02-28 DIAGNOSIS — J9601 Acute respiratory failure with hypoxia: Secondary | ICD-10-CM | POA: Diagnosis not present

## 2018-02-28 DIAGNOSIS — I482 Chronic atrial fibrillation, unspecified: Secondary | ICD-10-CM | POA: Diagnosis not present

## 2018-02-28 NOTE — Progress Notes (Addendum)
Pulmonary Critical Care Medicine [facility] [place]   PULMONARY CRITICAL CARE SERVICE  PROGRESS NOTE  Date of Service: 02/28/2018  Kelli Vazquez  JQB:[id number]  DOB: 04-24-1929   DOA: 02/19/2018  Referring Physician: Carron Curie, MD  HPI: Kelli Vazquez is a 85 y.o. female seen for follow up of Acute on Chronic Respiratory Failure.  Patient did well yesterday on 16 hours of trach collar 28% FiO2.  Goal today is 20 hours.  Minimal secretions noted this time patient is currently doing well.  Medications: Reviewed on Rounds  Physical Exam:  Vitals: Pulse 84 respirations 24 BP 132/73 O2 sat 100% temp 96.9  Ventilator Settings patient is not currently on ventilator however will rest on the ventilator overnight at a AC VC tidal volume 320 rate of 16 PEEP of 5, FiO2 28%.  . General: Comfortable at this time . Eyes: Grossly normal lids, irises & conjunctiva . ENT: grossly tongue is normal . Neck: no obvious mass . Cardiovascular: S1 S2 normal no gallop . Respiratory: No rales or rhonchi noted . Abdomen: soft . Skin: no rash seen on limited exam . Musculoskeletal: not rigid . Psychiatric:unable to assess . Neurologic: no seizure no involuntary movements         Lab Data:   Basic Metabolic Panel: Recent Labs  Lab 02/23/18 0522 02/27/18 0546  NA 143 135  K 3.7 3.8  CL 96* 93*  CO2 37* 30  GLUCOSE 153* 126*  BUN 33* 43*  CREATININE 0.91 1.20*  CALCIUM 8.3* 10.0  MG 2.4 2.1  PHOS 3.4  --     ABG: No results for input(s): PHART, PCO2ART, PO2ART, HCO3, O2SAT in the last 168 hours.  Liver Function Tests: No results for input(s): AST, ALT, ALKPHOS, BILITOT, PROT, ALBUMIN in the last 168 hours. No results for input(s): LIPASE, AMYLASE in the last 168 hours. No results for input(s): AMMONIA in the last 168 hours.  CBC: Recent Labs  Lab 02/23/18 0522 02/25/18 0542 02/27/18 0546  WBC 12.4* 10.7* 8.8  HGB 10.5* 11.2* 10.7*  HCT 34.3* 37.2 35.3*  MCV 93.7  93.7 97.2  PLT 300 283 252    Cardiac Enzymes: No results for input(s): CKTOTAL, CKMB, CKMBINDEX, TROPONINI in the last 168 hours.  BNP (last 3 results) No results for input(s): BNP in the last 8760 hours.  ProBNP (last 3 results) No results for input(s): PROBNP in the last 8760 hours.  Radiological Exams: No results found.  Assessment/Plan Active Problems:   Acute ischemic left MCA stroke (HCC)   Acute respiratory failure with hypoxemia (HCC)   Status post tracheostomy (HCC)   Atrial fibrillation, chronic   Acute on chronic diastolic CHF (congestive heart failure) (HCC)   1. Acute on chronic respiratory failure with hypoxia continue to wean on T collar as tolerated protocol.  Continue supportive measures and aggressive pulmonary toilet. 2. Acute stroke stable unchanged 3. Acute on chronic diastolic heart failure compensated 4. Chronic atrial fibrillation rate controlled 5. Tracheostomy working with capping   I have personally seen and evaluated the patient, evaluated laboratory and imaging results, formulated the assessment and plan and placed orders. The Patient requires high complexity decision making for assessment and support.  Case was discussed on Rounds with the Respiratory Therapy Staff  Yevonne Pax, MD [facility] Pulmonary Critical Care Medicine Sleep Medicine

## 2018-03-01 DIAGNOSIS — I482 Chronic atrial fibrillation, unspecified: Secondary | ICD-10-CM | POA: Diagnosis not present

## 2018-03-01 DIAGNOSIS — I5033 Acute on chronic diastolic (congestive) heart failure: Secondary | ICD-10-CM | POA: Diagnosis not present

## 2018-03-01 DIAGNOSIS — J9601 Acute respiratory failure with hypoxia: Secondary | ICD-10-CM | POA: Diagnosis not present

## 2018-03-01 DIAGNOSIS — I63512 Cerebral infarction due to unspecified occlusion or stenosis of left middle cerebral artery: Secondary | ICD-10-CM | POA: Diagnosis not present

## 2018-03-01 LAB — BASIC METABOLIC PANEL
Anion gap: 10 (ref 5–15)
BUN: 36 mg/dL — AB (ref 8–23)
CO2: 37 mmol/L — AB (ref 22–32)
Calcium: 8.4 mg/dL — ABNORMAL LOW (ref 8.9–10.3)
Chloride: 104 mmol/L (ref 98–111)
Creatinine, Ser: 0.85 mg/dL (ref 0.44–1.00)
GFR calc Af Amer: >60 mL/min (ref >60)
GFR calc non Af Amer: >60 mL/min (ref >60)
Glucose, Bld: 143 mg/dL — ABNORMAL HIGH (ref 70–99)
Potassium: 2.9 mmol/L — ABNORMAL LOW (ref 3.5–5.1)
Sodium: 151 mmol/L — ABNORMAL HIGH (ref 135–145)

## 2018-03-01 NOTE — Progress Notes (Signed)
Pulmonary Critical Care Medicine [facility] [place]   PULMONARY CRITICAL CARE SERVICE  PROGRESS NOTE  Date of Service: 03/01/2018  [name]  CZY:[id number]  DOB: [birth year]   DOA: 02/19/2018  Referring Physician: Carron Curie, MD  HPI: Kelli Vazquez is a 85 y.o. female seen for follow up of Acute on Chronic Respiratory Failure.  Currently patient is on T collar the goal is for 24 hours today  Medications: Reviewed on Rounds  Physical Exam:  Vitals: Temperature 96.6 pulse 73 respiratory rate 21 blood pressure 165/88 saturations 100%  Ventilator Settings T collar FiO2 28%  . General: Comfortable at this time . Eyes: Grossly normal lids, irises & conjunctiva . ENT: grossly tongue is normal . Neck: no obvious mass . Cardiovascular: S1 S2 normal no gallop . Respiratory: No rhonchi or rales are noted . Abdomen: soft . Skin: no rash seen on limited exam . Musculoskeletal: not rigid . Psychiatric:unable to assess . Neurologic: no seizure no involuntary movements         Lab Data:   Basic Metabolic Panel: Recent Labs  Lab 02/23/18 0522 02/27/18 0546 03/01/18 0616  NA 143 135 151*  K 3.7 3.8 2.9*  CL 96* 93* 104  CO2 37* 30 37*  GLUCOSE 153* 126* 143*  BUN 33* 43* 36*  CREATININE 0.91 1.20* 0.85  CALCIUM 8.3* 10.0 8.4*  MG 2.4 2.1  --   PHOS 3.4  --   --     ABG: No results for input(s): PHART, PCO2ART, PO2ART, HCO3, O2SAT in the last 168 hours.  Liver Function Tests: No results for input(s): AST, ALT, ALKPHOS, BILITOT, PROT, ALBUMIN in the last 168 hours. No results for input(s): LIPASE, AMYLASE in the last 168 hours. No results for input(s): AMMONIA in the last 168 hours.  CBC: Recent Labs  Lab 02/23/18 0522 02/25/18 0542 02/27/18 0546  WBC 12.4* 10.7* 8.8  HGB 10.5* 11.2* 10.7*  HCT 34.3* 37.2 35.3*  MCV 93.7 93.7 97.2  PLT 300 283 252    Cardiac Enzymes: No results for input(s): CKTOTAL, CKMB, CKMBINDEX, TROPONINI in the last 168  hours.  BNP (last 3 results) No results for input(s): BNP in the last 8760 hours.  ProBNP (last 3 results) No results for input(s): PROBNP in the last 8760 hours.  Radiological Exams: No results found.  Assessment/Plan Active Problems:   Acute ischemic left MCA stroke (HCC)   Acute respiratory failure with hypoxemia (HCC)   Status post tracheostomy (HCC)   Atrial fibrillation, chronic   Acute on chronic diastolic CHF (congestive heart failure) (HCC)   1. Acute on chronic respiratory failure with hypoxia we will continue with T collar trials continue pulmonary toilet supportive care 2. Acute stroke continue with supportive care she is showing some neurological improvement 3. Chronic atrial fibrillation rate controlled 4. Acute on chronic diastolic heart failure continue supportive care 5. Tracheostomy working towards liberation from the ventilator   I have personally seen and evaluated the patient, evaluated laboratory and imaging results, formulated the assessment and plan and placed orders. The Patient requires high complexity decision making for assessment and support.  Case was discussed on Rounds with the Respiratory Therapy Staff  Yevonne Pax, MD [facility] Pulmonary Critical Care Medicine Sleep Medicine

## 2018-03-02 DIAGNOSIS — I63512 Cerebral infarction due to unspecified occlusion or stenosis of left middle cerebral artery: Secondary | ICD-10-CM | POA: Diagnosis not present

## 2018-03-02 DIAGNOSIS — I482 Chronic atrial fibrillation, unspecified: Secondary | ICD-10-CM | POA: Diagnosis not present

## 2018-03-02 DIAGNOSIS — I5033 Acute on chronic diastolic (congestive) heart failure: Secondary | ICD-10-CM | POA: Diagnosis not present

## 2018-03-02 DIAGNOSIS — J9601 Acute respiratory failure with hypoxia: Secondary | ICD-10-CM | POA: Diagnosis not present

## 2018-03-02 LAB — BASIC METABOLIC PANEL
Anion gap: 7 (ref 5–15)
BUN: 29 mg/dL — ABNORMAL HIGH (ref 8–23)
CHLORIDE: 100 mmol/L (ref 98–111)
CO2: 38 mmol/L — ABNORMAL HIGH (ref 22–32)
Calcium: 8.4 mg/dL — ABNORMAL LOW (ref 8.9–10.3)
Creatinine, Ser: 0.79 mg/dL (ref 0.44–1.00)
GFR calc Af Amer: >60 mL/min (ref >60)
GFR calc non Af Amer: >60 mL/min (ref >60)
Glucose, Bld: 132 mg/dL — ABNORMAL HIGH (ref 70–99)
Potassium: 3.8 mmol/L (ref 3.5–5.1)
SODIUM: 145 mmol/L (ref 135–145)

## 2018-03-02 LAB — POTASSIUM: Potassium: 3.6 mmol/L (ref 3.5–5.1)

## 2018-03-02 NOTE — Progress Notes (Addendum)
Pulmonary Critical Care Medicine [place]   PULMONARY CRITICAL CARE SERVICE  PROGRESS NOTE  Date of Service: 03/02/2018  Mahalia Dykes  WHQ:[id number]  DOB: 12-12-1929   DOA: 02/19/2018  Referring Physician: Merton Border, MD  HPI: Yoceline Bazar is a 85 y.o. female seen for follow up of Acute on Chronic Respiratory Failure.  Patient has now met goal of 24 hours on ATC with a 28% FiO2.  Continues to do well and will continue with T collar at this time.  Medications: Reviewed on Rounds  Physical Exam:  Vitals: Pulse 86 respirations 20 BP 138/76 O2 sat 100% temp 97.2  Ventilator Settings T collar 28% not on ventilator.  . General: Comfortable at this time . Eyes: Grossly normal lids, irises & conjunctiva . ENT: grossly tongue is normal . Neck: no obvious mass . Cardiovascular: S1 S2 normal no gallop . Respiratory: No rales or rhonchi noted . Abdomen: soft . Skin: no rash seen on limited exam . Musculoskeletal: not rigid . Psychiatric:unable to assess . Neurologic: no seizure no involuntary movements         Lab Data:   Basic Metabolic Panel: Recent Labs  Lab 02/27/18 0546 03/01/18 0616 03/02/18 0027 03/02/18 0536  NA 135 151*  --  145  K 3.8 2.9* 3.6 3.8  CL 93* 104  --  100  CO2 30 37*  --  38*  GLUCOSE 126* 143*  --  132*  BUN 43* 36*  --  29*  CREATININE 1.20* 0.85  --  0.79  CALCIUM 10.0 8.4*  --  8.4*  MG 2.1  --   --   --     ABG: No results for input(s): PHART, PCO2ART, PO2ART, HCO3, O2SAT in the last 168 hours.  Liver Function Tests: No results for input(s): AST, ALT, ALKPHOS, BILITOT, PROT, ALBUMIN in the last 168 hours. No results for input(s): LIPASE, AMYLASE in the last 168 hours. No results for input(s): AMMONIA in the last 168 hours.  CBC: Recent Labs  Lab 02/25/18 0542 02/27/18 0546  WBC 10.7* 8.8  HGB 11.2* 10.7*  HCT 37.2 35.3*  MCV 93.7 97.2  PLT 283 252    Cardiac Enzymes: No results for input(s): CKTOTAL,  CKMB, CKMBINDEX, TROPONINI in the last 168 hours.  BNP (last 3 results) No results for input(s): BNP in the last 8760 hours.  ProBNP (last 3 results) No results for input(s): PROBNP in the last 8760 hours.  Radiological Exams: No results found.  Assessment/Plan Active Problems:   Acute ischemic left MCA stroke (HCC)   Acute respiratory failure with hypoxemia (HCC)   Status post tracheostomy (HCC)   Atrial fibrillation, chronic   Acute on chronic diastolic CHF (congestive heart failure) ([place])   1. Acute on chronic respiratory failure with hypoxia continue with T collar trials continue pulmonary toilet and supportive care. 2. Acute stroke continue with supportive care some neurological improvement noted 3. Chronic atrial fibrillation rate controlled 4. Acute on chronic diastolic heart failure continue supportive care 5. Tracheostomy working towards the ventilator liberation   I have personally seen and evaluated the patient, evaluated laboratory and imaging results, formulated the assessment and plan and placed orders. The Patient requires high complexity decision making for assessment and support.  Case was discussed on Rounds with the Respiratory Therapy Staff  Allyne Gee, MD [facility] Pulmonary Critical Care Medicine Sleep Medicine

## 2018-03-03 DIAGNOSIS — I5033 Acute on chronic diastolic (congestive) heart failure: Secondary | ICD-10-CM | POA: Diagnosis not present

## 2018-03-03 DIAGNOSIS — I482 Chronic atrial fibrillation, unspecified: Secondary | ICD-10-CM | POA: Diagnosis not present

## 2018-03-03 DIAGNOSIS — J9601 Acute respiratory failure with hypoxia: Secondary | ICD-10-CM | POA: Diagnosis not present

## 2018-03-03 DIAGNOSIS — I63512 Cerebral infarction due to unspecified occlusion or stenosis of left middle cerebral artery: Secondary | ICD-10-CM | POA: Diagnosis not present

## 2018-03-03 NOTE — Progress Notes (Addendum)
Pulmonary Critical Care Medicine [facility] [place]   PULMONARY CRITICAL CARE SERVICE  PROGRESS NOTE  Date of Service: 03/03/2018  Kelli Vazquez  OFB:[id number]  DOB: 1929-06-17   DOA: 02/19/2018  Referring Physician: Carron Curie, MD  HPI: Kelli Vazquez is a 85 y.o. female seen for follow up of Acute on Chronic Respiratory Failure.  Patient has now done 48 hours on T collar at 21% FiO2.  Ventilator has been removed from the room and patient continues to have minimal secretions doing well at this time.   Medications: Reviewed on Rounds  Physical Exam:  Vitals:  Pulse 94 respirations 29 BP 150/88 O2 sat 92% temp 98.3  Ventilator Settings not currently on ventilator  . General: Comfortable at this time . Eyes: Grossly normal lids, irises & conjunctiva . ENT: grossly tongue is normal . Neck: no obvious mass . Cardiovascular: S1 S2 normal no gallop . Respiratory: No rales or rhonchi noted . Abdomen: soft . Skin: no rash seen on limited exam . Musculoskeletal: not rigid . Psychiatric:unable to assess . Neurologic: no seizure no involuntary movements         Lab Data:   Basic Metabolic Panel: Recent Labs  Lab 02/27/18 0546 03/01/18 0616 03/02/18 0027 03/02/18 0536  NA 135 151*  --  145  K 3.8 2.9* 3.6 3.8  CL 93* 104  --  100  CO2 30 37*  --  38*  GLUCOSE 126* 143*  --  132*  BUN 43* 36*  --  29*  CREATININE 1.20* 0.85  --  0.79  CALCIUM 10.0 8.4*  --  8.4*  MG 2.1  --   --   --     ABG: No results for input(s): PHART, PCO2ART, PO2ART, HCO3, O2SAT in the last 168 hours.  Liver Function Tests: No results for input(s): AST, ALT, ALKPHOS, BILITOT, PROT, ALBUMIN in the last 168 hours. No results for input(s): LIPASE, AMYLASE in the last 168 hours. No results for input(s): AMMONIA in the last 168 hours.  CBC: Recent Labs  Lab 02/25/18 0542 02/27/18 0546  WBC 10.7* 8.8  HGB 11.2* 10.7*  HCT 37.2 35.3*  MCV 93.7 97.2  PLT 283 252    Cardiac  Enzymes: No results for input(s): CKTOTAL, CKMB, CKMBINDEX, TROPONINI in the last 168 hours.  BNP (last 3 results) No results for input(s): BNP in the last 8760 hours.  ProBNP (last 3 results) No results for input(s): PROBNP in the last 8760 hours.  Radiological Exams: No results found.  Assessment/Plan Active Problems:   Acute ischemic left MCA stroke (HCC)   Acute respiratory failure with hypoxemia (HCC)   Status post tracheostomy (HCC)   Atrial fibrillation, chronic   Acute on chronic diastolic CHF (congestive heart failure) (HCC)   1. Acute on chronic respiratory failure with hypoxia continue with T collar trials continue pulmonary toilet and supportive care. 2. Acute stroke continue supportive care patient has minimal neurological improvement. 3. Chronic atrial fibrillation rate controlled 4. Acute on chronic diastolic heart failure continue supportive care 5. Tracheostomy mains in place, no longer vent dependent.   I have personally seen and evaluated the patient, evaluated laboratory and imaging results, formulated the assessment and plan and placed orders. The Patient requires high complexity decision making for assessment and support.  Case was discussed on Rounds with the Respiratory Therapy Staff  Yevonne Pax, MD [facility] Pulmonary Critical Care Medicine Sleep Medicine

## 2018-03-04 DIAGNOSIS — J9601 Acute respiratory failure with hypoxia: Secondary | ICD-10-CM | POA: Diagnosis not present

## 2018-03-04 DIAGNOSIS — I63512 Cerebral infarction due to unspecified occlusion or stenosis of left middle cerebral artery: Secondary | ICD-10-CM | POA: Diagnosis not present

## 2018-03-04 DIAGNOSIS — I5033 Acute on chronic diastolic (congestive) heart failure: Secondary | ICD-10-CM | POA: Diagnosis not present

## 2018-03-04 DIAGNOSIS — I482 Chronic atrial fibrillation, unspecified: Secondary | ICD-10-CM | POA: Diagnosis not present

## 2018-03-04 LAB — CBC
HCT: 41.5 % (ref 36.0–46.0)
HEMOGLOBIN: 12.5 g/dL (ref 12.0–15.0)
MCH: 28.1 pg (ref 26.0–34.0)
MCHC: 30.1 g/dL (ref 30.0–36.0)
MCV: 93.3 fL (ref 80.0–100.0)
Platelets: 338 10*3/uL (ref 150–400)
RBC: 4.45 MIL/uL (ref 3.87–5.11)
RDW: 16.4 % — ABNORMAL HIGH (ref 11.5–15.5)
WBC: 10.4 10*3/uL (ref 4.0–10.5)
nRBC: 0 % (ref 0.0–0.2)

## 2018-03-04 LAB — COMPREHENSIVE METABOLIC PANEL
ALT: 95 U/L — ABNORMAL HIGH (ref 0–44)
AST: 81 U/L — ABNORMAL HIGH (ref 15–41)
Albumin: 2.1 g/dL — ABNORMAL LOW (ref 3.5–5.0)
Alkaline Phosphatase: 211 U/L — ABNORMAL HIGH (ref 38–126)
Anion gap: 7 (ref 5–15)
BUN: 25 mg/dL — ABNORMAL HIGH (ref 8–23)
CALCIUM: 8.5 mg/dL — AB (ref 8.9–10.3)
CO2: 30 mmol/L (ref 22–32)
Chloride: 105 mmol/L (ref 98–111)
Creatinine, Ser: 0.9 mg/dL (ref 0.44–1.00)
GFR calc Af Amer: >60 mL/min (ref >60)
GFR calc non Af Amer: 58 mL/min — ABNORMAL LOW (ref >60)
Glucose, Bld: 125 mg/dL — ABNORMAL HIGH (ref 70–99)
Potassium: 4 mmol/L (ref 3.5–5.1)
Sodium: 142 mmol/L (ref 135–145)
Total Bilirubin: 0.4 mg/dL (ref 0.3–1.2)
Total Protein: 6.4 g/dL — ABNORMAL LOW (ref 6.5–8.1)

## 2018-03-04 LAB — MAGNESIUM: Magnesium: 2.6 mg/dL — ABNORMAL HIGH (ref 1.7–2.4)

## 2018-03-04 NOTE — Progress Notes (Addendum)
Pulmonary Critical Care Medicine [facility] [place]   PULMONARY CRITICAL CARE SERVICE  PROGRESS NOTE  Date of Service: 03/04/2018  Kelli Vazquez  BOF:[id number]  DOB: 20-Jul-1929   DOA: 02/19/2018  Referring Physician: Carron Curie, MD  HPI: Kelli Vazquez is a 85 y.o. female seen for follow up of Acute on Chronic Respiratory Failure.  Patient managed 48 hours on T collar 28% FiO2.  Today her FiO2 was weaned to 21% and is currently doing well at this time.  Medications: Reviewed on Rounds  Physical Exam:  Vitals: Pulse 93 respirations 26 BP 145/83 O2 sat 98% temp 97.0  Ventilator Settings not currently on ventilator  . General: Comfortable at this time . Eyes: Grossly normal lids, irises & conjunctiva . ENT: grossly tongue is normal . Neck: no obvious mass . Cardiovascular: S1 S2 normal no gallop . Respiratory: No rales or rhonchi noted . Abdomen: soft . Skin: no rash seen on limited exam . Musculoskeletal: not rigid . Psychiatric:unable to assess . Neurologic: no seizure no involuntary movements         Lab Data:   Basic Metabolic Panel: Recent Labs  Lab 02/27/18 0546 03/01/18 0616 03/02/18 0027 03/02/18 0536 03/04/18 0502  NA 135 151*  --  145 142  K 3.8 2.9* 3.6 3.8 4.0  CL 93* 104  --  100 105  CO2 30 37*  --  38* 30  GLUCOSE 126* 143*  --  132* 125*  BUN 43* 36*  --  29* 25*  CREATININE 1.20* 0.85  --  0.79 0.90  CALCIUM 10.0 8.4*  --  8.4* 8.5*  MG 2.1  --   --   --  2.6*    ABG: No results for input(s): PHART, PCO2ART, PO2ART, HCO3, O2SAT in the last 168 hours.  Liver Function Tests: Recent Labs  Lab 03/04/18 0502  AST 81*  ALT 95*  ALKPHOS 211*  BILITOT 0.4  PROT 6.4*  ALBUMIN 2.1*   No results for input(s): LIPASE, AMYLASE in the last 168 hours. No results for input(s): AMMONIA in the last 168 hours.  CBC: Recent Labs  Lab 02/27/18 0546 03/04/18 0502  WBC 8.8 10.4  HGB 10.7* 12.5  HCT 35.3* 41.5  MCV 97.2 93.3  PLT 252  338    Cardiac Enzymes: No results for input(s): CKTOTAL, CKMB, CKMBINDEX, TROPONINI in the last 168 hours.  BNP (last 3 results) No results for input(s): BNP in the last 8760 hours.  ProBNP (last 3 results) No results for input(s): PROBNP in the last 8760 hours.  Radiological Exams: No results found.  Assessment/Plan Active Problems:   Acute ischemic left MCA stroke (HCC)   Acute respiratory failure with hypoxemia (HCC)   Status post tracheostomy (HCC)   Atrial fibrillation, chronic   Acute on chronic diastolic CHF (congestive heart failure) (HCC)   1. Acute on chronic respiratory failure with hypoxia continue with T collar trials and pulmonary toilet as well as supportive care. 2. Acute stroke continue supportive care patient has minimal neurological improvement 3. Chronic atrial fibrillation rate controlled 4. Acute on chronic diastolic heart failure continue supportive care 5. Tracheostomy remains in place no longer vent dependent   I have personally seen and evaluated the patient, evaluated laboratory and imaging results, formulated the assessment and plan and placed orders. The Patient requires high complexity decision making for assessment and support.  Case was discussed on Rounds with the Respiratory Therapy Staff  Yevonne Pax, MD [facility] Pulmonary Critical Care  Medicine Sleep Medicine

## 2018-03-05 DIAGNOSIS — I5033 Acute on chronic diastolic (congestive) heart failure: Secondary | ICD-10-CM | POA: Diagnosis not present

## 2018-03-05 DIAGNOSIS — I482 Chronic atrial fibrillation, unspecified: Secondary | ICD-10-CM | POA: Diagnosis not present

## 2018-03-05 DIAGNOSIS — J9601 Acute respiratory failure with hypoxia: Secondary | ICD-10-CM | POA: Diagnosis not present

## 2018-03-05 DIAGNOSIS — I63512 Cerebral infarction due to unspecified occlusion or stenosis of left middle cerebral artery: Secondary | ICD-10-CM | POA: Diagnosis not present

## 2018-03-05 NOTE — Progress Notes (Signed)
Pulmonary Critical Care Medicine [facility] [place]   PULMONARY CRITICAL CARE SERVICE  PROGRESS NOTE  Date of Service: 03/05/2018  Kelli Vazquez  YEM:[id number]  DOB: 17-Dec-1929   DOA: 02/19/2018  Referring Physician: Carron Curie, MD  HPI: Kelli Vazquez is a 85 y.o. female seen for follow up of Acute on Chronic Respiratory Failure.  She is off the ventilator for several days now has been on room air  Medications: Reviewed on Rounds  Physical Exam:  Vitals: Temperature 97.6 pulse 90 respiratory 24 blood pressure 97/65 saturations 97%  Ventilator Settings on T collar room air  . General: Comfortable at this time . Eyes: Grossly normal lids, irises & conjunctiva . ENT: grossly tongue is normal . Neck: no obvious mass . Cardiovascular: S1 S2 normal no gallop . Respiratory: No rhonchi or rales . Abdomen: soft . Skin: no rash seen on limited exam . Musculoskeletal: not rigid . Psychiatric:unable to assess . Neurologic: no seizure no involuntary movements         Lab Data:   Basic Metabolic Panel: Recent Labs  Lab 02/27/18 0546 03/01/18 0616 03/02/18 0027 03/02/18 0536 03/04/18 0502  NA 135 151*  --  145 142  K 3.8 2.9* 3.6 3.8 4.0  CL 93* 104  --  100 105  CO2 30 37*  --  38* 30  GLUCOSE 126* 143*  --  132* 125*  BUN 43* 36*  --  29* 25*  CREATININE 1.20* 0.85  --  0.79 0.90  CALCIUM 10.0 8.4*  --  8.4* 8.5*  MG 2.1  --   --   --  2.6*    ABG: No results for input(s): PHART, PCO2ART, PO2ART, HCO3, O2SAT in the last 168 hours.  Liver Function Tests: Recent Labs  Lab 03/04/18 0502  AST 81*  ALT 95*  ALKPHOS 211*  BILITOT 0.4  PROT 6.4*  ALBUMIN 2.1*   No results for input(s): LIPASE, AMYLASE in the last 168 hours. No results for input(s): AMMONIA in the last 168 hours.  CBC: Recent Labs  Lab 02/27/18 0546 03/04/18 0502  WBC 8.8 10.4  HGB 10.7* 12.5  HCT 35.3* 41.5  MCV 97.2 93.3  PLT 252 338    Cardiac Enzymes: No results for  input(s): CKTOTAL, CKMB, CKMBINDEX, TROPONINI in the last 168 hours.  BNP (last 3 results) No results for input(s): BNP in the last 8760 hours.  ProBNP (last 3 results) No results for input(s): PROBNP in the last 8760 hours.  Radiological Exams: No results found.  Assessment/Plan Active Problems:   Acute ischemic left MCA stroke (HCC)   Acute respiratory failure with hypoxemia (HCC)   Status post tracheostomy (HCC)   Atrial fibrillation, chronic   Acute on chronic diastolic CHF (congestive heart failure) (HCC)   1. Acute on chronic respiratory failure with hypoxia patient is actually doing well we will change her over to a cuffless #6 trach at this point 2. Acute stroke grossly unchanged 3. Status post tracheostomy working towards capping soon 4. Chronic atrial fibrillation rate controlled 5. Acute on chronic diastolic heart failure at baseline we will continue with supportive care   I have personally seen and evaluated the patient, evaluated laboratory and imaging results, formulated the assessment and plan and placed orders. The Patient requires high complexity decision making for assessment and support.  Case was discussed on Rounds with the Respiratory Therapy Staff  Yevonne Pax, MD [facility] Pulmonary Critical Care Medicine Sleep Medicine

## 2018-03-06 DIAGNOSIS — J9601 Acute respiratory failure with hypoxia: Secondary | ICD-10-CM | POA: Diagnosis not present

## 2018-03-06 DIAGNOSIS — I5033 Acute on chronic diastolic (congestive) heart failure: Secondary | ICD-10-CM | POA: Diagnosis not present

## 2018-03-06 DIAGNOSIS — I482 Chronic atrial fibrillation, unspecified: Secondary | ICD-10-CM | POA: Diagnosis not present

## 2018-03-06 DIAGNOSIS — I63512 Cerebral infarction due to unspecified occlusion or stenosis of left middle cerebral artery: Secondary | ICD-10-CM | POA: Diagnosis not present

## 2018-03-06 NOTE — Progress Notes (Signed)
Pulmonary Critical Care Medicine [facility] [place]   PULMONARY CRITICAL CARE SERVICE  PROGRESS NOTE  Date of Service: 03/06/2018  Taliya Koffel  AJO:[id number]  DOB: July 14, 1929   DOA: 02/19/2018  Referring Physician: Carron Curie, MD  HPI: Kelli Vazquez is a 85 y.o. female seen for follow up of Acute on Chronic Respiratory Failure.  Patient is on T collar had the tracheostomy changed out last night doing well  Medications: Reviewed on Rounds  Physical Exam:  Vitals: Temperature 97.6 pulse 86 respiratory 22 blood pressure 164/70 saturations 98%  Ventilator Settings currently on T collar room air  . General: Comfortable at this time . Eyes: Grossly normal lids, irises & conjunctiva . ENT: grossly tongue is normal . Neck: no obvious mass . Cardiovascular: S1 S2 normal no gallop . Respiratory: No rhonchi or rales are noted at this time . Abdomen: soft . Skin: no rash seen on limited exam . Musculoskeletal: not rigid . Psychiatric:unable to assess . Neurologic: no seizure no involuntary movements         Lab Data:   Basic Metabolic Panel: Recent Labs  Lab 03/01/18 0616 03/02/18 0027 03/02/18 0536 03/04/18 0502  NA 151*  --  145 142  K 2.9* 3.6 3.8 4.0  CL 104  --  100 105  CO2 37*  --  38* 30  GLUCOSE 143*  --  132* 125*  BUN 36*  --  29* 25*  CREATININE 0.85  --  0.79 0.90  CALCIUM 8.4*  --  8.4* 8.5*  MG  --   --   --  2.6*    ABG: No results for input(s): PHART, PCO2ART, PO2ART, HCO3, O2SAT in the last 168 hours.  Liver Function Tests: Recent Labs  Lab 03/04/18 0502  AST 81*  ALT 95*  ALKPHOS 211*  BILITOT 0.4  PROT 6.4*  ALBUMIN 2.1*   No results for input(s): LIPASE, AMYLASE in the last 168 hours. No results for input(s): AMMONIA in the last 168 hours.  CBC: Recent Labs  Lab 03/04/18 0502  WBC 10.4  HGB 12.5  HCT 41.5  MCV 93.3  PLT 338    Cardiac Enzymes: No results for input(s): CKTOTAL, CKMB, CKMBINDEX, TROPONINI in the  last 168 hours.  BNP (last 3 results) No results for input(s): BNP in the last 8760 hours.  ProBNP (last 3 results) No results for input(s): PROBNP in the last 8760 hours.  Radiological Exams: No results found.  Assessment/Plan Active Problems:   Acute ischemic left MCA stroke (HCC)   Acute respiratory failure with hypoxemia (HCC)   Status post tracheostomy (HCC)   Atrial fibrillation, chronic   Acute on chronic diastolic CHF (congestive heart failure) (HCC)   1. Acute on chronic respiratory failure with hypoxia we will continue with T collar trials continue pulmonary toilet supportive care 2. Acute stroke at baseline continue present management 3. Status post tracheostomy grossly unchanged 4. Chronic atrial fibrillation rate is controlled 5. Acute on chronic diastolic heart failure compensated   I have personally seen and evaluated the patient, evaluated laboratory and imaging results, formulated the assessment and plan and placed orders. The Patient requires high complexity decision making for assessment and support.  Case was discussed on Rounds with the Respiratory Therapy Staff  Yevonne Pax, MD [facility] Pulmonary Critical Care Medicine Sleep Medicine

## 2018-03-07 DIAGNOSIS — I482 Chronic atrial fibrillation, unspecified: Secondary | ICD-10-CM | POA: Diagnosis not present

## 2018-03-07 DIAGNOSIS — J9601 Acute respiratory failure with hypoxia: Secondary | ICD-10-CM | POA: Diagnosis not present

## 2018-03-07 DIAGNOSIS — I5033 Acute on chronic diastolic (congestive) heart failure: Secondary | ICD-10-CM | POA: Diagnosis not present

## 2018-03-07 DIAGNOSIS — I63512 Cerebral infarction due to unspecified occlusion or stenosis of left middle cerebral artery: Secondary | ICD-10-CM | POA: Diagnosis not present

## 2018-03-07 NOTE — Progress Notes (Addendum)
Pulmonary Critical Care Medicine [facility] [place]   PULMONARY CRITICAL CARE SERVICE  PROGRESS NOTE  Date of Service: 03/07/2018  Kelli Vazquez  EXH:[id number]  DOB: Jul 26, 1929   DOA: 02/19/2018  Referring Physician: Carron Curie, MD  HPI: Kelli Vazquez is a 85 y.o. female seen for follow up of Acute on Chronic Respiratory Failure.  Patient mains on T collar 21% FiO2 using PMV without difficulty.  Medications: Reviewed on Rounds  Physical Exam:  Vitals: Pulse 100 respirations 24 BP 167/99 O2 sat 90% temp 96.6  Ventilator Settings not currently on ventilator  . General: Comfortable at this time . Eyes: Grossly normal lids, irises & conjunctiva . ENT: grossly tongue is normal . Neck: no obvious mass . Cardiovascular: S1 S2 normal no gallop . Respiratory: No rales or rhonchi noted . Abdomen: soft . Skin: no rash seen on limited exam . Musculoskeletal: not rigid . Psychiatric:unable to assess . Neurologic: no seizure no involuntary movements         Lab Data:   Basic Metabolic Panel: Recent Labs  Lab 03/01/18 0616 03/02/18 0027 03/02/18 0536 03/04/18 0502  NA 151*  --  145 142  K 2.9* 3.6 3.8 4.0  CL 104  --  100 105  CO2 37*  --  38* 30  GLUCOSE 143*  --  132* 125*  BUN 36*  --  29* 25*  CREATININE 0.85  --  0.79 0.90  CALCIUM 8.4*  --  8.4* 8.5*  MG  --   --   --  2.6*    ABG: No results for input(s): PHART, PCO2ART, PO2ART, HCO3, O2SAT in the last 168 hours.  Liver Function Tests: Recent Labs  Lab 03/04/18 0502  AST 81*  ALT 95*  ALKPHOS 211*  BILITOT 0.4  PROT 6.4*  ALBUMIN 2.1*   No results for input(s): LIPASE, AMYLASE in the last 168 hours. No results for input(s): AMMONIA in the last 168 hours.  CBC: Recent Labs  Lab 03/04/18 0502  WBC 10.4  HGB 12.5  HCT 41.5  MCV 93.3  PLT 338    Cardiac Enzymes: No results for input(s): CKTOTAL, CKMB, CKMBINDEX, TROPONINI in the last 168 hours.  BNP (last 3 results) No results for  input(s): BNP in the last 8760 hours.  ProBNP (last 3 results) No results for input(s): PROBNP in the last 8760 hours.  Radiological Exams: No results found.  Assessment/Plan Active Problems:   Acute ischemic left MCA stroke (HCC)   Acute respiratory failure with hypoxemia (HCC)   Status post tracheostomy (HCC)   Atrial fibrillation, chronic   Acute on chronic diastolic CHF (congestive heart failure) (HCC)   1. Acute on chronic respiratory failure with hypoxia we will continue with T collar trials.  Also continue pulmonary toilet and supportive measures. 2. Acute stroke at baseline continue present management 3. Status post tracheostomy grossly unchanged 4. Chronic atrial fibrillation rate is controlled 5. Acute on chronic diastolic heart failure compensated   I have personally seen and evaluated the patient, evaluated laboratory and imaging results, formulated the assessment and plan and placed orders. The Patient requires high complexity decision making for assessment and support.  Case was discussed on Rounds with the Respiratory Therapy Staff  Yevonne Pax, MD [facility] Pulmonary Critical Care Medicine Sleep Medicine

## 2018-03-08 DIAGNOSIS — I482 Chronic atrial fibrillation, unspecified: Secondary | ICD-10-CM | POA: Diagnosis not present

## 2018-03-08 DIAGNOSIS — I63512 Cerebral infarction due to unspecified occlusion or stenosis of left middle cerebral artery: Secondary | ICD-10-CM | POA: Diagnosis not present

## 2018-03-08 DIAGNOSIS — J9601 Acute respiratory failure with hypoxia: Secondary | ICD-10-CM | POA: Diagnosis not present

## 2018-03-08 DIAGNOSIS — I5033 Acute on chronic diastolic (congestive) heart failure: Secondary | ICD-10-CM | POA: Diagnosis not present

## 2018-03-08 LAB — BASIC METABOLIC PANEL WITH GFR
Anion gap: 11 (ref 5–15)
BUN: 44 mg/dL — ABNORMAL HIGH (ref 8–23)
CO2: 27 mmol/L (ref 22–32)
Calcium: 8.8 mg/dL — ABNORMAL LOW (ref 8.9–10.3)
Chloride: 105 mmol/L (ref 98–111)
Creatinine, Ser: 0.98 mg/dL (ref 0.44–1.00)
GFR calc Af Amer: >60 mL/min
GFR calc non Af Amer: 52 mL/min — ABNORMAL LOW
Glucose, Bld: 134 mg/dL — ABNORMAL HIGH (ref 70–99)
Potassium: 4.6 mmol/L (ref 3.5–5.1)
Sodium: 143 mmol/L (ref 135–145)

## 2018-03-08 LAB — CBC
HCT: 43.7 % (ref 36.0–46.0)
Hemoglobin: 13.4 g/dL (ref 12.0–15.0)
MCH: 28.8 pg (ref 26.0–34.0)
MCHC: 30.7 g/dL (ref 30.0–36.0)
MCV: 93.8 fL (ref 80.0–100.0)
Platelets: 364 10*3/uL (ref 150–400)
RBC: 4.66 MIL/uL (ref 3.87–5.11)
RDW: 18 % — ABNORMAL HIGH (ref 11.5–15.5)
WBC: 9.5 10*3/uL (ref 4.0–10.5)
nRBC: 0 % (ref 0.0–0.2)

## 2018-03-08 LAB — MAGNESIUM: Magnesium: 2.7 mg/dL — ABNORMAL HIGH (ref 1.7–2.4)

## 2018-03-08 LAB — PHOSPHORUS: Phosphorus: 3.7 mg/dL (ref 2.5–4.6)

## 2018-03-08 NOTE — Progress Notes (Signed)
Pulmonary Critical Care Medicine [facility] [place]   PULMONARY CRITICAL CARE SERVICE  PROGRESS NOTE  Date of Service: 03/08/2018  Kelli Vazquez  KTG:[id number]  DOB: May 12, 1929   DOA: 02/19/2018  Referring Physician: Carron Curie, MD  HPI: Kelli Vazquez is a 85 y.o. female seen for follow up of Acute on Chronic Respiratory Failure.  Patient is on T collar right now is on room air has been using PMV  Medications: Reviewed on Rounds  Physical Exam:  Vitals: Temperature 97.4 pulse 93 respiratory 35 blood pressure 133/79 saturations 99%  Ventilator Settings patient is on T collar right now on room air  . General: Comfortable at this time . Eyes: Grossly normal lids, irises & conjunctiva . ENT: grossly tongue is normal . Neck: no obvious mass . Cardiovascular: S1 S2 normal no gallop . Respiratory: No rhonchi or rales are noted at this time . Abdomen: soft . Skin: no rash seen on limited exam . Musculoskeletal: not rigid . Psychiatric:unable to assess . Neurologic: no seizure no involuntary movements         Lab Data:   Basic Metabolic Panel: Recent Labs  Lab 03/02/18 0027 03/02/18 0536 03/04/18 0502 03/08/18 0506  NA  --  145 142 143  K 3.6 3.8 4.0 4.6  CL  --  100 105 105  CO2  --  38* 30 27  GLUCOSE  --  132* 125* 134*  BUN  --  29* 25* 44*  CREATININE  --  0.79 0.90 0.98  CALCIUM  --  8.4* 8.5* 8.8*  MG  --   --  2.6* 2.7*  PHOS  --   --   --  3.7    ABG: No results for input(s): PHART, PCO2ART, PO2ART, HCO3, O2SAT in the last 168 hours.  Liver Function Tests: Recent Labs  Lab 03/04/18 0502  AST 81*  ALT 95*  ALKPHOS 211*  BILITOT 0.4  PROT 6.4*  ALBUMIN 2.1*   No results for input(s): LIPASE, AMYLASE in the last 168 hours. No results for input(s): AMMONIA in the last 168 hours.  CBC: Recent Labs  Lab 03/04/18 0502 03/08/18 0506  WBC 10.4 9.5  HGB 12.5 13.4  HCT 41.5 43.7  MCV 93.3 93.8  PLT 338 364    Cardiac Enzymes: No  results for input(s): CKTOTAL, CKMB, CKMBINDEX, TROPONINI in the last 168 hours.  BNP (last 3 results) No results for input(s): BNP in the last 8760 hours.  ProBNP (last 3 results) No results for input(s): PROBNP in the last 8760 hours.  Radiological Exams: No results found.  Assessment/Plan Active Problems:   Acute ischemic left MCA stroke (HCC)   Acute respiratory failure with hypoxemia (HCC)   Status post tracheostomy (HCC)   Atrial fibrillation, chronic   Acute on chronic diastolic CHF (congestive heart failure) (HCC)   1. Acute on chronic respiratory failure with hypoxia doing well with T collar was advanced to capping today 2. Acute stroke she is showing some improvement continue with supportive care 3. Status post tracheostomy will begin capping trials 4. Chronic atrial fibrillation rate controlled 5. Acute on chronic diastolic heart failure right now is compensated   I have personally seen and evaluated the patient, evaluated laboratory and imaging results, formulated the assessment and plan and placed orders. The Patient requires high complexity decision making for assessment and support.  Case was discussed on Rounds with the Respiratory Therapy Staff  Yevonne Pax, MD [facility] Pulmonary Critical Care Medicine Sleep Medicine

## 2018-03-09 DIAGNOSIS — I482 Chronic atrial fibrillation, unspecified: Secondary | ICD-10-CM | POA: Diagnosis not present

## 2018-03-09 DIAGNOSIS — I63512 Cerebral infarction due to unspecified occlusion or stenosis of left middle cerebral artery: Secondary | ICD-10-CM | POA: Diagnosis not present

## 2018-03-09 DIAGNOSIS — I5033 Acute on chronic diastolic (congestive) heart failure: Secondary | ICD-10-CM | POA: Diagnosis not present

## 2018-03-09 DIAGNOSIS — J9601 Acute respiratory failure with hypoxia: Secondary | ICD-10-CM | POA: Diagnosis not present

## 2018-03-09 NOTE — Progress Notes (Addendum)
Pulmonary Critical Care Medicine [facility] [place]   PULMONARY CRITICAL CARE SERVICE  PROGRESS NOTE  Date of Service: 03/09/2018  Kelli Vazquez  QPY:[id number]  DOB: 03-21-1929   DOA: 02/19/2018  Referring Physician: Carron Curie, MD  HPI: Kelli Vazquez is a 85 y.o. female seen for follow up of Acute on Chronic Respiratory Failure.  Patient doing well has not been capped for 3 days.  She remains on room air and is doing well at this time.  Very minimal secretions are noted.   Medications: Reviewed on Rounds  Physical Exam:  Vitals: Pulse 82 respirations 26 BP 136/75 O2 sat 99% temp 97.1  Ventilator Settings patient is not currently on ventilator  . General: Comfortable at this time . Eyes: Grossly normal lids, irises & conjunctiva . ENT: grossly tongue is normal . Neck: no obvious mass . Cardiovascular: S1 S2 normal no gallop . Respiratory: No rales or rhonchi noted . Abdomen: soft . Skin: no rash seen on limited exam . Musculoskeletal: not rigid . Psychiatric:unable to assess . Neurologic: no seizure no involuntary movements         Lab Data:   Basic Metabolic Panel: Recent Labs  Lab 03/04/18 0502 03/08/18 0506  NA 142 143  K 4.0 4.6  CL 105 105  CO2 30 27  GLUCOSE 125* 134*  BUN 25* 44*  CREATININE 0.90 0.98  CALCIUM 8.5* 8.8*  MG 2.6* 2.7*  PHOS  --  3.7    ABG: No results for input(s): PHART, PCO2ART, PO2ART, HCO3, O2SAT in the last 168 hours.  Liver Function Tests: Recent Labs  Lab 03/04/18 0502  AST 81*  ALT 95*  ALKPHOS 211*  BILITOT 0.4  PROT 6.4*  ALBUMIN 2.1*   No results for input(s): LIPASE, AMYLASE in the last 168 hours. No results for input(s): AMMONIA in the last 168 hours.  CBC: Recent Labs  Lab 03/04/18 0502 03/08/18 0506  WBC 10.4 9.5  HGB 12.5 13.4  HCT 41.5 43.7  MCV 93.3 93.8  PLT 338 364    Cardiac Enzymes: No results for input(s): CKTOTAL, CKMB, CKMBINDEX, TROPONINI in the last 168 hours.  BNP (last  3 results) No results for input(s): BNP in the last 8760 hours.  ProBNP (last 3 results) No results for input(s): PROBNP in the last 8760 hours.  Radiological Exams: No results found.  Assessment/Plan Active Problems:   Acute ischemic left MCA stroke (HCC)   Acute respiratory failure with hypoxemia (HCC)   Status post tracheostomy (HCC)   Atrial fibrillation, chronic   Acute on chronic diastolic CHF (congestive heart failure) (HCC)   1. Acute on chronic respiratory failure with oxygen doing well with capping on room air at this time. 2. Acute stroke showing some improvement continue supportive care 3. Status post tracheostomy patient is doing well with capping will consider decannulation in the future. 4. Chronic atrial fibrillation rate controlled 5. Acute on chronic diastolic heart failure compensated   I have personally seen and evaluated the patient, evaluated laboratory and imaging results, formulated the assessment and plan and placed orders. The Patient requires high complexity decision making for assessment and support.  Case was discussed on Rounds with the Respiratory Therapy Staff  Yevonne Pax, MD [facility] Pulmonary Critical Care Medicine Sleep Medicine

## 2018-03-10 DIAGNOSIS — J9601 Acute respiratory failure with hypoxia: Secondary | ICD-10-CM | POA: Diagnosis not present

## 2018-03-10 DIAGNOSIS — I482 Chronic atrial fibrillation, unspecified: Secondary | ICD-10-CM | POA: Diagnosis not present

## 2018-03-10 DIAGNOSIS — I5033 Acute on chronic diastolic (congestive) heart failure: Secondary | ICD-10-CM | POA: Diagnosis not present

## 2018-03-10 DIAGNOSIS — I63512 Cerebral infarction due to unspecified occlusion or stenosis of left middle cerebral artery: Secondary | ICD-10-CM | POA: Diagnosis not present

## 2018-03-10 NOTE — Progress Notes (Addendum)
Pulmonary Critical Care Medicine [facility] [place]   PULMONARY CRITICAL CARE SERVICE  PROGRESS NOTE  Date of Service: 03/10/2018  Kelli Vazquez  SNK:[id number]  DOB: 02-11-1929   DOA: 02/19/2018  Referring Physician: Carron Curie, MD  HPI: Kelli Vazquez is a 85 y.o. female seen for follow up of Acute on Chronic Respiratory Failure.  Patient has now been capped for 2 days and remains on room air at this time.  No acute distress is noted.  Medications: Reviewed on Rounds  Physical Exam:  Vitals: Pulse 96 respirations 32 BP 112/54 O2 sat 96% temp 97.6  Ventilator Settings not currently on ventilator  . General: Comfortable at this time . Eyes: Grossly normal lids, irises & conjunctiva . ENT: grossly tongue is normal . Neck: no obvious mass . Cardiovascular: S1 S2 normal no gallop . Respiratory: No rales or rhonchi noted . Abdomen: soft . Skin: no rash seen on limited exam . Musculoskeletal: not rigid . Psychiatric:unable to assess . Neurologic: no seizure no involuntary movements         Lab Data:   Basic Metabolic Panel: Recent Labs  Lab 03/04/18 0502 03/08/18 0506  NA 142 143  K 4.0 4.6  CL 105 105  CO2 30 27  GLUCOSE 125* 134*  BUN 25* 44*  CREATININE 0.90 0.98  CALCIUM 8.5* 8.8*  MG 2.6* 2.7*  PHOS  --  3.7    ABG: No results for input(s): PHART, PCO2ART, PO2ART, HCO3, O2SAT in the last 168 hours.  Liver Function Tests: Recent Labs  Lab 03/04/18 0502  AST 81*  ALT 95*  ALKPHOS 211*  BILITOT 0.4  PROT 6.4*  ALBUMIN 2.1*   No results for input(s): LIPASE, AMYLASE in the last 168 hours. No results for input(s): AMMONIA in the last 168 hours.  CBC: Recent Labs  Lab 03/04/18 0502 03/08/18 0506  WBC 10.4 9.5  HGB 12.5 13.4  HCT 41.5 43.7  MCV 93.3 93.8  PLT 338 364    Cardiac Enzymes: No results for input(s): CKTOTAL, CKMB, CKMBINDEX, TROPONINI in the last 168 hours.  BNP (last 3 results) No results for input(s): BNP in the  last 8760 hours.  ProBNP (last 3 results) No results for input(s): PROBNP in the last 8760 hours.  Radiological Exams: No results found.  Assessment/Plan Active Problems:   Acute ischemic left MCA stroke (HCC)   Acute respiratory failure with hypoxemia (HCC)   Status post tracheostomy (HCC)   Atrial fibrillation, chronic   Acute on chronic diastolic CHF (congestive heart failure) (HCC)   1. Acute on chronic respiratory failure with hypoxia doing well with capping on room air at this time. 2. Acute stroke showing some improvement continue supportive care 3. Status post tracheostomy patient is doing well with capping consider decannulation in the future 4. Chronic atrial fibrillation rate controlled 5. Acute on chronic diastolic heart failure compensated   I have personally seen and evaluated the patient, evaluated laboratory and imaging results, formulated the assessment and plan and placed orders. The Patient requires high complexity decision making for assessment and support.  Case was discussed on Rounds with the Respiratory Therapy Staff  Yevonne Pax, MD [facility] Pulmonary Critical Care Medicine Sleep Medicine

## 2018-03-11 DIAGNOSIS — I482 Chronic atrial fibrillation, unspecified: Secondary | ICD-10-CM | POA: Diagnosis not present

## 2018-03-11 DIAGNOSIS — J9601 Acute respiratory failure with hypoxia: Secondary | ICD-10-CM | POA: Diagnosis not present

## 2018-03-11 DIAGNOSIS — I5033 Acute on chronic diastolic (congestive) heart failure: Secondary | ICD-10-CM | POA: Diagnosis not present

## 2018-03-11 DIAGNOSIS — I63512 Cerebral infarction due to unspecified occlusion or stenosis of left middle cerebral artery: Secondary | ICD-10-CM | POA: Diagnosis not present

## 2018-03-11 NOTE — Progress Notes (Signed)
Pulmonary Critical Care Medicine [facility] [place]   PULMONARY CRITICAL CARE SERVICE  PROGRESS NOTE  Date of Service: 03/11/2018  Kelli Vazquez  YOM:[id number]  DOB: 11-Apr-1929   DOA: 02/19/2018  Referring Physician: Carron Curie, MD  HPI: Kelli Vazquez is a 85 y.o. female seen for follow up of Acute on Chronic Respiratory Failure.  Patient is capping doing fairly well secretions are fair to moderate  Medications: Reviewed on Rounds  Physical Exam:  Vitals: Temperature 97.0 pulse 93 respiratory rate 31 blood pressure 169/69 saturations 95%  Ventilator Settings off of the ventilator at this time capping  . General: Comfortable at this time . Eyes: Grossly normal lids, irises & conjunctiva . ENT: grossly tongue is normal . Neck: no obvious mass . Cardiovascular: S1 S2 normal no gallop . Respiratory: No rhonchi or rales are noted . Abdomen: soft . Skin: no rash seen on limited exam . Musculoskeletal: not rigid . Psychiatric:unable to assess . Neurologic: no seizure no involuntary movements         Lab Data:   Basic Metabolic Panel: Recent Labs  Lab 03/08/18 0506  NA 143  K 4.6  CL 105  CO2 27  GLUCOSE 134*  BUN 44*  CREATININE 0.98  CALCIUM 8.8*  MG 2.7*  PHOS 3.7    ABG: No results for input(s): PHART, PCO2ART, PO2ART, HCO3, O2SAT in the last 168 hours.  Liver Function Tests: No results for input(s): AST, ALT, ALKPHOS, BILITOT, PROT, ALBUMIN in the last 168 hours. No results for input(s): LIPASE, AMYLASE in the last 168 hours. No results for input(s): AMMONIA in the last 168 hours.  CBC: Recent Labs  Lab 03/08/18 0506  WBC 9.5  HGB 13.4  HCT 43.7  MCV 93.8  PLT 364    Cardiac Enzymes: No results for input(s): CKTOTAL, CKMB, CKMBINDEX, TROPONINI in the last 168 hours.  BNP (last 3 results) No results for input(s): BNP in the last 8760 hours.  ProBNP (last 3 results) No results for input(s): PROBNP in the last 8760  hours.  Radiological Exams: No results found.  Assessment/Plan Active Problems:   Acute ischemic left MCA stroke (HCC)   Acute respiratory failure with hypoxemia (HCC)   Status post tracheostomy (HCC)   Atrial fibrillation, chronic   Acute on chronic diastolic CHF (congestive heart failure) (HCC)   1. Acute on chronic respiratory failure with hypoxia we will continue with the capping trials continue secretion management pulmonary toilet. 2. Acute stroke unchanged continue with therapy as tolerated 3. Status post tracheostomy now capping hopefully should be able to decannulate 4. Chronic atrial fibrillation rate is controlled 5. Acute on chronic diastolic heart failure baseline   I have personally seen and evaluated the patient, evaluated laboratory and imaging results, formulated the assessment and plan and placed orders. The Patient requires high complexity decision making for assessment and support.  Case was discussed on Rounds with the Respiratory Therapy Staff  Yevonne Pax, MD [facility] Pulmonary Critical Care Medicine Sleep Medicine

## 2018-03-12 ENCOUNTER — Other Ambulatory Visit (HOSPITAL_COMMUNITY): Payer: Self-pay

## 2018-03-12 DIAGNOSIS — I5033 Acute on chronic diastolic (congestive) heart failure: Secondary | ICD-10-CM | POA: Diagnosis not present

## 2018-03-12 DIAGNOSIS — I482 Chronic atrial fibrillation, unspecified: Secondary | ICD-10-CM | POA: Diagnosis not present

## 2018-03-12 DIAGNOSIS — J9601 Acute respiratory failure with hypoxia: Secondary | ICD-10-CM | POA: Diagnosis not present

## 2018-03-12 DIAGNOSIS — I63512 Cerebral infarction due to unspecified occlusion or stenosis of left middle cerebral artery: Secondary | ICD-10-CM | POA: Diagnosis not present

## 2018-03-12 LAB — URINALYSIS, ROUTINE W REFLEX MICROSCOPIC
Bilirubin Urine: NEGATIVE
Glucose, UA: NEGATIVE mg/dL
Hgb urine dipstick: NEGATIVE
Ketones, ur: NEGATIVE mg/dL
Leukocytes,Ua: NEGATIVE
Nitrite: NEGATIVE
PH: 7 (ref 5.0–8.0)
Protein, ur: NEGATIVE mg/dL
Specific Gravity, Urine: 1.013 (ref 1.005–1.030)

## 2018-03-12 LAB — CBC
HCT: 47.1 % — ABNORMAL HIGH (ref 36.0–46.0)
Hemoglobin: 14.1 g/dL (ref 12.0–15.0)
MCH: 28.9 pg (ref 26.0–34.0)
MCHC: 29.9 g/dL — ABNORMAL LOW (ref 30.0–36.0)
MCV: 96.5 fL (ref 80.0–100.0)
Platelets: 377 10*3/uL (ref 150–400)
RBC: 4.88 MIL/uL (ref 3.87–5.11)
RDW: 18.9 % — ABNORMAL HIGH (ref 11.5–15.5)
WBC: 19.7 10*3/uL — ABNORMAL HIGH (ref 4.0–10.5)
nRBC: 0.1 % (ref 0.0–0.2)

## 2018-03-12 LAB — BASIC METABOLIC PANEL
Anion gap: 13 (ref 5–15)
BUN: 80 mg/dL — ABNORMAL HIGH (ref 8–23)
CALCIUM: 8.9 mg/dL (ref 8.9–10.3)
CO2: 32 mmol/L (ref 22–32)
Chloride: 110 mmol/L (ref 98–111)
Creatinine, Ser: 1.31 mg/dL — ABNORMAL HIGH (ref 0.44–1.00)
GFR, EST AFRICAN AMERICAN: 43 mL/min — AB (ref >60)
GFR, EST NON AFRICAN AMERICAN: 37 mL/min — AB (ref >60)
Glucose, Bld: 178 mg/dL — ABNORMAL HIGH (ref 70–99)
Potassium: 3.3 mmol/L — ABNORMAL LOW (ref 3.5–5.1)
Sodium: 155 mmol/L — ABNORMAL HIGH (ref 135–145)

## 2018-03-12 LAB — MAGNESIUM: Magnesium: 2.9 mg/dL — ABNORMAL HIGH (ref 1.7–2.4)

## 2018-03-12 NOTE — Progress Notes (Addendum)
Pulmonary Critical Care Medicine [facility] [place]   PULMONARY CRITICAL CARE SERVICE  PROGRESS NOTE  Date of Service: 03/12/2018  Kelli Vazquez  GDJ:[id number]  DOB: 1929/10/10   DOA: 02/19/2018  Referring Physician: Carron Curie, MD  HPI: Kelli Vazquez is a 85 y.o. female seen for follow up of Acute on Chronic Respiratory Failure.  Patient continues with capping and on room air.  Has a small amount of secretions.  Most likely will not be decannulated at this time, due to a weak cough.  Medications: Reviewed on Rounds  Physical Exam:  Vitals: Pulse 102 respiration 17 BP 115/70 O2 sat 96% temp 96.8  Ventilator Settings not currently on ventilator  . General: Comfortable at this time . Eyes: Grossly normal lids, irises & conjunctiva . ENT: grossly tongue is normal . Neck: no obvious mass . Cardiovascular: S1 S2 normal no gallop . Respiratory: No rales or rhonchi noted . Abdomen: soft . Skin: no rash seen on limited exam . Musculoskeletal: not rigid . Psychiatric:unable to assess . Neurologic: no seizure no involuntary movements         Lab Data:   Basic Metabolic Panel: Recent Labs  Lab 03/08/18 0506 03/12/18 0608  NA 143 155*  K 4.6 3.3*  CL 105 110  CO2 27 32  GLUCOSE 134* 178*  BUN 44* 80*  CREATININE 0.98 1.31*  CALCIUM 8.8* 8.9  MG 2.7* 2.9*  PHOS 3.7  --     ABG: No results for input(s): PHART, PCO2ART, PO2ART, HCO3, O2SAT in the last 168 hours.  Liver Function Tests: No results for input(s): AST, ALT, ALKPHOS, BILITOT, PROT, ALBUMIN in the last 168 hours. No results for input(s): LIPASE, AMYLASE in the last 168 hours. No results for input(s): AMMONIA in the last 168 hours.  CBC: Recent Labs  Lab 03/08/18 0506 03/12/18 0608  WBC 9.5 19.7*  HGB 13.4 14.1  HCT 43.7 47.1*  MCV 93.8 96.5  PLT 364 377    Cardiac Enzymes: No results for input(s): CKTOTAL, CKMB, CKMBINDEX, TROPONINI in the last 168 hours.  BNP (last 3 results) No  results for input(s): BNP in the last 8760 hours.  ProBNP (last 3 results) No results for input(s): PROBNP in the last 8760 hours.  Radiological Exams: Dg Chest Port 1 View  Result Date: 03/12/2018 CLINICAL DATA:  Pneumonia. EXAM: PORTABLE CHEST 1 VIEW COMPARISON:  02/19/2018 and 02/18/2018 and 02/15/2018 and 01/27/2018 FINDINGS: Tracheostomy tube appears in good position. Heart size and pulmonary vascularity are normal. The infiltrates and effusions have resolved since the prior exams. Lungs are now clear. Aortic atherosclerosis. No acute bone abnormality. IMPRESSION: Complete clearing of the pulmonary infiltrates and pleural effusions. No acute abnormalities. Aortic Atherosclerosis (ICD10-I70.0). Electronically Signed   By: Francene Boyers M.D.   On: 03/12/2018 12:22    Assessment/Plan Active Problems:   Acute ischemic left MCA stroke (HCC)   Acute respiratory failure with hypoxemia (HCC)   Status post tracheostomy (HCC)   Atrial fibrillation, chronic   Acute on chronic diastolic CHF (congestive heart failure) (HCC)   1. Acute on chronic respiratory failure with hypoxia continue with capping trials continue secretion management and pulmonary toilet. 2. Acute stroke unchanged continue therapy as tolerated 3. Status post tracheostomy capped at this time. 4. Chronic atrial fibrillation rate controlled 5. Acute on chronic diastolic heart failure baseline   I have personally seen and evaluated the patient, evaluated laboratory and imaging results, formulated the assessment and plan and placed orders. The Patient requires  high complexity decision making for assessment and support.  Case was discussed on Rounds with the Respiratory Therapy Staff  Kelli Gee, MD [facility] Pulmonary Critical Care Medicine Sleep Medicine

## 2018-03-13 ENCOUNTER — Other Ambulatory Visit (HOSPITAL_COMMUNITY): Payer: Self-pay

## 2018-03-13 DIAGNOSIS — I63512 Cerebral infarction due to unspecified occlusion or stenosis of left middle cerebral artery: Secondary | ICD-10-CM | POA: Diagnosis not present

## 2018-03-13 DIAGNOSIS — I5033 Acute on chronic diastolic (congestive) heart failure: Secondary | ICD-10-CM | POA: Diagnosis not present

## 2018-03-13 DIAGNOSIS — J9601 Acute respiratory failure with hypoxia: Secondary | ICD-10-CM | POA: Diagnosis not present

## 2018-03-13 DIAGNOSIS — I482 Chronic atrial fibrillation, unspecified: Secondary | ICD-10-CM | POA: Diagnosis not present

## 2018-03-13 LAB — CBC
HCT: 47.5 % — ABNORMAL HIGH (ref 36.0–46.0)
Hemoglobin: 14.4 g/dL (ref 12.0–15.0)
MCH: 29.6 pg (ref 26.0–34.0)
MCHC: 30.3 g/dL (ref 30.0–36.0)
MCV: 97.5 fL (ref 80.0–100.0)
Platelets: 309 10*3/uL (ref 150–400)
RBC: 4.87 MIL/uL (ref 3.87–5.11)
RDW: 19.4 % — ABNORMAL HIGH (ref 11.5–15.5)
WBC: 16.7 10*3/uL — ABNORMAL HIGH (ref 4.0–10.5)
nRBC: 0.1 % (ref 0.0–0.2)

## 2018-03-13 LAB — COMPREHENSIVE METABOLIC PANEL
ALT: 347 U/L — ABNORMAL HIGH (ref 0–44)
AST: 168 U/L — ABNORMAL HIGH (ref 15–41)
Albumin: 2.4 g/dL — ABNORMAL LOW (ref 3.5–5.0)
Alkaline Phosphatase: 269 U/L — ABNORMAL HIGH (ref 38–126)
Anion gap: 12 (ref 5–15)
BUN: 71 mg/dL — ABNORMAL HIGH (ref 8–23)
CO2: 34 mmol/L — AB (ref 22–32)
Calcium: 8.5 mg/dL — ABNORMAL LOW (ref 8.9–10.3)
Chloride: 108 mmol/L (ref 98–111)
Creatinine, Ser: 1.2 mg/dL — ABNORMAL HIGH (ref 0.44–1.00)
GFR calc non Af Amer: 41 mL/min — ABNORMAL LOW (ref >60)
GFR, EST AFRICAN AMERICAN: 47 mL/min — AB (ref >60)
Glucose, Bld: 134 mg/dL — ABNORMAL HIGH (ref 70–99)
Potassium: 4.1 mmol/L (ref 3.5–5.1)
SODIUM: 154 mmol/L — AB (ref 135–145)
Total Bilirubin: 0.8 mg/dL (ref 0.3–1.2)
Total Protein: 6.7 g/dL (ref 6.5–8.1)

## 2018-03-13 LAB — URINE CULTURE: Culture: NO GROWTH

## 2018-03-13 NOTE — Progress Notes (Addendum)
Pulmonary Critical Care Medicine [facility] [place]   PULMONARY CRITICAL CARE SERVICE  PROGRESS NOTE  Date of Service: 03/13/2018  [name]  JSH:[id number]  DOB: 1929/12/06   DOA: 02/19/2018  Referring Physician: Carron Curie, MD  HPI: Kelli Vazquez is a 85 y.o. female seen for follow up of Acute on Chronic Respiratory Failure.  Patient remains on room air and is capped at this time.  Decannulation is not possible at this time as patient has very weak cough and is unable to clear secretions from her airway.  Medications: Reviewed on Rounds  Physical Exam:  Vitals: Pulse 94 respirations 20 BP 103/62 O2 sat 98% temp 96.1  Ventilator Settings not currently on ventilator  . General: Comfortable at this time . Eyes: Grossly normal lids, irises & conjunctiva . ENT: grossly tongue is normal . Neck: no obvious mass . Cardiovascular: S1 S2 normal no gallop . Respiratory: No rales or rhonchi noted . Abdomen: soft . Skin: no rash seen on limited exam . Musculoskeletal: not rigid . Psychiatric:unable to assess . Neurologic: no seizure no involuntary movements         Lab Data:   Basic Metabolic Panel: Recent Labs  Lab 03/08/18 0506 03/12/18 0608 03/13/18 0532  NA 143 155* 154*  K 4.6 3.3* 4.1  CL 105 110 108  CO2 27 32 34*  GLUCOSE 134* 178* 134*  BUN 44* 80* 71*  CREATININE 0.98 1.31* 1.20*  CALCIUM 8.8* 8.9 8.5*  MG 2.7* 2.9*  --   PHOS 3.7  --   --     ABG: No results for input(s): PHART, PCO2ART, PO2ART, HCO3, O2SAT in the last 168 hours.  Liver Function Tests: Recent Labs  Lab 03/13/18 0532  AST 168*  ALT 347*  ALKPHOS 269*  BILITOT 0.8  PROT 6.7  ALBUMIN 2.4*   No results for input(s): LIPASE, AMYLASE in the last 168 hours. No results for input(s): AMMONIA in the last 168 hours.  CBC: Recent Labs  Lab 03/08/18 0506 03/12/18 0608 03/13/18 0532  WBC 9.5 19.7* 16.7*  HGB 13.4 14.1 14.4  HCT 43.7 47.1* 47.5*  MCV 93.8 96.5 97.5   PLT 364 377 309    Cardiac Enzymes: No results for input(s): CKTOTAL, CKMB, CKMBINDEX, TROPONINI in the last 168 hours.  BNP (last 3 results) No results for input(s): BNP in the last 8760 hours.  ProBNP (last 3 results) No results for input(s): PROBNP in the last 8760 hours.  Radiological Exams: Dg Chest Port 1 View  Result Date: 03/12/2018 CLINICAL DATA:  Pneumonia. EXAM: PORTABLE CHEST 1 VIEW COMPARISON:  02/19/2018 and 02/18/2018 and 02/15/2018 and 01/27/2018 FINDINGS: Tracheostomy tube appears in good position. Heart size and pulmonary vascularity are normal. The infiltrates and effusions have resolved since the prior exams. Lungs are now clear. Aortic atherosclerosis. No acute bone abnormality. IMPRESSION: Complete clearing of the pulmonary infiltrates and pleural effusions. No acute abnormalities. Aortic Atherosclerosis (ICD10-I70.0). Electronically Signed   By: Francene Boyers M.D.   On: 03/12/2018 12:22    Assessment/Plan Active Problems:   Acute ischemic left MCA stroke (HCC)   Acute respiratory failure with hypoxemia (HCC)   Status post tracheostomy (HCC)   Atrial fibrillation, chronic   Acute on chronic diastolic CHF (congestive heart failure) (HCC)   1. Acute on chronic respiratory failure with hypoxia continue with capping trials and continue secretion management pulmonary toilet 2. Acute stroke unchanged continue therapy as tolerated 3. Status post tracheostomy capped at this time 4. Chronic  atrial fibrillation rate controlled 5. Acute on chronic diastolic heart failure at baseline   I have personally seen and evaluated the patient, evaluated laboratory and imaging results, formulated the assessment and plan and placed orders. The Patient requires high complexity decision making for assessment and support.  Case was discussed on Rounds with the Respiratory Therapy Staff  Yevonne Pax, MD [facility] Pulmonary Critical Care Medicine Sleep Medicine

## 2018-03-14 DIAGNOSIS — I5033 Acute on chronic diastolic (congestive) heart failure: Secondary | ICD-10-CM | POA: Diagnosis not present

## 2018-03-14 DIAGNOSIS — I482 Chronic atrial fibrillation, unspecified: Secondary | ICD-10-CM | POA: Diagnosis not present

## 2018-03-14 DIAGNOSIS — J9601 Acute respiratory failure with hypoxia: Secondary | ICD-10-CM | POA: Diagnosis not present

## 2018-03-14 DIAGNOSIS — I63512 Cerebral infarction due to unspecified occlusion or stenosis of left middle cerebral artery: Secondary | ICD-10-CM | POA: Diagnosis not present

## 2018-03-14 LAB — CULTURE, RESPIRATORY W GRAM STAIN

## 2018-03-14 LAB — PROTIME-INR
INR: 1 (ref 0.8–1.2)
Prothrombin Time: 13.1 seconds (ref 11.4–15.2)

## 2018-03-14 LAB — BASIC METABOLIC PANEL
Anion gap: 10 (ref 5–15)
BUN: 55 mg/dL — ABNORMAL HIGH (ref 8–23)
CO2: 34 mmol/L — ABNORMAL HIGH (ref 22–32)
CREATININE: 1.03 mg/dL — AB (ref 0.44–1.00)
Calcium: 8.4 mg/dL — ABNORMAL LOW (ref 8.9–10.3)
Chloride: 103 mmol/L (ref 98–111)
GFR calc Af Amer: 57 mL/min — ABNORMAL LOW (ref >60)
GFR calc non Af Amer: 49 mL/min — ABNORMAL LOW (ref >60)
Glucose, Bld: 165 mg/dL — ABNORMAL HIGH (ref 70–99)
Potassium: 3.1 mmol/L — ABNORMAL LOW (ref 3.5–5.1)
Sodium: 147 mmol/L — ABNORMAL HIGH (ref 135–145)

## 2018-03-14 LAB — HEPATITIS PANEL, ACUTE
HCV Ab: <0.1 s/co ratio (ref 0.0–0.9)
HEP B C IGM: NEGATIVE
Hep A IgM: NEGATIVE
Hepatitis B Surface Ag: NEGATIVE

## 2018-03-14 LAB — CK: Total CK: 91 U/L (ref 38–234)

## 2018-03-14 NOTE — Progress Notes (Signed)
Pulmonary Critical Care Medicine [facility] [place]   PULMONARY CRITICAL CARE SERVICE  PROGRESS NOTE  Date of Service: 03/14/2018  Yakira Hannum  HFS:[id number]  DOB: [birth year]   DOA: 02/19/2018  Referring Physician: Carron Curie, MD  HPI: Mang Parlin is a 85 y.o. female seen for follow up of Acute on Chronic Respiratory Failure.  Patient is doing fairly well at this time comfortable right now without distress remains on T collar had to be uncapped overnight because of secretion issues  Medications: Reviewed on Rounds  Physical Exam:  Vitals: Temperature 97.3 pulse 94 respiratory 25 blood pressure 118/56 saturations 97%  Ventilator Settings patient is off the ventilator at this time  . General: Comfortable at this time . Eyes: Grossly normal lids, irises & conjunctiva . ENT: grossly tongue is normal . Neck: no obvious mass . Cardiovascular: S1 S2 normal no gallop . Respiratory: No rhonchi or rales are noted . Abdomen: soft . Skin: no rash seen on limited exam . Musculoskeletal: not rigid . Psychiatric:unable to assess . Neurologic: no seizure no involuntary movements         Lab Data:   Basic Metabolic Panel: Recent Labs  Lab 03/08/18 0506 03/12/18 0608 03/13/18 0532 03/14/18 0650  NA 143 155* 154* 147*  K 4.6 3.3* 4.1 3.1*  CL 105 110 108 103  CO2 27 32 34* 34*  GLUCOSE 134* 178* 134* 165*  BUN 44* 80* 71* 55*  CREATININE 0.98 1.31* 1.20* 1.03*  CALCIUM 8.8* 8.9 8.5* 8.4*  MG 2.7* 2.9*  --   --   PHOS 3.7  --   --   --     ABG: No results for input(s): PHART, PCO2ART, PO2ART, HCO3, O2SAT in the last 168 hours.  Liver Function Tests: Recent Labs  Lab 03/13/18 0532  AST 168*  ALT 347*  ALKPHOS 269*  BILITOT 0.8  PROT 6.7  ALBUMIN 2.4*   No results for input(s): LIPASE, AMYLASE in the last 168 hours. No results for input(s): AMMONIA in the last 168 hours.  CBC: Recent Labs  Lab 03/08/18 0506 03/12/18 0608 03/13/18 0532  WBC 9.5  19.7* 16.7*  HGB 13.4 14.1 14.4  HCT 43.7 47.1* 47.5*  MCV 93.8 96.5 97.5  PLT 364 377 309    Cardiac Enzymes: Recent Labs  Lab 03/14/18 0650  CKTOTAL 91    BNP (last 3 results) No results for input(s): BNP in the last 8760 hours.  ProBNP (last 3 results) No results for input(s): PROBNP in the last 8760 hours.  Radiological Exams: US Abdomen Limited Ruq  Result Date: 03/13/2018 CLINICAL DATA:  Elevated liver function studies. Previous cholecystectomy. EXAM: ULTRASOUND ABDOMEN LIMITED RIGHT UPPER QUADRANT COMPARISON:  None. FINDINGS: Gallbladder: Gallbladder is surgically absent. Common bile duct: Diameter: 8.7 mm. This is likely normal in a postoperative patient of this age. Liver: No focal lesion identified. Within normal limits in parenchymal echogenicity. Portal vein is patent on color Doppler imaging with normal direction of blood flow towards the liver. Incidental note of cyst in the upper pole right kidney measuring 4.3 cm diameter. Benign appearance. IMPRESSION: Surgical absence of the gallbladder. Bile ducts are normal for postoperative patient. No focal liver lesions. Electronically Signed   By: Burman Nieves M.D.   On: 03/13/2018 20:58    Assessment/Plan Active Problems:   Acute ischemic left MCA stroke (HCC)   Acute respiratory failure with hypoxemia (HCC)   Status post tracheostomy (HCC)   Atrial fibrillation, chronic   Acute on  chronic diastolic CHF (congestive heart failure) (HCC)   1. Acute on chronic respiratory failure with hypoxia we will continue with capping during the daytime and At night for secretion access 2. Acute stroke unchanged continue with supportive care 3. Status post tracheostomy at baseline 4. Chronic atrial fibrillation rate controlled 5. Acute on chronic diastolic heart failure at baseline   I have personally seen and evaluated the patient, evaluated laboratory and imaging results, formulated the assessment and plan and placed  orders. The Patient requires high complexity decision making for assessment and support.  Case was discussed on Rounds with the Respiratory Therapy Staff  Yevonne Pax, MD [facility] Pulmonary Critical Care Medicine Sleep Medicine

## 2018-03-15 DIAGNOSIS — I63512 Cerebral infarction due to unspecified occlusion or stenosis of left middle cerebral artery: Secondary | ICD-10-CM | POA: Diagnosis not present

## 2018-03-15 DIAGNOSIS — I5033 Acute on chronic diastolic (congestive) heart failure: Secondary | ICD-10-CM | POA: Diagnosis not present

## 2018-03-15 DIAGNOSIS — J9601 Acute respiratory failure with hypoxia: Secondary | ICD-10-CM | POA: Diagnosis not present

## 2018-03-15 DIAGNOSIS — I482 Chronic atrial fibrillation, unspecified: Secondary | ICD-10-CM | POA: Diagnosis not present

## 2018-03-15 LAB — T4, FREE: FREE T4: 1.31 ng/dL (ref 0.82–1.77)

## 2018-03-15 LAB — BASIC METABOLIC PANEL
ANION GAP: 11 (ref 5–15)
BUN: 46 mg/dL — ABNORMAL HIGH (ref 8–23)
CO2: 32 mmol/L (ref 22–32)
Calcium: 8.4 mg/dL — ABNORMAL LOW (ref 8.9–10.3)
Chloride: 96 mmol/L — ABNORMAL LOW (ref 98–111)
Creatinine, Ser: 0.92 mg/dL (ref 0.44–1.00)
GFR calc non Af Amer: 56 mL/min — ABNORMAL LOW (ref >60)
Glucose, Bld: 151 mg/dL — ABNORMAL HIGH (ref 70–99)
Potassium: 3.1 mmol/L — ABNORMAL LOW (ref 3.5–5.1)
Sodium: 139 mmol/L (ref 135–145)

## 2018-03-15 LAB — CBC
HCT: 38.8 % (ref 36.0–46.0)
HEMOGLOBIN: 11.8 g/dL — AB (ref 12.0–15.0)
MCH: 28.7 pg (ref 26.0–34.0)
MCHC: 30.4 g/dL (ref 30.0–36.0)
MCV: 94.4 fL (ref 80.0–100.0)
Platelets: 251 10*3/uL (ref 150–400)
RBC: 4.11 MIL/uL (ref 3.87–5.11)
RDW: 17.9 % — ABNORMAL HIGH (ref 11.5–15.5)
WBC: 12 10*3/uL — AB (ref 4.0–10.5)
nRBC: 0 % (ref 0.0–0.2)

## 2018-03-15 LAB — TSH: TSH: 10.486 u[IU]/mL — AB (ref 0.350–4.500)

## 2018-03-15 NOTE — Progress Notes (Signed)
Pulmonary Critical Care Medicine [facility] [place]   PULMONARY CRITICAL CARE SERVICE  PROGRESS NOTE  Date of Service: 03/15/2018  Kelli Vazquez  OMV:[id number]  DOB: 09-20-1929   DOA: 02/19/2018  Referring Physician: Carron Curie, MD  HPI: Kelli Vazquez is a 85 y.o. female seen for follow up of Acute on Chronic Respiratory Failure.  Patient remains on T collar has been doing well we should be able to proceed to capping during the daytime again  Medications: Reviewed on Rounds  Physical Exam:  Vitals: Temperature 97.3 pulse 73 respiratory 19 blood pressure 107/44 saturations 97%  Ventilator Settings patient is currently on T collar  . General: Comfortable at this time . Eyes: Grossly normal lids, irises & conjunctiva . ENT: grossly tongue is normal . Neck: no obvious mass . Cardiovascular: S1 S2 normal no gallop . Respiratory: No rhonchi or rales are noted . Abdomen: soft . Skin: no rash seen on limited exam . Musculoskeletal: not rigid . Psychiatric:unable to assess . Neurologic: no seizure no involuntary movements         Lab Data:   Basic Metabolic Panel: Recent Labs  Lab 03/12/18 0608 03/13/18 0532 03/14/18 0650 03/15/18 0623  NA 155* 154* 147* 139  K 3.3* 4.1 3.1* 3.1*  CL 110 108 103 96*  CO2 32 34* 34* 32  GLUCOSE 178* 134* 165* 151*  BUN 80* 71* 55* 46*  CREATININE 1.31* 1.20* 1.03* 0.92  CALCIUM 8.9 8.5* 8.4* 8.4*  MG 2.9*  --   --   --     ABG: No results for input(s): PHART, PCO2ART, PO2ART, HCO3, O2SAT in the last 168 hours.  Liver Function Tests: Recent Labs  Lab 03/13/18 0532  AST 168*  ALT 347*  ALKPHOS 269*  BILITOT 0.8  PROT 6.7  ALBUMIN 2.4*   No results for input(s): LIPASE, AMYLASE in the last 168 hours. No results for input(s): AMMONIA in the last 168 hours.  CBC: Recent Labs  Lab 03/12/18 0608 03/13/18 0532 03/15/18 0623  WBC 19.7* 16.7* 12.0*  HGB 14.1 14.4 11.8*  HCT 47.1* 47.5* 38.8  MCV 96.5 97.5 94.4   PLT 377 309 251    Cardiac Enzymes: Recent Labs  Lab 03/14/18 0650  CKTOTAL 91    BNP (last 3 results) No results for input(s): BNP in the last 8760 hours.  ProBNP (last 3 results) No results for input(s): PROBNP in the last 8760 hours.  Radiological Exams: US Abdomen Limited Ruq  Result Date: 03/13/2018 CLINICAL DATA:  Elevated liver function studies. Previous cholecystectomy. EXAM: ULTRASOUND ABDOMEN LIMITED RIGHT UPPER QUADRANT COMPARISON:  None. FINDINGS: Gallbladder: Gallbladder is surgically absent. Common bile duct: Diameter: 8.7 mm. This is likely normal in a postoperative patient of this age. Liver: No focal lesion identified. Within normal limits in parenchymal echogenicity. Portal vein is patent on color Doppler imaging with normal direction of blood flow towards the liver. Incidental note of cyst in the upper pole right kidney measuring 4.3 cm diameter. Benign appearance. IMPRESSION: Surgical absence of the gallbladder. Bile ducts are normal for postoperative patient. No focal liver lesions. Electronically Signed   By: Burman Nieves M.D.   On: 03/13/2018 20:58    Assessment/Plan Active Problems:   Acute ischemic left MCA stroke (HCC)   Acute respiratory failure with hypoxemia (HCC)   Status post tracheostomy (HCC)   Atrial fibrillation, chronic   Acute on chronic diastolic CHF (congestive heart failure) (HCC)   1. Acute on chronic respiratory failure with hypoxia  we will continue with T collar trials continue secretion management pulmonary toilet. 2. Acute stroke unchanged we will continue with supportive care. 3. Status post tracheostomy will continue with supportive care. 4. Chronic atrial fibrillation rate controlled 5. Acute on chronic diastolic heart failure at baseline compensated   I have personally seen and evaluated the patient, evaluated laboratory and imaging results, formulated the assessment and plan and placed orders. The Patient requires high  complexity decision making for assessment and support.  Case was discussed on Rounds with the Respiratory Therapy Staff  Yevonne Pax, MD [facility] Pulmonary Critical Care Medicine Sleep Medicine

## 2018-03-16 ENCOUNTER — Other Ambulatory Visit (HOSPITAL_COMMUNITY): Payer: Self-pay

## 2018-03-16 DIAGNOSIS — I63512 Cerebral infarction due to unspecified occlusion or stenosis of left middle cerebral artery: Secondary | ICD-10-CM | POA: Diagnosis not present

## 2018-03-16 DIAGNOSIS — I482 Chronic atrial fibrillation, unspecified: Secondary | ICD-10-CM | POA: Diagnosis not present

## 2018-03-16 DIAGNOSIS — I5033 Acute on chronic diastolic (congestive) heart failure: Secondary | ICD-10-CM | POA: Diagnosis not present

## 2018-03-16 DIAGNOSIS — J9601 Acute respiratory failure with hypoxia: Secondary | ICD-10-CM | POA: Diagnosis not present

## 2018-03-16 LAB — CBC
HCT: 34.5 % — ABNORMAL LOW (ref 36.0–46.0)
HEMOGLOBIN: 10.6 g/dL — AB (ref 12.0–15.0)
MCH: 28.9 pg (ref 26.0–34.0)
MCHC: 30.7 g/dL (ref 30.0–36.0)
MCV: 94 fL (ref 80.0–100.0)
Platelets: 233 10*3/uL (ref 150–400)
RBC: 3.67 MIL/uL — ABNORMAL LOW (ref 3.87–5.11)
RDW: 17.8 % — ABNORMAL HIGH (ref 11.5–15.5)
WBC: 11.3 10*3/uL — ABNORMAL HIGH (ref 4.0–10.5)
nRBC: 0 % (ref 0.0–0.2)

## 2018-03-16 LAB — CK TOTAL AND CKMB (NOT AT ARMC)
CK, MB: 12.6 ng/mL — ABNORMAL HIGH (ref 0.5–5.0)
CK, MB: 14.1 ng/mL — ABNORMAL HIGH (ref 0.5–5.0)
CK, MB: 13.8 ng/mL — ABNORMAL HIGH (ref 0.5–5.0)
Relative Index: INVALID (ref 0.0–2.5)
Relative Index: INVALID (ref 0.0–2.5)
Relative Index: INVALID (ref 0.0–2.5)
Total CK: 60 U/L (ref 38–234)
Total CK: 93 U/L (ref 38–234)
Total CK: 69 U/L (ref 38–234)

## 2018-03-16 LAB — POTASSIUM: POTASSIUM: 3.6 mmol/L (ref 3.5–5.1)

## 2018-03-16 LAB — TROPONIN I
TROPONIN I: 0.35 ng/mL — AB (ref <0.03)
Troponin I: 0.34 ng/mL (ref <0.03)

## 2018-03-16 LAB — ECHOCARDIOGRAM COMPLETE: Weight: 2603.19 oz

## 2018-03-16 LAB — LACTIC ACID, PLASMA: Lactic Acid, Venous: 1.6 mmol/L (ref 0.5–1.9)

## 2018-03-16 NOTE — Progress Notes (Addendum)
Pulmonary Critical Care Medicine [facility] [place]   PULMONARY CRITICAL CARE SERVICE  PROGRESS NOTE  Date of Service: 03/16/2018  Kelli Vazquez  ASU:[id number]  DOB: 05/17/1929   DOA: 02/19/2018  Referring Physician: Carron Curie, MD  HPI: Kelli Vazquez is a 85 y.o. female seen for follow up of Acute on Chronic Respiratory Failure.  Patient remains capped during the day on room air with no difficulty noted.  At night rest on ATC with 21% FiO2.  Medications: Reviewed on Rounds  Physical Exam:  Vitals: Pulse 76 respirations 27 BP 97/55 O2 sat 98% temp 96.5  Ventilator Settings room air during the day 21% T collar at night.  . General: Comfortable at this time . Eyes: Grossly normal lids, irises & conjunctiva . ENT: grossly tongue is normal . Neck: no obvious mass . Cardiovascular: S1 S2 normal no gallop . Respiratory: No rales or rhonchi noted . Abdomen: soft . Skin: no rash seen on limited exam . Musculoskeletal: not rigid . Psychiatric:unable to assess . Neurologic: no seizure no involuntary movements         Lab Data:   Basic Metabolic Panel: Recent Labs  Lab 03/12/18 0608 03/13/18 0532 03/14/18 0650 03/15/18 0623 03/16/18 0423  NA 155* 154* 147* 139  --   K 3.3* 4.1 3.1* 3.1* 3.6  CL 110 108 103 96*  --   CO2 32 34* 34* 32  --   GLUCOSE 178* 134* 165* 151*  --   BUN 80* 71* 55* 46*  --   CREATININE 1.31* 1.20* 1.03* 0.92  --   CALCIUM 8.9 8.5* 8.4* 8.4*  --   MG 2.9*  --   --   --   --     ABG: No results for input(s): PHART, PCO2ART, PO2ART, HCO3, O2SAT in the last 168 hours.  Liver Function Tests: Recent Labs  Lab 03/13/18 0532  AST 168*  ALT 347*  ALKPHOS 269*  BILITOT 0.8  PROT 6.7  ALBUMIN 2.4*   No results for input(s): LIPASE, AMYLASE in the last 168 hours. No results for input(s): AMMONIA in the last 168 hours.  CBC: Recent Labs  Lab 03/12/18 0608 03/13/18 0532 03/15/18 0623 03/16/18 0423  WBC 19.7* 16.7* 12.0* 11.3*   HGB 14.1 14.4 11.8* 10.6*  HCT 47.1* 47.5* 38.8 34.5*  MCV 96.5 97.5 94.4 94.0  PLT 377 309 251 233    Cardiac Enzymes: Recent Labs  Lab 03/14/18 0650 03/16/18 0055 03/16/18 0423 03/16/18 1216 03/16/18 1316  CKTOTAL 91 93 60 69  --   CKMB  --  14.1* 12.6* 13.8*  --   TROPONINI  --   --   --   --  0.35*    BNP (last 3 results) No results for input(s): BNP in the last 8760 hours.  ProBNP (last 3 results) No results for input(s): PROBNP in the last 8760 hours.  Radiological Exams: No results found.  Assessment/Plan Active Problems:   Acute ischemic left MCA stroke (HCC)   Acute respiratory failure with hypoxemia (HCC)   Status post tracheostomy (HCC)   Atrial fibrillation, chronic   Acute on chronic diastolic CHF (congestive heart failure) (HCC)   1. Acute on chronic respiratory failure with hypoxia continue with the T collar trials continue secretion management pulmonary toilet. 2. Acute stroke unchanged continue supportive care 3. Status post tracheostomy continue supportive care 4. Chronic atrial fibrillation rate controlled 5. Acute on chronic diastolic heart failure at baseline compensated   I have personally  seen and evaluated the patient, evaluated laboratory and imaging results, formulated the assessment and plan and placed orders. The Patient requires high complexity decision making for assessment and support.  Case was discussed on Rounds with the Respiratory Therapy Staff  Allyne Gee, MD [facility] Pulmonary Critical Care Medicine Sleep Medicine

## 2018-03-16 NOTE — Progress Notes (Signed)
  Echocardiogram 2D Echocardiogram has been performed.  Roosvelt Maser F 03/16/2018, 4:50 PM

## 2018-03-17 ENCOUNTER — Other Ambulatory Visit (HOSPITAL_COMMUNITY): Payer: Self-pay

## 2018-03-17 DIAGNOSIS — J9601 Acute respiratory failure with hypoxia: Secondary | ICD-10-CM | POA: Diagnosis not present

## 2018-03-17 DIAGNOSIS — I482 Chronic atrial fibrillation, unspecified: Secondary | ICD-10-CM | POA: Diagnosis not present

## 2018-03-17 DIAGNOSIS — I63512 Cerebral infarction due to unspecified occlusion or stenosis of left middle cerebral artery: Secondary | ICD-10-CM | POA: Diagnosis not present

## 2018-03-17 DIAGNOSIS — I5033 Acute on chronic diastolic (congestive) heart failure: Secondary | ICD-10-CM | POA: Diagnosis not present

## 2018-03-17 LAB — BASIC METABOLIC PANEL
Anion gap: 12 (ref 5–15)
BUN: 36 mg/dL — ABNORMAL HIGH (ref 8–23)
CO2: 31 mmol/L (ref 22–32)
Calcium: 9 mg/dL (ref 8.9–10.3)
Chloride: 96 mmol/L — ABNORMAL LOW (ref 98–111)
Creatinine, Ser: 0.84 mg/dL (ref 0.44–1.00)
GFR calc non Af Amer: >60 mL/min (ref >60)
Glucose, Bld: 140 mg/dL — ABNORMAL HIGH (ref 70–99)
Potassium: 3.2 mmol/L — ABNORMAL LOW (ref 3.5–5.1)
Sodium: 139 mmol/L (ref 135–145)

## 2018-03-17 LAB — TROPONIN I: Troponin I: 0.38 ng/mL (ref <0.03)

## 2018-03-17 NOTE — Progress Notes (Addendum)
Pulmonary Critical Care Medicine [facility] [place]   PULMONARY CRITICAL CARE SERVICE  PROGRESS NOTE  Date of Service: 03/17/2018  Kelli Vazquez  [id number]  DOB: 1929/08/10  Patient remains capped during the day on room air.  At night she rest on a T collar 21% FiO2. DOA: 02/19/2018  Referring Physician: Carron Curie, MD  HPI: Kelli Vazquez is a 85 y.o. female seen for follow up of Acute on Chronic Respiratory Failure.  Patient is good saturations and no acute stress at this time.  Medications: Reviewed on Rounds  Physical Exam:  Vitals: Pulse 96 respirations 30 BP 95/51 O2 sat 97% temp 96.2  Ventilator Settings patient's not currently on ventilator  . General: Comfortable at this time . Eyes: Grossly normal lids, irises & conjunctiva . ENT: grossly tongue is normal . Neck: no obvious mass . Cardiovascular: S1 S2 normal no gallop . Respiratory: Rales or rhonchi noted . Abdomen: soft . Skin: no rash seen on limited exam . Musculoskeletal: not rigid . Psychiatric:unable to assess . Neurologic: no seizure no involuntary movements         Lab Data:   Basic Metabolic Panel: Recent Labs  Lab 03/12/18 0608 03/13/18 0532 03/14/18 0650 03/15/18 0623 03/16/18 0423 03/17/18 0614  NA 155* 154* 147* 139  --  139  K 3.3* 4.1 3.1* 3.1* 3.6 3.2*  CL 110 108 103 96*  --  96*  CO2 32 34* 34* 32  --  31  GLUCOSE 178* 134* 165* 151*  --  140*  BUN 80* 71* 55* 46*  --  36*  CREATININE 1.31* 1.20* 1.03* 0.92  --  0.84  CALCIUM 8.9 8.5* 8.4* 8.4*  --  9.0  MG 2.9*  --   --   --   --   --     ABG: No results for input(s): PHART, PCO2ART, PO2ART, HCO3, O2SAT in the last 168 hours.  Liver Function Tests: Recent Labs  Lab 03/13/18 0532  AST 168*  ALT 347*  ALKPHOS 269*  BILITOT 0.8  PROT 6.7  ALBUMIN 2.4*   No results for input(s): LIPASE, AMYLASE in the last 168 hours. No results for input(s): AMMONIA in the last 168 hours.  CBC: Recent Labs  Lab  03/12/18 0608 03/13/18 0532 03/15/18 0623 03/16/18 0423  WBC 19.7* 16.7* 12.0* 11.3*  HGB 14.1 14.4 11.8* 10.6*  HCT 47.1* 47.5* 38.8 34.5*  MCV 96.5 97.5 94.4 94.0  PLT 377 309 251 233    Cardiac Enzymes: Recent Labs  Lab 03/14/18 0650 03/16/18 0055 03/16/18 0423 03/16/18 1216 03/16/18 1316 03/16/18 1841 03/17/18 0026  CKTOTAL 91 93 60 69  --   --   --   CKMB  --  14.1* 12.6* 13.8*  --   --   --   TROPONINI  --   --   --   --  0.35* 0.34* 0.38*    BNP (last 3 results) No results for input(s): BNP in the last 8760 hours.  ProBNP (last 3 results) No results for input(s): PROBNP in the last 8760 hours.  Radiological Exams: No results found.  Assessment/Plan Active Problems:   Acute ischemic left MCA stroke (HCC)   Acute respiratory failure with hypoxemia (HCC)   Status post tracheostomy (HCC)   Atrial fibrillation, chronic   Acute on chronic diastolic CHF (congestive heart failure) (HCC)   1. Acute on chronic respiratory with hypoxia continue with T collar trials as well as secretion management pulmonary toilet. 2. Acute  stroke unchanged continue supportive care 3. Status post tracheostomy continue supportive care 4. Chronic atrial fibrillation rate control 5. Acute on chronic diastolic heart failure 6. Compensated   I have personally seen and evaluated the patient, evaluated laboratory and imaging results, formulated the assessment and plan and placed orders. The Patient requires high complexity decision making for assessment and support.  Case was discussed on Rounds with the Respiratory Therapy Staff  Yevonne Pax, MD [facility] Pulmonary Critical Care Medicine Sleep Medicine

## 2018-03-18 DIAGNOSIS — J9601 Acute respiratory failure with hypoxia: Secondary | ICD-10-CM | POA: Diagnosis not present

## 2018-03-18 DIAGNOSIS — I63512 Cerebral infarction due to unspecified occlusion or stenosis of left middle cerebral artery: Secondary | ICD-10-CM | POA: Diagnosis not present

## 2018-03-18 DIAGNOSIS — I5033 Acute on chronic diastolic (congestive) heart failure: Secondary | ICD-10-CM | POA: Diagnosis not present

## 2018-03-18 DIAGNOSIS — I482 Chronic atrial fibrillation, unspecified: Secondary | ICD-10-CM | POA: Diagnosis not present

## 2018-03-18 LAB — COMPREHENSIVE METABOLIC PANEL
ALT: 102 U/L — AB (ref 0–44)
AST: 93 U/L — ABNORMAL HIGH (ref 15–41)
Albumin: 2.1 g/dL — ABNORMAL LOW (ref 3.5–5.0)
Alkaline Phosphatase: 148 U/L — ABNORMAL HIGH (ref 38–126)
Anion gap: 7 (ref 5–15)
BUN: 34 mg/dL — ABNORMAL HIGH (ref 8–23)
CO2: 29 mmol/L (ref 22–32)
CREATININE: 0.74 mg/dL (ref 0.44–1.00)
Calcium: 8.5 mg/dL — ABNORMAL LOW (ref 8.9–10.3)
Chloride: 101 mmol/L (ref 98–111)
GFR calc Af Amer: >60 mL/min (ref >60)
GFR calc non Af Amer: >60 mL/min (ref >60)
Glucose, Bld: 122 mg/dL — ABNORMAL HIGH (ref 70–99)
Potassium: 4.2 mmol/L (ref 3.5–5.1)
Sodium: 137 mmol/L (ref 135–145)
TOTAL PROTEIN: 5.6 g/dL — AB (ref 6.5–8.1)
Total Bilirubin: 1 mg/dL (ref 0.3–1.2)

## 2018-03-18 LAB — CBC
HCT: 32.8 % — ABNORMAL LOW (ref 36.0–46.0)
HEMOGLOBIN: 10.4 g/dL — AB (ref 12.0–15.0)
MCH: 29.9 pg (ref 26.0–34.0)
MCHC: 31.7 g/dL (ref 30.0–36.0)
MCV: 94.3 fL (ref 80.0–100.0)
Platelets: 229 10*3/uL (ref 150–400)
RBC: 3.48 MIL/uL — ABNORMAL LOW (ref 3.87–5.11)
RDW: 18.6 % — ABNORMAL HIGH (ref 11.5–15.5)
WBC: 11.5 10*3/uL — ABNORMAL HIGH (ref 4.0–10.5)
nRBC: 0 % (ref 0.0–0.2)

## 2018-03-18 LAB — URINALYSIS, ROUTINE W REFLEX MICROSCOPIC
Bilirubin Urine: NEGATIVE
Glucose, UA: NEGATIVE mg/dL
Hgb urine dipstick: NEGATIVE
KETONES UR: NEGATIVE mg/dL
Leukocytes,Ua: NEGATIVE
Nitrite: NEGATIVE
Protein, ur: NEGATIVE mg/dL
Specific Gravity, Urine: 1.015 (ref 1.005–1.030)
pH: 7 (ref 5.0–8.0)

## 2018-03-18 LAB — MAGNESIUM: Magnesium: 2.3 mg/dL (ref 1.7–2.4)

## 2018-03-18 NOTE — Progress Notes (Signed)
Pulmonary Critical Care Medicine [facility] [place]   PULMONARY CRITICAL CARE SERVICE  PROGRESS NOTE  Date of Service: 03/18/2018  Christee Wertzberger  ZDG:[id number]  DOB: 11-Aug-1929   DOA: 02/19/2018  Referring Physician: Carron Curie, MD  HPI: Kelli Vazquez is a 85 y.o. female seen for follow up of Acute on Chronic Respiratory Failure.  Patient is currently on T collar has been on room air capping was stopped over the weekend because of difficulty tolerating it  Medications: Reviewed on Rounds  Physical Exam:  Vitals: Temperature 96.5 pulse 84 respiratory 20 blood pressure 113/59 saturations 98%  Ventilator Settings off the ventilator on T collar currently  . General: Comfortable at this time . Eyes: Grossly normal lids, irises & conjunctiva . ENT: grossly tongue is normal . Neck: no obvious mass . Cardiovascular: S1 S2 normal no gallop . Respiratory: No rhonchi or rales are noted at this time . Abdomen: soft . Skin: no rash seen on limited exam . Musculoskeletal: not rigid . Psychiatric:unable to assess . Neurologic: no seizure no involuntary movements         Lab Data:   Basic Metabolic Panel: Recent Labs  Lab 03/12/18 0608 03/13/18 0532 03/14/18 0650 03/15/18 0623 03/16/18 0423 03/17/18 0614 03/18/18 0732  NA 155* 154* 147* 139  --  139 137  K 3.3* 4.1 3.1* 3.1* 3.6 3.2* 4.2  CL 110 108 103 96*  --  96* 101  CO2 32 34* 34* 32  --  31 29  GLUCOSE 178* 134* 165* 151*  --  140* 122*  BUN 80* 71* 55* 46*  --  36* 34*  CREATININE 1.31* 1.20* 1.03* 0.92  --  0.84 0.74  CALCIUM 8.9 8.5* 8.4* 8.4*  --  9.0 8.5*  MG 2.9*  --   --   --   --   --  2.3    ABG: No results for input(s): PHART, PCO2ART, PO2ART, HCO3, O2SAT in the last 168 hours.  Liver Function Tests: Recent Labs  Lab 03/13/18 0532 03/18/18 0732  AST 168* 93*  ALT 347* 102*  ALKPHOS 269* 148*  BILITOT 0.8 1.0  PROT 6.7 5.6*  ALBUMIN 2.4* 2.1*   No results for input(s): LIPASE,  AMYLASE in the last 168 hours. No results for input(s): AMMONIA in the last 168 hours.  CBC: Recent Labs  Lab 03/12/18 0608 03/13/18 0532 03/15/18 0623 03/16/18 0423 03/18/18 0732  WBC 19.7* 16.7* 12.0* 11.3* 11.5*  HGB 14.1 14.4 11.8* 10.6* 10.4*  HCT 47.1* 47.5* 38.8 34.5* 32.8*  MCV 96.5 97.5 94.4 94.0 94.3  PLT 377 309 251 233 229    Cardiac Enzymes: Recent Labs  Lab 03/14/18 0650 03/16/18 0055 03/16/18 0423 03/16/18 1216 03/16/18 1316 03/16/18 1841 03/17/18 0026  CKTOTAL 91 93 60 69  --   --   --   CKMB  --  14.1* 12.6* 13.8*  --   --   --   TROPONINI  --   --   --   --  0.35* 0.34* 0.38*    BNP (last 3 results) No results for input(s): BNP in the last 8760 hours.  ProBNP (last 3 results) No results for input(s): PROBNP in the last 8760 hours.  Radiological Exams: Dg Chest Port 1 View  Result Date: 03/17/2018 CLINICAL DATA:  Respiratory distress EXAM: PORTABLE CHEST 1 VIEW COMPARISON:  March 12, 2018 FINDINGS: Stable tracheostomy tube. Poor evaluation of the left retrocardiac region. I suspect atelectasis in this region. No pneumothorax.  No change in the cardiomediastinal silhouette. IMPRESSION: The study is limited due to patient rotation. There is probably atelectasis in the left retrocardiac region which is not well assessed. No other changes. Electronically Signed   By: Gerome Sam III M.D   On: 03/17/2018 17:49    Assessment/Plan Active Problems:   Acute ischemic left MCA stroke (HCC)   Acute respiratory failure with hypoxemia (HCC)   Status post tracheostomy (HCC)   Atrial fibrillation, chronic   Acute on chronic diastolic CHF (congestive heart failure) (HCC)   1. Acute on chronic respiratory failure with hypoxia we will continue with T collar at room air continue secretion management pulmonary toilet 2. Acute ischemic stroke unchanged supportive care 3. Tracheostomy remains in place 4. Chronic atrial fibrillation rate controlled 5. Acute on  chronic diastolic heart failure compensated right now   I have personally seen and evaluated the patient, evaluated laboratory and imaging results, formulated the assessment and plan and placed orders. The Patient requires high complexity decision making for assessment and support.  Case was discussed on Rounds with the Respiratory Therapy Staff  Yevonne Pax, MD [facility] Pulmonary Critical Care Medicine Sleep Medicine

## 2018-03-19 DIAGNOSIS — I482 Chronic atrial fibrillation, unspecified: Secondary | ICD-10-CM | POA: Diagnosis not present

## 2018-03-19 DIAGNOSIS — J9601 Acute respiratory failure with hypoxia: Secondary | ICD-10-CM | POA: Diagnosis not present

## 2018-03-19 DIAGNOSIS — I63512 Cerebral infarction due to unspecified occlusion or stenosis of left middle cerebral artery: Secondary | ICD-10-CM | POA: Diagnosis not present

## 2018-03-19 DIAGNOSIS — I5033 Acute on chronic diastolic (congestive) heart failure: Secondary | ICD-10-CM | POA: Diagnosis not present

## 2018-03-19 NOTE — Progress Notes (Signed)
Pulmonary Critical Care Medicine [facility] [place]   PULMONARY CRITICAL CARE SERVICE  PROGRESS NOTE  Date of Service: 03/19/2018  Kelli Vazquez  [id number]  DOB: [birth year]   DOA: 02/19/2018  Referring Physician: Carron Curie, MD  HPI: Kelli Vazquez is a 85 y.o. female seen for follow up of Acute on Chronic Respiratory Failure.  Patient is currently on T collar room air she is doing fairly well secretions are somewhat copious  Medications: Reviewed on Rounds  Physical Exam:  Vitals: Temperature 96.8 pulse 75 respiratory 25 blood pressure 101/60 saturations 99%  Ventilator Settings on T collar right now room air  . General: Comfortable at this time . Eyes: Grossly normal lids, irises & conjunctiva . ENT: grossly tongue is normal . Neck: no obvious mass . Cardiovascular: S1 S2 normal no gallop . Respiratory: No rhonchi or rales are noted at this time . Abdomen: soft . Skin: no rash seen on limited exam . Musculoskeletal: not rigid . Psychiatric:unable to assess . Neurologic: no seizure no involuntary movements         Lab Data:   Basic Metabolic Panel: Recent Labs  Lab 03/13/18 0532 03/14/18 0650 03/15/18 0623 03/16/18 0423 03/17/18 0614 03/18/18 0732  NA 154* 147* 139  --  139 137  K 4.1 3.1* 3.1* 3.6 3.2* 4.2  CL 108 103 96*  --  96* 101  CO2 34* 34* 32  --  31 29  GLUCOSE 134* 165* 151*  --  140* 122*  BUN 71* 55* 46*  --  36* 34*  CREATININE 1.20* 1.03* 0.92  --  0.84 0.74  CALCIUM 8.5* 8.4* 8.4*  --  9.0 8.5*  MG  --   --   --   --   --  2.3    ABG: No results for input(s): PHART, PCO2ART, PO2ART, HCO3, O2SAT in the last 168 hours.  Liver Function Tests: Recent Labs  Lab 03/13/18 0532 03/18/18 0732  AST 168* 93*  ALT 347* 102*  ALKPHOS 269* 148*  BILITOT 0.8 1.0  PROT 6.7 5.6*  ALBUMIN 2.4* 2.1*   No results for input(s): LIPASE, AMYLASE in the last 168 hours. No results for input(s): AMMONIA in the last 168  hours.  CBC: Recent Labs  Lab 03/13/18 0532 03/15/18 0623 03/16/18 0423 03/18/18 0732  WBC 16.7* 12.0* 11.3* 11.5*  HGB 14.4 11.8* 10.6* 10.4*  HCT 47.5* 38.8 34.5* 32.8*  MCV 97.5 94.4 94.0 94.3  PLT 309 251 233 229    Cardiac Enzymes: Recent Labs  Lab 03/14/18 0650 03/16/18 0055 03/16/18 0423 03/16/18 1216 03/16/18 1316 03/16/18 1841 03/17/18 0026  CKTOTAL 91 93 60 69  --   --   --   CKMB  --  14.1* 12.6* 13.8*  --   --   --   TROPONINI  --   --   --   --  0.35* 0.34* 0.38*    BNP (last 3 results) No results for input(s): BNP in the last 8760 hours.  ProBNP (last 3 results) No results for input(s): PROBNP in the last 8760 hours.  Radiological Exams: Dg Chest Port 1 View  Result Date: 03/17/2018 CLINICAL DATA:  Respiratory distress EXAM: PORTABLE CHEST 1 VIEW COMPARISON:  March 12, 2018 FINDINGS: Stable tracheostomy tube. Poor evaluation of the left retrocardiac region. I suspect atelectasis in this region. No pneumothorax. No change in the cardiomediastinal silhouette. IMPRESSION: The study is limited due to patient rotation. There is probably atelectasis in the left retrocardiac  region which is not well assessed. No other changes. Electronically Signed   By: Gerome Sam III M.D   On: 03/17/2018 17:49    Assessment/Plan Active Problems:   Acute ischemic left MCA stroke (HCC)   Acute respiratory failure with hypoxemia (HCC)   Status post tracheostomy (HCC)   Atrial fibrillation, chronic   Acute on chronic diastolic CHF (congestive heart failure) (HCC)   1. Acute on chronic respiratory failure with hypoxia we will continue with the T collar trials patient's not able to decannulate 2. Acute stroke grossly unchanged 3. Status post tracheostomy will remain in place 4. Chronic atrial fibrillation rate controlled 5. Acute diastolic heart failure compensated   I have personally seen and evaluated the patient, evaluated laboratory and imaging results,  formulated the assessment and plan and placed orders. The Patient requires high complexity decision making for assessment and support.  Case was discussed on Rounds with the Respiratory Therapy Staff  Yevonne Pax, MD [facility] Pulmonary Critical Care Medicine Sleep Medicine

## 2018-03-20 DIAGNOSIS — I63512 Cerebral infarction due to unspecified occlusion or stenosis of left middle cerebral artery: Secondary | ICD-10-CM | POA: Diagnosis not present

## 2018-03-20 DIAGNOSIS — J9601 Acute respiratory failure with hypoxia: Secondary | ICD-10-CM | POA: Diagnosis not present

## 2018-03-20 DIAGNOSIS — I5033 Acute on chronic diastolic (congestive) heart failure: Secondary | ICD-10-CM | POA: Diagnosis not present

## 2018-03-20 DIAGNOSIS — I482 Chronic atrial fibrillation, unspecified: Secondary | ICD-10-CM | POA: Diagnosis not present

## 2018-03-20 LAB — URINE CULTURE
Culture: >=100000 — AB
Special Requests: NORMAL

## 2018-03-20 NOTE — Progress Notes (Addendum)
Pulmonary Critical Care Medicine [facility] [place]   PULMONARY CRITICAL CARE SERVICE  PROGRESS NOTE  Date of Service: 03/20/2018  Kelli Vazquez  OQH:[id number]  DOB: 02/17/1929   DOA: 02/19/2018  Referring Physician: Carron Curie, MD  HPI: Kelli Vazquez is a 85 y.o. female seen for follow up of Acute on Chronic Respiratory Failure.  Patient remains on T collar with 21% FiO2.  Doing well at this time.  Does continue to have somewhat copious secretions.  Medications: Reviewed on Rounds  Physical Exam:  Vitals: Pulse 78 respirations 23 BP 105/50 O2 sat 98% temp 96.6  Ventilator Settings not currently on ventilator  . General: Comfortable at this time . Eyes: Grossly normal lids, irises & conjunctiva . ENT: grossly tongue is normal . Neck: no obvious mass . Cardiovascular: S1 S2 normal no gallop . Respiratory: No rales or rhonchi noted . Abdomen: soft . Skin: no rash seen on limited exam . Musculoskeletal: not rigid . Psychiatric:unable to assess . Neurologic: no seizure no involuntary movements         Lab Data:   Basic Metabolic Panel: Recent Labs  Lab 03/14/18 0650 03/15/18 0623 03/16/18 0423 03/17/18 0614 03/18/18 0732  NA 147* 139  --  139 137  K 3.1* 3.1* 3.6 3.2* 4.2  CL 103 96*  --  96* 101  CO2 34* 32  --  31 29  GLUCOSE 165* 151*  --  140* 122*  BUN 55* 46*  --  36* 34*  CREATININE 1.03* 0.92  --  0.84 0.74  CALCIUM 8.4* 8.4*  --  9.0 8.5*  MG  --   --   --   --  2.3    ABG: No results for input(s): PHART, PCO2ART, PO2ART, HCO3, O2SAT in the last 168 hours.  Liver Function Tests: Recent Labs  Lab 03/18/18 0732  AST 93*  ALT 102*  ALKPHOS 148*  BILITOT 1.0  PROT 5.6*  ALBUMIN 2.1*   No results for input(s): LIPASE, AMYLASE in the last 168 hours. No results for input(s): AMMONIA in the last 168 hours.  CBC: Recent Labs  Lab 03/15/18 0623 03/16/18 0423 03/18/18 0732  WBC 12.0* 11.3* 11.5*  HGB 11.8* 10.6* 10.4*  HCT 38.8  34.5* 32.8*  MCV 94.4 94.0 94.3  PLT 251 233 229    Cardiac Enzymes: Recent Labs  Lab 03/14/18 0650 03/16/18 0055 03/16/18 0423 03/16/18 1216 03/16/18 1316 03/16/18 1841 03/17/18 0026  CKTOTAL 91 93 60 69  --   --   --   CKMB  --  14.1* 12.6* 13.8*  --   --   --   TROPONINI  --   --   --   --  0.35* 0.34* 0.38*    BNP (last 3 results) No results for input(s): BNP in the last 8760 hours.  ProBNP (last 3 results) No results for input(s): PROBNP in the last 8760 hours.  Radiological Exams: No results found.  Assessment/Plan Active Problems:   Acute ischemic left MCA stroke (HCC)   Acute respiratory failure with hypoxemia (HCC)   Status post tracheostomy (HCC)   Atrial fibrillation, chronic   Acute on chronic diastolic CHF (congestive heart failure) (HCC)   1. Acute on chronic respiratory failure with hypoxia we will continue with T collar trials as patient is not able to decannulate at this time. 2. Acute stroke grossly unchanged 3. Status post tracheostomy remain in place 4. Chronic atrial fibrillation rate controlled 5. Acute diastolic heart failure compensated  I have personally seen and evaluated the patient, evaluated laboratory and imaging results, formulated the assessment and plan and placed orders. The Patient requires high complexity decision making for assessment and support.  Case was discussed on Rounds with the Respiratory Therapy Staff  Allyne Gee, MD [facility] Pulmonary Critical Care Medicine Sleep Medicine

## 2018-03-21 DIAGNOSIS — I5033 Acute on chronic diastolic (congestive) heart failure: Secondary | ICD-10-CM | POA: Diagnosis not present

## 2018-03-21 DIAGNOSIS — I482 Chronic atrial fibrillation, unspecified: Secondary | ICD-10-CM | POA: Diagnosis not present

## 2018-03-21 DIAGNOSIS — I63512 Cerebral infarction due to unspecified occlusion or stenosis of left middle cerebral artery: Secondary | ICD-10-CM | POA: Diagnosis not present

## 2018-03-21 DIAGNOSIS — J9601 Acute respiratory failure with hypoxia: Secondary | ICD-10-CM | POA: Diagnosis not present

## 2018-03-21 LAB — CBC
HCT: 30.7 % — ABNORMAL LOW (ref 36.0–46.0)
Hemoglobin: 9.9 g/dL — ABNORMAL LOW (ref 12.0–15.0)
MCH: 30.3 pg (ref 26.0–34.0)
MCHC: 32.2 g/dL (ref 30.0–36.0)
MCV: 93.9 fL (ref 80.0–100.0)
PLATELETS: 229 10*3/uL (ref 150–400)
RBC: 3.27 MIL/uL — AB (ref 3.87–5.11)
RDW: 18.7 % — ABNORMAL HIGH (ref 11.5–15.5)
WBC: 9.9 10*3/uL (ref 4.0–10.5)
nRBC: 0 % (ref 0.0–0.2)

## 2018-03-21 LAB — BASIC METABOLIC PANEL
Anion gap: 10 (ref 5–15)
BUN: 30 mg/dL — ABNORMAL HIGH (ref 8–23)
CALCIUM: 8.8 mg/dL — AB (ref 8.9–10.3)
CHLORIDE: 93 mmol/L — AB (ref 98–111)
CO2: 30 mmol/L (ref 22–32)
CREATININE: 0.81 mg/dL (ref 0.44–1.00)
GFR calc Af Amer: >60 mL/min (ref >60)
GFR calc non Af Amer: >60 mL/min (ref >60)
Glucose, Bld: 120 mg/dL — ABNORMAL HIGH (ref 70–99)
Potassium: 3.5 mmol/L (ref 3.5–5.1)
Sodium: 133 mmol/L — ABNORMAL LOW (ref 135–145)

## 2018-03-21 LAB — PHOSPHORUS: Phosphorus: 3.8 mg/dL (ref 2.5–4.6)

## 2018-03-21 LAB — MAGNESIUM: Magnesium: 2.1 mg/dL (ref 1.7–2.4)

## 2018-03-21 NOTE — Progress Notes (Signed)
Pulmonary Critical Care Medicine [facility] [place]   PULMONARY CRITICAL CARE SERVICE  PROGRESS NOTE  Date of Service: 03/21/2018  Kelli Vazquez  MWN:[id number]  DOB: April 11, 1929   DOA: 02/19/2018  Referring Physician: Carron Curie, MD  HPI: Kelli Vazquez is a 84 y.o. female seen for follow up of Acute on Chronic Respiratory Failure.  Patient remains on T collar currently on room air secretions are copious  Medications: Reviewed on Rounds  Physical Exam:  Vitals: Temperature 97.2 pulse 92 respiratory 25 blood pressure 100/64 saturations 96%  Ventilator Settings off the ventilator on T collar  . General: Comfortable at this time . Eyes: Grossly normal lids, irises & conjunctiva . ENT: grossly tongue is normal . Neck: no obvious mass . Cardiovascular: S1 S2 normal no gallop . Respiratory: No rhonchi or rales are noted at this time . Abdomen: soft . Skin: no rash seen on limited exam . Musculoskeletal: not rigid . Psychiatric:unable to assess . Neurologic: no seizure no involuntary movements         Lab Data:   Basic Metabolic Panel: Recent Labs  Lab 03/15/18 0623 03/16/18 0423 03/17/18 0614 03/18/18 0732 03/21/18 0601  NA 139  --  139 137 133*  K 3.1* 3.6 3.2* 4.2 3.5  CL 96*  --  96* 101 93*  CO2 32  --  31 29 30   GLUCOSE 151*  --  140* 122* 120*  BUN 46*  --  36* 34* 30*  CREATININE 0.92  --  0.84 0.74 0.81  CALCIUM 8.4*  --  9.0 8.5* 8.8*  MG  --   --   --  2.3 2.1  PHOS  --   --   --   --  3.8    ABG: No results for input(s): PHART, PCO2ART, PO2ART, HCO3, O2SAT in the last 168 hours.  Liver Function Tests: Recent Labs  Lab 03/18/18 0732  AST 93*  ALT 102*  ALKPHOS 148*  BILITOT 1.0  PROT 5.6*  ALBUMIN 2.1*   No results for input(s): LIPASE, AMYLASE in the last 168 hours. No results for input(s): AMMONIA in the last 168 hours.  CBC: Recent Labs  Lab 03/15/18 0623 03/16/18 0423 03/18/18 0732 03/21/18 0601  WBC 12.0* 11.3* 11.5*  9.9  HGB 11.8* 10.6* 10.4* 9.9*  HCT 38.8 34.5* 32.8* 30.7*  MCV 94.4 94.0 94.3 93.9  PLT 251 233 229 229    Cardiac Enzymes: Recent Labs  Lab 03/16/18 0055 03/16/18 0423 03/16/18 1216 03/16/18 1316 03/16/18 1841 03/17/18 0026  CKTOTAL 93 60 69  --   --   --   CKMB 14.1* 12.6* 13.8*  --   --   --   TROPONINI  --   --   --  0.35* 0.34* 0.38*    BNP (last 3 results) No results for input(s): BNP in the last 8760 hours.  ProBNP (last 3 results) No results for input(s): PROBNP in the last 8760 hours.  Radiological Exams: No results found.  Assessment/Plan Active Problems:   Acute ischemic left MCA stroke (HCC)   Acute respiratory failure with hypoxemia (HCC)   Status post tracheostomy (HCC)   Atrial fibrillation, chronic   Acute on chronic diastolic CHF (congestive heart failure) (HCC)   1. Acute on chronic respiratory failure hypoxia we will continue with T collar trials continue secretion management monitor. 2. Acute stroke unchanged grossly 3. Status post tracheostomy remains in place 4. Chronic atrial fibrillation rate controlled 5. Acute on chronic diastolic heart  failure compensated at this time   I have personally seen and evaluated the patient, evaluated laboratory and imaging results, formulated the assessment and plan and placed orders. The Patient requires high complexity decision making for assessment and support.  Case was discussed on Rounds with the Respiratory Therapy Staff  Yevonne Pax, MD [facility] Pulmonary Critical Care Medicine Sleep Medicine

## 2018-03-22 DIAGNOSIS — J9601 Acute respiratory failure with hypoxia: Secondary | ICD-10-CM | POA: Diagnosis not present

## 2018-03-22 DIAGNOSIS — I63512 Cerebral infarction due to unspecified occlusion or stenosis of left middle cerebral artery: Secondary | ICD-10-CM | POA: Diagnosis not present

## 2018-03-22 DIAGNOSIS — I5033 Acute on chronic diastolic (congestive) heart failure: Secondary | ICD-10-CM | POA: Diagnosis not present

## 2018-03-22 DIAGNOSIS — I482 Chronic atrial fibrillation, unspecified: Secondary | ICD-10-CM | POA: Diagnosis not present

## 2018-03-22 NOTE — Progress Notes (Signed)
Pulmonary Critical Care Medicine [facility] [place]   PULMONARY CRITICAL CARE SERVICE  PROGRESS NOTE  Date of Service: 03/22/2018  Kelli Vazquez  QQI:[id number]  DOB: October 05, 1929   DOA: 02/19/2018  Referring Physician: Carron Curie, MD  HPI: Kelli Vazquez is a 85 y.o. female seen for follow up of Acute on Chronic Respiratory Failure.  Patient remains on T collar has been on room air good saturations are noted at this time  Medications: Reviewed on Rounds  Physical Exam:  Vitals: Temperature 96.8 pulse 77 respiratory 24 blood pressure 157/63 saturations 99%  Ventilator Settings off the ventilator right now on T collar  . General: Comfortable at this time . Eyes: Grossly normal lids, irises & conjunctiva . ENT: grossly tongue is normal . Neck: no obvious mass . Cardiovascular: S1 S2 normal no gallop . Respiratory: Scattered rhonchi expansion is equal . Abdomen: soft . Skin: no rash seen on limited exam . Musculoskeletal: not rigid . Psychiatric:unable to assess . Neurologic: no seizure no involuntary movements         Lab Data:   Basic Metabolic Panel: Recent Labs  Lab 03/16/18 0423 03/17/18 0614 03/18/18 0732 03/21/18 0601  NA  --  139 137 133*  K 3.6 3.2* 4.2 3.5  CL  --  96* 101 93*  CO2  --  31 29 30   GLUCOSE  --  140* 122* 120*  BUN  --  36* 34* 30*  CREATININE  --  0.84 0.74 0.81  CALCIUM  --  9.0 8.5* 8.8*  MG  --   --  2.3 2.1  PHOS  --   --   --  3.8    ABG: No results for input(s): PHART, PCO2ART, PO2ART, HCO3, O2SAT in the last 168 hours.  Liver Function Tests: Recent Labs  Lab 03/18/18 0732  AST 93*  ALT 102*  ALKPHOS 148*  BILITOT 1.0  PROT 5.6*  ALBUMIN 2.1*   No results for input(s): LIPASE, AMYLASE in the last 168 hours. No results for input(s): AMMONIA in the last 168 hours.  CBC: Recent Labs  Lab 03/16/18 0423 03/18/18 0732 03/21/18 0601  WBC 11.3* 11.5* 9.9  HGB 10.6* 10.4* 9.9*  HCT 34.5* 32.8* 30.7*  MCV 94.0  94.3 93.9  PLT 233 229 229    Cardiac Enzymes: Recent Labs  Lab 03/16/18 0055 03/16/18 0423 03/16/18 1216 03/16/18 1316 03/16/18 1841 03/17/18 0026  CKTOTAL 93 60 69  --   --   --   CKMB 14.1* 12.6* 13.8*  --   --   --   TROPONINI  --   --   --  0.35* 0.34* 0.38*    BNP (last 3 results) No results for input(s): BNP in the last 8760 hours.  ProBNP (last 3 results) No results for input(s): PROBNP in the last 8760 hours.  Radiological Exams: No results found.  Assessment/Plan Active Problems:   Acute ischemic left MCA stroke (HCC)   Acute respiratory failure with hypoxemia (HCC)   Status post tracheostomy (HCC)   Atrial fibrillation, chronic   Acute on chronic diastolic CHF (congestive heart failure) (HCC)   1. Acute on chronic respiratory failure with hypoxia we will continue T collar trials patient has doing fine this will be her baseline 2. Acute stroke unchanged continue supportive care 3. Chronic atrial fibrillation rate controlled 4. Acute on chronic diastolic heart failure compensated 5. Status post tracheostomy which will remain in place as mentioned above   I have personally seen  and evaluated the patient, evaluated laboratory and imaging results, formulated the assessment and plan and placed orders. The Patient requires high complexity decision making for assessment and support.  Case was discussed on Rounds with the Respiratory Therapy Staff  Allyne Gee, MD [facility] Pulmonary Critical Care Medicine Sleep Medicine

## 2018-03-23 DIAGNOSIS — J9601 Acute respiratory failure with hypoxia: Secondary | ICD-10-CM | POA: Diagnosis not present

## 2018-03-23 DIAGNOSIS — I5033 Acute on chronic diastolic (congestive) heart failure: Secondary | ICD-10-CM | POA: Diagnosis not present

## 2018-03-23 DIAGNOSIS — I63512 Cerebral infarction due to unspecified occlusion or stenosis of left middle cerebral artery: Secondary | ICD-10-CM | POA: Diagnosis not present

## 2018-03-23 DIAGNOSIS — I482 Chronic atrial fibrillation, unspecified: Secondary | ICD-10-CM | POA: Diagnosis not present

## 2018-03-23 LAB — CBC
HCT: 32.2 % — ABNORMAL LOW (ref 36.0–46.0)
Hemoglobin: 10.1 g/dL — ABNORMAL LOW (ref 12.0–15.0)
MCH: 29.8 pg (ref 26.0–34.0)
MCHC: 31.4 g/dL (ref 30.0–36.0)
MCV: 95 fL (ref 80.0–100.0)
Platelets: 219 10*3/uL (ref 150–400)
RBC: 3.39 MIL/uL — ABNORMAL LOW (ref 3.87–5.11)
RDW: 19.1 % — ABNORMAL HIGH (ref 11.5–15.5)
WBC: 10.6 10*3/uL — ABNORMAL HIGH (ref 4.0–10.5)
nRBC: 0 % (ref 0.0–0.2)

## 2018-03-23 LAB — BASIC METABOLIC PANEL
Anion gap: 10 (ref 5–15)
BUN: 31 mg/dL — ABNORMAL HIGH (ref 8–23)
CO2: 28 mmol/L (ref 22–32)
CREATININE: 0.78 mg/dL (ref 0.44–1.00)
Calcium: 8.8 mg/dL — ABNORMAL LOW (ref 8.9–10.3)
Chloride: 95 mmol/L — ABNORMAL LOW (ref 98–111)
GFR calc Af Amer: >60 mL/min (ref >60)
GFR calc non Af Amer: >60 mL/min (ref >60)
Glucose, Bld: 122 mg/dL — ABNORMAL HIGH (ref 70–99)
Potassium: 3.7 mmol/L (ref 3.5–5.1)
SODIUM: 133 mmol/L — AB (ref 135–145)

## 2018-03-23 LAB — MAGNESIUM: Magnesium: 2 mg/dL (ref 1.7–2.4)

## 2018-03-23 LAB — PHOSPHORUS: Phosphorus: 3.7 mg/dL (ref 2.5–4.6)

## 2018-03-23 NOTE — Progress Notes (Signed)
Pulmonary Critical Care Medicine [facility] [place]   PULMONARY CRITICAL CARE SERVICE  PROGRESS NOTE  Date of Service: 03/23/2018  Alinda Plichta  ULA:[id number]  DOB: 1929/06/23   DOA: 02/19/2018  Referring Physician: Carron Curie, MD  HPI: Kelli Vazquez is a 85 y.o. female seen for follow up of Acute on Chronic Respiratory Failure.  Patient right now is on T collar has been on room air  Medications: Reviewed on Rounds  Physical Exam:  Vitals: Temperature 98.1 pulse 87 respiratory 23 blood pressure 103/56 saturations 99%  Ventilator Settings off the ventilator on T collar  . General: Comfortable at this time . Eyes: Grossly normal lids, irises & conjunctiva . ENT: grossly tongue is normal . Neck: no obvious mass . Cardiovascular: S1 S2 normal no gallop . Respiratory: No rhonchi coarse breath sounds . Abdomen: soft . Skin: no rash seen on limited exam . Musculoskeletal: not rigid . Psychiatric:unable to assess . Neurologic: no seizure no involuntary movements         Lab Data:   Basic Metabolic Panel: Recent Labs  Lab 03/17/18 0614 03/18/18 0732 03/21/18 0601 03/23/18 0402  NA 139 137 133* 133*  K 3.2* 4.2 3.5 3.7  CL 96* 101 93* 95*  CO2 31 29 30 28   GLUCOSE 140* 122* 120* 122*  BUN 36* 34* 30* 31*  CREATININE 0.84 0.74 0.81 0.78  CALCIUM 9.0 8.5* 8.8* 8.8*  MG  --  2.3 2.1 2.0  PHOS  --   --  3.8 3.7    ABG: No results for input(s): PHART, PCO2ART, PO2ART, HCO3, O2SAT in the last 168 hours.  Liver Function Tests: Recent Labs  Lab 03/18/18 0732  AST 93*  ALT 102*  ALKPHOS 148*  BILITOT 1.0  PROT 5.6*  ALBUMIN 2.1*   No results for input(s): LIPASE, AMYLASE in the last 168 hours. No results for input(s): AMMONIA in the last 168 hours.  CBC: Recent Labs  Lab 03/18/18 0732 03/21/18 0601 03/23/18 0402  WBC 11.5* 9.9 10.6*  HGB 10.4* 9.9* 10.1*  HCT 32.8* 30.7* 32.2*  MCV 94.3 93.9 95.0  PLT 229 229 219    Cardiac  Enzymes: Recent Labs  Lab 03/16/18 1841 03/17/18 0026  TROPONINI 0.34* 0.38*    BNP (last 3 results) No results for input(s): BNP in the last 8760 hours.  ProBNP (last 3 results) No results for input(s): PROBNP in the last 8760 hours.  Radiological Exams: No results found.  Assessment/Plan Active Problems:   Acute ischemic left MCA stroke (HCC)   Acute respiratory failure with hypoxemia (HCC)   Status post tracheostomy (HCC)   Atrial fibrillation, chronic   Acute on chronic diastolic CHF (congestive heart failure) (HCC)   1. Acute on chronic respiratory failure with hypoxia we will continue with the T collar she is at her baseline 2. Acute ischemic stroke unchanged supportive care 3. Tracheostomy remains in place 4. Chronic atrial fibrillation rate is controlled 5. Acute on chronic diastolic heart failure at baseline continue with supportive care   I have personally seen and evaluated the patient, evaluated laboratory and imaging results, formulated the assessment and plan and placed orders. The Patient requires high complexity decision making for assessment and support.  Case was discussed on Rounds with the Respiratory Therapy Staff  Yevonne Pax, MD [place] [place] Pulmonary Critical Care Medicine Sleep Medicine

## 2018-03-24 DIAGNOSIS — I482 Chronic atrial fibrillation, unspecified: Secondary | ICD-10-CM | POA: Diagnosis not present

## 2018-03-24 DIAGNOSIS — I63512 Cerebral infarction due to unspecified occlusion or stenosis of left middle cerebral artery: Secondary | ICD-10-CM | POA: Diagnosis not present

## 2018-03-24 DIAGNOSIS — J9601 Acute respiratory failure with hypoxia: Secondary | ICD-10-CM | POA: Diagnosis not present

## 2018-03-24 DIAGNOSIS — I5033 Acute on chronic diastolic (congestive) heart failure: Secondary | ICD-10-CM | POA: Diagnosis not present

## 2018-03-24 NOTE — Progress Notes (Signed)
Pulmonary Critical Care Medicine [facility] [place]   PULMONARY CRITICAL CARE SERVICE  PROGRESS NOTE  Date of Service: 03/24/2018  Kelli Vazquez  WLN:[id number]  DOB: 09/10/1929   DOA: 02/19/2018  Referring Physician: Carron Curie, MD  HPI: Kelli Vazquez is a 85 y.o. female seen for follow up of Acute on Chronic Respiratory Failure.  Patient currently is on T collar on room air she is doing well secretions are copious requiring frequent suctioning  Medications: Reviewed on Rounds  Physical Exam:  Vitals: Temperature 96.8 pulse 71 respiratory rate 26 blood pressure 108/52 saturations 97%  Ventilator Settings off the ventilator on T collar room air  . General: Comfortable at this time . Eyes: Grossly normal lids, irises & conjunctiva . ENT: grossly tongue is normal . Neck: no obvious mass . Cardiovascular: S1 S2 normal no gallop . Respiratory: Scattered rhonchi expansion is equal at this time . Abdomen: soft . Skin: no rash seen on limited exam . Musculoskeletal: not rigid . Psychiatric:unable to assess . Neurologic: no seizure no involuntary movements         Lab Data:   Basic Metabolic Panel: Recent Labs  Lab 03/18/18 0732 03/21/18 0601 03/23/18 0402  NA 137 133* 133*  K 4.2 3.5 3.7  CL 101 93* 95*  CO2 29 30 28   GLUCOSE 122* 120* 122*  BUN 34* 30* 31*  CREATININE 0.74 0.81 0.78  CALCIUM 8.5* 8.8* 8.8*  MG 2.3 2.1 2.0  PHOS  --  3.8 3.7    ABG: No results for input(s): PHART, PCO2ART, PO2ART, HCO3, O2SAT in the last 168 hours.  Liver Function Tests: Recent Labs  Lab 03/18/18 0732  AST 93*  ALT 102*  ALKPHOS 148*  BILITOT 1.0  PROT 5.6*  ALBUMIN 2.1*   No results for input(s): LIPASE, AMYLASE in the last 168 hours. No results for input(s): AMMONIA in the last 168 hours.  CBC: Recent Labs  Lab 03/18/18 0732 03/21/18 0601 03/23/18 0402  WBC 11.5* 9.9 10.6*  HGB 10.4* 9.9* 10.1*  HCT 32.8* 30.7* 32.2*  MCV 94.3 93.9 95.0  PLT 229  229 219    Cardiac Enzymes: No results for input(s): CKTOTAL, CKMB, CKMBINDEX, TROPONINI in the last 168 hours.  BNP (last 3 results) No results for input(s): BNP in the last 8760 hours.  ProBNP (last 3 results) No results for input(s): PROBNP in the last 8760 hours.  Radiological Exams: No results found.  Assessment/Plan Active Problems:   Acute ischemic left MCA stroke (HCC)   Acute respiratory failure with hypoxemia (HCC)   Status post tracheostomy (HCC)   Atrial fibrillation, chronic   Acute on chronic diastolic CHF (congestive heart failure) (HCC)   1. Acute on chronic respiratory failure with hypoxia we will continue with the T collar patient's at baseline 2. Acute stroke unchanged continue supportive care 3. Status post tracheostomy remains in place 4. Chronic atrial fibrillation rate controlled 5. Acute on chronic diastolic heart failure compensated   I have personally seen and evaluated the patient, evaluated laboratory and imaging results, formulated the assessment and plan and placed orders. The Patient requires high complexity decision making for assessment and support.  Case was discussed on Rounds with the Respiratory Therapy Staff  Yevonne Pax, MD [facility] Pulmonary Critical Care Medicine Sleep Medicine

## 2018-05-28 ENCOUNTER — Inpatient Hospital Stay (HOSPITAL_COMMUNITY)
Admission: EM | Admit: 2018-05-28 | Discharge: 2018-06-11 | DRG: 871 | Disposition: A | Discharge status: Skilled Nursing Facility | Payer: Medicare Other | Source: Other Acute Inpatient Hospital | Attending: Student | Admitting: Student

## 2018-05-28 DIAGNOSIS — I110 Hypertensive heart disease with heart failure: Secondary | ICD-10-CM | POA: Diagnosis present

## 2018-05-28 DIAGNOSIS — I482 Chronic atrial fibrillation, unspecified: Secondary | ICD-10-CM | POA: Diagnosis present

## 2018-05-28 DIAGNOSIS — J189 Pneumonia, unspecified organism: Secondary | ICD-10-CM | POA: Diagnosis present

## 2018-05-28 DIAGNOSIS — J9621 Acute and chronic respiratory failure with hypoxia: Secondary | ICD-10-CM | POA: Diagnosis present

## 2018-05-28 DIAGNOSIS — A419 Sepsis, unspecified organism: Secondary | ICD-10-CM | POA: Diagnosis present

## 2018-05-28 DIAGNOSIS — E871 Hypo-osmolality and hyponatremia: Secondary | ICD-10-CM | POA: Diagnosis not present

## 2018-05-28 DIAGNOSIS — E860 Dehydration: Secondary | ICD-10-CM | POA: Diagnosis present

## 2018-05-28 DIAGNOSIS — J9601 Acute respiratory failure with hypoxia: Secondary | ICD-10-CM | POA: Diagnosis not present

## 2018-05-28 DIAGNOSIS — I5032 Chronic diastolic (congestive) heart failure: Secondary | ICD-10-CM | POA: Diagnosis present

## 2018-05-28 DIAGNOSIS — R0600 Dyspnea, unspecified: Secondary | ICD-10-CM

## 2018-05-28 DIAGNOSIS — L89152 Pressure ulcer of sacral region, stage 2: Secondary | ICD-10-CM | POA: Diagnosis present

## 2018-05-28 DIAGNOSIS — Z930 Tracheostomy status: Secondary | ICD-10-CM

## 2018-05-28 DIAGNOSIS — L8992 Pressure ulcer of unspecified site, stage 2: Secondary | ICD-10-CM

## 2018-05-28 DIAGNOSIS — Z20828 Contact with and (suspected) exposure to other viral communicable diseases: Secondary | ICD-10-CM | POA: Diagnosis present

## 2018-05-28 DIAGNOSIS — Z7982 Long term (current) use of aspirin: Secondary | ICD-10-CM | POA: Diagnosis not present

## 2018-05-28 DIAGNOSIS — Z79899 Other long term (current) drug therapy: Secondary | ICD-10-CM | POA: Diagnosis not present

## 2018-05-28 DIAGNOSIS — Y95 Nosocomial condition: Secondary | ICD-10-CM | POA: Diagnosis present

## 2018-05-28 DIAGNOSIS — R609 Edema, unspecified: Secondary | ICD-10-CM | POA: Diagnosis not present

## 2018-05-28 DIAGNOSIS — Z6829 Body mass index (BMI) 29.0-29.9, adult: Secondary | ICD-10-CM

## 2018-05-28 DIAGNOSIS — R0602 Shortness of breath: Secondary | ICD-10-CM

## 2018-05-28 DIAGNOSIS — R195 Other fecal abnormalities: Secondary | ICD-10-CM | POA: Diagnosis present

## 2018-05-28 DIAGNOSIS — E876 Hypokalemia: Secondary | ICD-10-CM | POA: Diagnosis not present

## 2018-05-28 DIAGNOSIS — Z515 Encounter for palliative care: Secondary | ICD-10-CM | POA: Diagnosis present

## 2018-05-28 DIAGNOSIS — D638 Anemia in other chronic diseases classified elsewhere: Secondary | ICD-10-CM | POA: Diagnosis present

## 2018-05-28 DIAGNOSIS — Z8673 Personal history of transient ischemic attack (TIA), and cerebral infarction without residual deficits: Secondary | ICD-10-CM

## 2018-05-28 DIAGNOSIS — N19 Unspecified kidney failure: Secondary | ICD-10-CM

## 2018-05-28 DIAGNOSIS — G8191 Hemiplegia, unspecified affecting right dominant side: Secondary | ICD-10-CM | POA: Diagnosis present

## 2018-05-28 DIAGNOSIS — Z880 Allergy status to penicillin: Secondary | ICD-10-CM

## 2018-05-28 DIAGNOSIS — N179 Acute kidney failure, unspecified: Secondary | ICD-10-CM | POA: Diagnosis not present

## 2018-05-28 DIAGNOSIS — Z91040 Latex allergy status: Secondary | ICD-10-CM | POA: Diagnosis not present

## 2018-05-28 DIAGNOSIS — Z794 Long term (current) use of insulin: Secondary | ICD-10-CM

## 2018-05-28 DIAGNOSIS — R509 Fever, unspecified: Secondary | ICD-10-CM | POA: Diagnosis present

## 2018-05-28 DIAGNOSIS — Z931 Gastrostomy status: Secondary | ICD-10-CM

## 2018-05-28 DIAGNOSIS — R6510 Systemic inflammatory response syndrome (SIRS) of non-infectious origin without acute organ dysfunction: Secondary | ICD-10-CM | POA: Diagnosis not present

## 2018-05-28 DIAGNOSIS — Z7189 Other specified counseling: Secondary | ICD-10-CM | POA: Diagnosis not present

## 2018-05-28 DIAGNOSIS — F0390 Unspecified dementia without behavioral disturbance: Secondary | ICD-10-CM | POA: Diagnosis present

## 2018-05-28 DIAGNOSIS — R6889 Other general symptoms and signs: Secondary | ICD-10-CM

## 2018-05-28 DIAGNOSIS — R69 Illness, unspecified: Secondary | ICD-10-CM

## 2018-05-28 DIAGNOSIS — I4892 Unspecified atrial flutter: Secondary | ICD-10-CM | POA: Diagnosis present

## 2018-05-28 HISTORY — DX: Unspecified dementia, unspecified severity, without behavioral disturbance, psychotic disturbance, mood disturbance, and anxiety: F03.90

## 2018-05-28 HISTORY — DX: Cerebral infarction, unspecified: I63.9

## 2018-05-28 HISTORY — DX: Heart failure, unspecified: I50.9

## 2018-05-28 HISTORY — DX: Cardiac arrhythmia, unspecified: I49.9

## 2018-05-29 ENCOUNTER — Inpatient Hospital Stay (HOSPITAL_COMMUNITY): Payer: Medicare Other

## 2018-05-29 ENCOUNTER — Encounter (HOSPITAL_COMMUNITY): Payer: Self-pay | Admitting: Internal Medicine

## 2018-05-29 DIAGNOSIS — R609 Edema, unspecified: Secondary | ICD-10-CM

## 2018-05-29 DIAGNOSIS — J9601 Acute respiratory failure with hypoxia: Secondary | ICD-10-CM

## 2018-05-29 DIAGNOSIS — Z8673 Personal history of transient ischemic attack (TIA), and cerebral infarction without residual deficits: Secondary | ICD-10-CM

## 2018-05-29 DIAGNOSIS — L8992 Pressure ulcer of unspecified site, stage 2: Secondary | ICD-10-CM

## 2018-05-29 DIAGNOSIS — R6510 Systemic inflammatory response syndrome (SIRS) of non-infectious origin without acute organ dysfunction: Secondary | ICD-10-CM

## 2018-05-29 DIAGNOSIS — I482 Chronic atrial fibrillation, unspecified: Secondary | ICD-10-CM

## 2018-05-29 DIAGNOSIS — I5032 Chronic diastolic (congestive) heart failure: Secondary | ICD-10-CM | POA: Diagnosis present

## 2018-05-29 DIAGNOSIS — J189 Pneumonia, unspecified organism: Secondary | ICD-10-CM

## 2018-05-29 LAB — URINALYSIS, ROUTINE W REFLEX MICROSCOPIC
Bilirubin Urine: NEGATIVE
Glucose, UA: NEGATIVE mg/dL
Ketones, ur: NEGATIVE mg/dL
Leukocytes,Ua: NEGATIVE
Nitrite: NEGATIVE
Protein, ur: NEGATIVE mg/dL
Specific Gravity, Urine: 1.021 (ref 1.005–1.030)
pH: 7 (ref 5.0–8.0)

## 2018-05-29 LAB — PROCALCITONIN: Procalcitonin: 0.42 ng/mL

## 2018-05-29 LAB — LACTIC ACID, PLASMA
Lactic Acid, Venous: 1.6 mmol/L (ref 0.5–1.9)
Lactic Acid, Venous: 1.3 mmol/L (ref 0.5–1.9)

## 2018-05-29 LAB — CBC WITH DIFFERENTIAL/PLATELET
Abs Immature Granulocytes: 0.18 10*3/uL — ABNORMAL HIGH (ref 0.00–0.07)
Basophils Absolute: 0 10*3/uL (ref 0.0–0.1)
Basophils Relative: 0 %
Eosinophils Absolute: 0 10*3/uL (ref 0.0–0.5)
Eosinophils Relative: 0 %
HCT: 31.7 % — ABNORMAL LOW (ref 36.0–46.0)
Hemoglobin: 10.5 g/dL — ABNORMAL LOW (ref 12.0–15.0)
Immature Granulocytes: 1 %
Lymphocytes Relative: 6 %
Lymphs Abs: 1.3 10*3/uL (ref 0.7–4.0)
MCH: 29.2 pg (ref 26.0–34.0)
MCHC: 33.1 g/dL (ref 30.0–36.0)
MCV: 88.3 fL (ref 80.0–100.0)
Monocytes Absolute: 2.2 10*3/uL — ABNORMAL HIGH (ref 0.1–1.0)
Monocytes Relative: 9 %
Neutro Abs: 20.1 10*3/uL — ABNORMAL HIGH (ref 1.7–7.7)
Neutrophils Relative %: 84 %
Platelets: 363 10*3/uL (ref 150–400)
RBC: 3.59 MIL/uL — ABNORMAL LOW (ref 3.87–5.11)
RDW: 17.1 % — ABNORMAL HIGH (ref 11.5–15.5)
WBC: 23.8 10*3/uL — ABNORMAL HIGH (ref 4.0–10.5)
nRBC: 0 % (ref 0.0–0.2)

## 2018-05-29 LAB — COMPREHENSIVE METABOLIC PANEL
ALT: 62 U/L — ABNORMAL HIGH (ref 0–44)
AST: 72 U/L — ABNORMAL HIGH (ref 15–41)
Albumin: 2.4 g/dL — ABNORMAL LOW (ref 3.5–5.0)
Alkaline Phosphatase: 138 U/L — ABNORMAL HIGH (ref 38–126)
Anion gap: 18 — ABNORMAL HIGH (ref 5–15)
BUN: 61 mg/dL — ABNORMAL HIGH (ref 8–23)
CO2: 33 mmol/L — ABNORMAL HIGH (ref 22–32)
Calcium: 9 mg/dL (ref 8.9–10.3)
Chloride: 80 mmol/L — ABNORMAL LOW (ref 98–111)
Creatinine, Ser: 0.98 mg/dL (ref 0.44–1.00)
GFR calc Af Amer: >60 mL/min (ref >60)
GFR calc non Af Amer: 52 mL/min — ABNORMAL LOW (ref >60)
Glucose, Bld: 98 mg/dL (ref 70–99)
Potassium: 3.3 mmol/L — ABNORMAL LOW (ref 3.5–5.1)
Sodium: 131 mmol/L — ABNORMAL LOW (ref 135–145)
Total Bilirubin: 0.8 mg/dL (ref 0.3–1.2)
Total Protein: 7.5 g/dL (ref 6.5–8.1)

## 2018-05-29 LAB — GLUCOSE, CAPILLARY
Glucose-Capillary: 115 mg/dL — ABNORMAL HIGH (ref 70–99)
Glucose-Capillary: 116 mg/dL — ABNORMAL HIGH (ref 70–99)
Glucose-Capillary: 134 mg/dL — ABNORMAL HIGH (ref 70–99)
Glucose-Capillary: 157 mg/dL — ABNORMAL HIGH (ref 70–99)
Glucose-Capillary: 169 mg/dL — ABNORMAL HIGH (ref 70–99)
Glucose-Capillary: 170 mg/dL — ABNORMAL HIGH (ref 70–99)
Glucose-Capillary: 192 mg/dL — ABNORMAL HIGH (ref 70–99)

## 2018-05-29 LAB — TYPE AND SCREEN
ABO/RH(D): O POS
Antibody Screen: NEGATIVE

## 2018-05-29 LAB — SARS CORONAVIRUS 2 BY RT PCR (HOSPITAL ORDER, PERFORMED IN ~~LOC~~ HOSPITAL LAB): SARS Coronavirus 2: NEGATIVE

## 2018-05-29 LAB — MRSA PCR SCREENING: MRSA by PCR: NEGATIVE

## 2018-05-29 LAB — TROPONIN I
Troponin I: 0.36 ng/mL (ref <0.03)
Troponin I: 0.39 ng/mL (ref <0.03)
Troponin I: 0.33 ng/mL (ref <0.03)
Troponin I: 0.28 ng/mL (ref <0.03)

## 2018-05-29 LAB — ABO/RH: ABO/RH(D): O POS

## 2018-05-29 LAB — BRAIN NATRIURETIC PEPTIDE: B Natriuretic Peptide: 423.8 pg/mL — ABNORMAL HIGH (ref 0.0–100.0)

## 2018-05-29 MED ORDER — ACETAMINOPHEN 325 MG PO TABS
650.0000 mg | ORAL_TABLET | Freq: Four times a day (QID) | ORAL | Status: DC | PRN
  Administered 2018-05-29 – 2018-06-06 (×3): 650 mg via ORAL
  Filled 2018-05-29 (×3): qty 2

## 2018-05-29 MED ORDER — INSULIN ASPART 100 UNIT/ML ~~LOC~~ SOLN
0.0000 [IU] | SUBCUTANEOUS | Status: DC
  Administered 2018-05-29: 2 [IU] via SUBCUTANEOUS
  Administered 2018-05-29: 1 [IU] via SUBCUTANEOUS
  Administered 2018-05-29 – 2018-05-30 (×4): 2 [IU] via SUBCUTANEOUS
  Administered 2018-05-30: 23:00:00 3 [IU] via SUBCUTANEOUS
  Administered 2018-05-30 – 2018-05-31 (×6): 2 [IU] via SUBCUTANEOUS
  Administered 2018-05-31: 1 [IU] via SUBCUTANEOUS
  Administered 2018-06-01 (×4): 2 [IU] via SUBCUTANEOUS
  Administered 2018-06-01: 1 [IU] via SUBCUTANEOUS
  Administered 2018-06-02: 3 [IU] via SUBCUTANEOUS
  Administered 2018-06-02: 2 [IU] via SUBCUTANEOUS
  Administered 2018-06-02 (×3): 3 [IU] via SUBCUTANEOUS
  Administered 2018-06-02: 2 [IU] via SUBCUTANEOUS
  Administered 2018-06-03 (×3): 3 [IU] via SUBCUTANEOUS
  Administered 2018-06-03: 05:00:00 5 [IU] via SUBCUTANEOUS
  Administered 2018-06-03 – 2018-06-04 (×3): 3 [IU] via SUBCUTANEOUS
  Administered 2018-06-04: 2 [IU] via SUBCUTANEOUS
  Administered 2018-06-04: 5 [IU] via SUBCUTANEOUS
  Administered 2018-06-04 (×2): 3 [IU] via SUBCUTANEOUS
  Administered 2018-06-04: 05:00:00 5 [IU] via SUBCUTANEOUS
  Administered 2018-06-05: 2 [IU] via SUBCUTANEOUS
  Administered 2018-06-05: 1 [IU] via SUBCUTANEOUS
  Administered 2018-06-05 (×4): 2 [IU] via SUBCUTANEOUS
  Administered 2018-06-06 (×2): 1 [IU] via SUBCUTANEOUS
  Administered 2018-06-06 (×2): 2 [IU] via SUBCUTANEOUS
  Administered 2018-06-06: 3 [IU] via SUBCUTANEOUS
  Administered 2018-06-07 – 2018-06-08 (×7): 2 [IU] via SUBCUTANEOUS
  Administered 2018-06-08: 13:00:00 1 [IU] via SUBCUTANEOUS
  Administered 2018-06-08 – 2018-06-09 (×8): 2 [IU] via SUBCUTANEOUS
  Administered 2018-06-10: 1 [IU] via SUBCUTANEOUS
  Administered 2018-06-10: 2 [IU] via SUBCUTANEOUS
  Administered 2018-06-10 (×3): 1 [IU] via SUBCUTANEOUS
  Administered 2018-06-11 (×2): 2 [IU] via SUBCUTANEOUS
  Administered 2018-06-11 (×2): 1 [IU] via SUBCUTANEOUS
  Administered 2018-06-11: 2 [IU] via SUBCUTANEOUS

## 2018-05-29 MED ORDER — POTASSIUM CHLORIDE CRYS ER 20 MEQ PO TBCR: 40.0000 meq | EXTENDED_RELEASE_TABLET | Freq: Once | ORAL | Status: DC

## 2018-05-29 MED ORDER — ATORVASTATIN CALCIUM 10 MG PO TABS: 20.0000 mg | ORAL_TABLET | Freq: Every day | ORAL | Status: DC

## 2018-05-29 MED ORDER — VANCOMYCIN HCL IN DEXTROSE 750-5 MG/150ML-% IV SOLN
750.0000 mg | INTRAVENOUS | Status: DC
  Administered 2018-05-29: 750 mg via INTRAVENOUS
  Filled 2018-05-29 (×3): qty 150

## 2018-05-29 MED ORDER — ONDANSETRON HCL 4 MG/2ML IJ SOLN: 4.0000 mg | Freq: Four times a day (QID) | INTRAMUSCULAR | Status: DC | PRN

## 2018-05-29 MED ORDER — ONDANSETRON HCL 4 MG PO TABS: 4.0000 mg | ORAL_TABLET | Freq: Four times a day (QID) | ORAL | Status: DC | PRN

## 2018-05-29 MED ORDER — SODIUM CHLORIDE 0.9 % IV SOLN
INTRAVENOUS | Status: AC
  Administered 2018-05-29: 12:00:00 via INTRAVENOUS

## 2018-05-29 MED ORDER — FREE WATER: 400.0000 mL | Freq: Two times a day (BID) | Status: DC

## 2018-05-29 MED ORDER — TORSEMIDE 20 MG PO TABS
20.0000 mg | ORAL_TABLET | Freq: Two times a day (BID) | ORAL | Status: DC
  Administered 2018-05-29: 20 mg
  Filled 2018-05-29: qty 1

## 2018-05-29 MED ORDER — OSMOLITE 1.5 CAL PO LIQD
1000.0000 mL | ORAL | Status: DC
  Administered 2018-05-29 – 2018-05-30 (×2): 1000 mL
  Filled 2018-05-29 (×3): qty 1000

## 2018-05-29 MED ORDER — PRO-STAT SUGAR FREE PO LIQD
30.0000 mL | Freq: Two times a day (BID) | ORAL | Status: DC
  Administered 2018-05-29 – 2018-06-04 (×13): 30 mL
  Filled 2018-05-29 (×13): qty 30

## 2018-05-29 MED ORDER — POTASSIUM CHLORIDE 20 MEQ/15ML (10%) PO SOLN
40.0000 meq | Freq: Once | ORAL | Status: AC
  Administered 2018-05-29: 40 meq
  Filled 2018-05-29: qty 30

## 2018-05-29 MED ORDER — POLYETHYLENE GLYCOL 3350 17 G PO PACK
17.0000 g | PACK | Freq: Two times a day (BID) | ORAL | Status: DC
  Administered 2018-05-29 – 2018-06-10 (×15): 17 g
  Filled 2018-05-29 (×16): qty 1

## 2018-05-29 MED ORDER — PANTOPRAZOLE SODIUM 40 MG PO PACK
40.0000 mg | PACK | Freq: Every day | ORAL | Status: DC
  Administered 2018-05-29 – 2018-06-03 (×6): 40 mg
  Filled 2018-05-29 (×11): qty 20

## 2018-05-29 MED ORDER — CHLORHEXIDINE GLUCONATE 0.12 % MT SOLN
15.0000 mL | Freq: Two times a day (BID) | OROMUCOSAL | Status: DC
  Administered 2018-05-29 – 2018-06-11 (×26): 15 mL via OROMUCOSAL
  Filled 2018-05-29 (×22): qty 15

## 2018-05-29 MED ORDER — ORAL CARE MOUTH RINSE
15.0000 mL | Freq: Two times a day (BID) | OROMUCOSAL | Status: DC
  Administered 2018-05-30 – 2018-06-11 (×17): 15 mL via OROMUCOSAL

## 2018-05-29 MED ORDER — ENOXAPARIN SODIUM 40 MG/0.4ML ~~LOC~~ SOLN
40.0000 mg | Freq: Every day | SUBCUTANEOUS | Status: DC
  Administered 2018-05-29 – 2018-06-03 (×6): 40 mg via SUBCUTANEOUS
  Filled 2018-05-29 (×6): qty 0.4

## 2018-05-29 MED ORDER — BISACODYL 10 MG RE SUPP
10.0000 mg | Freq: Every day | RECTAL | Status: DC | PRN
  Filled 2018-05-29: qty 1

## 2018-05-29 MED ORDER — TORSEMIDE 20 MG PO TABS: 20.0000 mg | ORAL_TABLET | Freq: Two times a day (BID) | ORAL | Status: DC

## 2018-05-29 MED ORDER — TORSEMIDE 20 MG PO TABS: 40.0000 mg | ORAL_TABLET | Freq: Every day | ORAL | Status: DC

## 2018-05-29 MED ORDER — ASPIRIN 81 MG PO CHEW
81.0000 mg | CHEWABLE_TABLET | Freq: Every day | ORAL | Status: DC
  Administered 2018-05-29 – 2018-06-11 (×14): 81 mg
  Filled 2018-05-29 (×14): qty 1

## 2018-05-29 MED ORDER — DOCUSATE SODIUM 50 MG/5ML PO LIQD
100.0000 mg | Freq: Two times a day (BID) | ORAL | Status: DC
  Administered 2018-05-29 – 2018-06-10 (×14): 100 mg
  Filled 2018-05-29 (×13): qty 10

## 2018-05-29 MED ORDER — ACETAMINOPHEN 650 MG RE SUPP: 650.0000 mg | Freq: Four times a day (QID) | RECTAL | Status: DC | PRN

## 2018-05-29 MED ORDER — SODIUM CHLORIDE 0.9 % IV SOLN
2.0000 g | Freq: Two times a day (BID) | INTRAVENOUS | Status: DC
  Administered 2018-05-29 – 2018-06-02 (×10): 2 g via INTRAVENOUS
  Filled 2018-05-29 (×13): qty 2

## 2018-05-29 MED ORDER — CARVEDILOL 3.125 MG PO TABS
3.1250 mg | ORAL_TABLET | Freq: Two times a day (BID) | ORAL | Status: DC
  Administered 2018-05-29 – 2018-06-11 (×27): 3.125 mg
  Filled 2018-05-29 (×27): qty 1

## 2018-05-29 MED ORDER — DILTIAZEM HCL 60 MG PO TABS
30.0000 mg | ORAL_TABLET | Freq: Four times a day (QID) | ORAL | Status: DC
  Administered 2018-05-29 – 2018-06-11 (×53): 30 mg
  Filled 2018-05-29 (×52): qty 1

## 2018-05-29 NOTE — Progress Notes (Addendum)
Pt arrived to [facility] from [facility] hospital ED. Pt has tracheostomy and a feeding tube in the upper abdomen. Respiratory called to assess patient airway needs. Pt has a swollen right arm most likely from the IV placed there. Arm is red, swollen, hard, but cool to touch. Pulses present in brachial and radial area. Pt non-verbal and speaks Spanish as primary language. Pt was placed on telemetry and assessment completed. MD paged to let them know patient has arrived on the unit. Will continue to monitor. Victorino December, RN

## 2018-05-29 NOTE — TOC Initial Note (Signed)
Transition of Care [facility]) - Initial/Assessment Note    Patient Details  Name: Kelli Vazquez MRN: [id number] Date of Birth: 1929/04/18  Transition of Care [facility]) CM/SW Contact:    Margarito Liner, LCSW Phone Number: 05/29/2018, 1:30 PM  Clinical Narrative: Patient nonverbal. Jeanene Erb daughter, introduced role, and explained that discharge planning would be discussed. Daughter Marylu Lund put her sister on the phone because she understands English better. Other daughter confirmed she is from [facility] and has been there the past two months. She has been hoping to bring patient home but SNF does not think that is the safe at this time. CSW reinforced this and daughter agreeable. No further concerns. CSW encouraged patient's daughters to contact CSW as needed. CSW will continue to follow patient and her daughters for support and facilitate discharge back to SNF once medically stable.                 Expected Discharge Plan: Skilled Nursing Facility Barriers to Discharge: Continued Medical Work up   Patient Goals and CMS Choice Patient states their goals for this hospitalization and ongoing recovery are:: Patient nonverbal      Expected Discharge Plan and Services Expected Discharge Plan: Skilled Nursing Facility     Post Acute Care Choice: Skilled Nursing Facility Living arrangements for the past 2 months: Skilled Nursing Facility                                      Prior Living Arrangements/Services Living arrangements for the past 2 months: Skilled Nursing Facility Lives with:: Facility Resident Patient language and need for interpreter reviewed:: No Do you feel safe going back to the place where you live?: Yes      Need for Family Participation in Patient Care: Yes (Comment) Care giver support system in place?: Yes (comment)(SNF staff)   Criminal Activity/Legal Involvement Pertinent to Current Situation/Hospitalization: No - Comment as needed  Activities of Daily  Living      Permission Sought/Granted Permission sought to share information with : Facility Medical sales representative, Family Supports Permission granted to share information with : (Patient nonverbal/not oriented.)  Share Information with NAME: Harriette Bouillon  Permission granted to share info w AGENCY: [facility]  Permission granted to share info w Relationship: Daughter  Permission granted to share info w Contact Information: [phone]  Emotional Assessment Appearance:: Appears stated age Attitude/Demeanor/Rapport: Unable to Assess Affect (typically observed): Unable to Assess   Alcohol / Substance Use: Never Used Psych Involvement: No (comment)  Admission diagnosis:  RESPIRATORY FAILURE PNEUMONIA Patient Active Problem List   Diagnosis Date Noted  . SIRS (systemic inflammatory response syndrome) (HCC) 05/29/2018  . Chronic diastolic (congestive) heart failure (HCC) 05/29/2018  . History of completed stroke 05/29/2018  . Pressure injury of skin 05/29/2018  . Atrial fibrillation, chronic 02/20/2018  . Acute on chronic diastolic CHF (congestive heart failure) (HCC) 02/20/2018  . Status post insertion of percutaneous endoscopic gastrostomy (PEG) tube (HCC) 02/19/2018  . Status post tracheostomy (HCC)   . Acute respiratory failure with hypoxemia (HCC)   . Goals of care, counseling/discussion   . Palliative care by specialist   . Acute ischemic left MCA stroke (HCC) 01/27/2018   PCP:  Lynnell Catalan, MD Pharmacy:   [facility] [phone] - [place], [place] - [address] [place] [address]  Phone: [phone] Fax: [phone]     Social Determinants of Health (SDOH) Interventions    Readmission Risk Interventions No flowsheet data found.

## 2018-05-29 NOTE — Progress Notes (Signed)
CRITICAL VALUE ALERT  Critical Value:  Troponin 0.36  Date & Time Notied:  05/29/18 @ 0305  Provider Notified: textpage Dr. Toniann Fail  Orders Received/Actions taken: awaiting new orders  Victorino December, RN

## 2018-05-29 NOTE — Progress Notes (Signed)
Patient suctioned at this time. Mouth care done 

## 2018-05-29 NOTE — H&P (Addendum)
History and Physical    Kelli Vazquez SHF:[id number] DOB: May 03, 1929 DOA: 05/28/2018  PCP: Kipp Brood, MD  Patient coming from: Patient was transferred from [facility].  Chief Complaint: Fever shortness of breath.  HPI: Kelli Vazquez is a 85 y.o. female with history of hemorrhagic CVA in February 2020 requiring intubation following which patient had trach and PEG discharged to rehab was found to have fever and shortness of breath yesterday and was transferred to [facility].  Over there patient had labs done showed WBC count of 23 had a fever of 100.6.  Patient was on 10 L trach collar baseline not known.  Since patient had some mild discomfort in abdomen patient had CT chest and abdomen done per the ER physician was only showing mild effusion and atelectasis largely unremarkable.  Patient was started on empiric antibiotics for possible pneumonia versus tracheitis.  And since hospital had no stepdown or ICU bed was transferred to [facility] after discussing with pulmonary critical care at [facility].  ED Course: On my exam patient is nonverbal which is baseline as per the patient's family.  Has right-sided hemiplegia.  On trach and PEG.  Has right upper extremity edema and also mild lower extremity edema.  Patient admitted for SIRS respiratory failure possible pneumonia.  COVID-19 test was negative.  Repeat labs have been ordered.  Review of Systems: As per HPI, rest all negative.   Past Medical History:  Diagnosis Date  . CHF (congestive heart failure) ([place])   . Dementia ([place])   . Dysrhythmia   . Stroke [facility])     Past Surgical History:  Procedure Laterality Date  . IR GASTROSTOMY TUBE MOD SED  02/18/2018     has an unknown smoking status. She has never used smokeless tobacco. No history on file for alcohol and drug.  Allergies  Allergen Reactions  . Latex Rash  . Penicillins Rash    Did it involve swelling of the face/tongue/throat, SOB, or low BP? No Did it involve  sudden or severe rash/hives, skin peeling, or any reaction on the inside of your mouth or nose? No Did you need to seek medical attention at a hospital or doctor's office? No When did it last happen? If all above answers are "NO", may proceed with cephalosporin use.    Family History  Family history unknown: Yes    Prior to Admission medications   Medication Sig Start Date End Date Taking? Authorizing Provider  acetaminophen (TYLENOL) 325 MG tablet Take 2 tablets (650 mg total) by mouth every 4 (four) hours as needed for mild pain (or temp > 37.5 C (99.5 F)). 02/19/18   Kipp Brood, MD  Amino Acids-Protein Hydrolys (FEEDING SUPPLEMENT, PRO-STAT SUGAR FREE 64,) LIQD Place 30 mLs into feeding tube 2 (two) times daily. 02/19/18   Kipp Brood, MD  aspirin 81 MG chewable tablet Place 1 tablet (81 mg total) into feeding tube daily. 02/20/18   Kipp Brood, MD  atorvastatin (LIPITOR) 20 MG tablet Place 1 tablet (20 mg total) into feeding tube daily at 6 PM. 02/19/18   Kipp Brood, MD  bisacodyl (DULCOLAX) 10 MG suppository Place 1 suppository (10 mg total) rectally daily as needed for moderate constipation. 02/19/18   Kipp Brood, MD  chlorhexidine gluconate, MEDLINE KIT, (PERIDEX) 0.12 % solution 15 mLs by Mouth Rinse route 2 (two) times daily. 02/19/18   Kipp Brood, MD  diltiazem (CARDIZEM) 10 mg/ml oral suspension Place 3 mLs (30 mg total) into feeding tube every  6 (six) hours. 02/19/18   Kipp Brood, MD  docusate (COLACE) 50 MG/5ML liquid Place 10 mLs (100 mg total) into feeding tube 2 (two) times daily. 02/19/18   Kipp Brood, MD  enoxaparin (LOVENOX) 40 MG/0.4ML injection Inject 0.4 mLs (40 mg total) into the skin daily. 02/20/18   Kipp Brood, MD  insulin aspart (NOVOLOG) 100 UNIT/ML injection Inject 1-3 Units into the skin every 4 (four) hours. 02/19/18   Kipp Brood, MD  mouth rinse LIQD solution 15 mLs by Mouth Rinse route every morning. 02/19/18   Kipp Brood, MD   Nutritional Supplements (FEEDING SUPPLEMENT, OSMOLITE 1.2 CAL,) LIQD Place 1,000 mLs into feeding tube continuous. 02/19/18   Kipp Brood, MD  pantoprazole sodium (PROTONIX) 40 mg/20 mL PACK Place 20 mLs (40 mg total) into feeding tube daily. 02/20/18   Kipp Brood, MD  polyethylene glycol (MIRALAX / GLYCOLAX) packet Place 17 g into feeding tube 2 (two) times daily. 02/19/18   Kipp Brood, MD  torsemide (DEMADEX) 20 MG tablet Place 2 tablets (40 mg total) into feeding tube daily. 02/20/18   Kipp Brood, MD  Water For Irrigation, Sterile (FREE WATER) SOLN Place 400 mLs into feeding tube 2 times daily at 12 noon and 4 pm. 02/20/18   Kipp Brood, MD    Physical Exam: Vitals:   05/28/18 2354 05/29/18 0011  BP:  109/75  Pulse: 85 87  Resp: 18 20  Temp:  98.9 F (37.2 C)  TempSrc:  Oral  SpO2: 99% 100%  Weight:  72 kg      Constitutional: Moderately built and nourished. Vitals:   05/28/18 2354 05/29/18 0011  BP:  109/75  Pulse: 85 87  Resp: 18 20  Temp:  98.9 F (37.2 C)  TempSrc:  Oral  SpO2: 99% 100%  Weight:  72 kg   Eyes: Anicteric no pallor. ENMT: No discharge from the ears eyes nose or mouth. Neck: Trach collar seen. Respiratory: No rhonchi or crepitations. Cardiovascular: S1-S2 heard. Abdomen: Soft nontender PEG tube seen. Musculoskeletal: Mild edema of the lower extremities and right upper extremity. Skin: No rash. Neurologic: Does not follow commands.  But alert awake. Psychiatric: Does not follow commands.   Labs on Admission: I have personally reviewed following labs and imaging studies  CBC: No results for input(s): WBC, NEUTROABS, HGB, HCT, MCV, PLT in the last 168 hours. Basic Metabolic Panel: No results for input(s): NA, K, CL, CO2, GLUCOSE, BUN, CREATININE, CALCIUM, MG, PHOS in the last 168 hours. GFR: CrCl cannot be calculated (Patient's most recent lab result is older than the maximum 21 days allowed.). Liver Function Tests: No results  for input(s): AST, ALT, ALKPHOS, BILITOT, PROT, ALBUMIN in the last 168 hours. No results for input(s): LIPASE, AMYLASE in the last 168 hours. No results for input(s): AMMONIA in the last 168 hours. Coagulation Profile: No results for input(s): INR, PROTIME in the last 168 hours. Cardiac Enzymes: No results for input(s): CKTOTAL, CKMB, CKMBINDEX, TROPONINI in the last 168 hours. BNP (last 3 results) No results for input(s): PROBNP in the last 8760 hours. HbA1C: No results for input(s): HGBA1C in the last 72 hours. CBG: No results for input(s): GLUCAP in the last 168 hours. Lipid Profile: No results for input(s): CHOL, HDL, LDLCALC, TRIG, CHOLHDL, LDLDIRECT in the last 72 hours. Thyroid Function Tests: No results for input(s): TSH, T4TOTAL, FREET4, T3FREE, THYROIDAB in the last 72 hours. Anemia Panel: No results for input(s): VITAMINB12, FOLATE, FERRITIN, TIBC, IRON, RETICCTPCT in the last 72 hours.  Urine analysis:    Component Value Date/Time   COLORURINE YELLOW 03/18/2018 1911   APPEARANCEUR HAZY (A) 03/18/2018 1911   LABSPEC 1.015 03/18/2018 1911   PHURINE 7.0 03/18/2018 1911   GLUCOSEU NEGATIVE 03/18/2018 1911   HGBUR NEGATIVE 03/18/2018 1911   BILIRUBINUR NEGATIVE 03/18/2018 1911   KETONESUR NEGATIVE 03/18/2018 1911   PROTEINUR NEGATIVE 03/18/2018 1911   NITRITE NEGATIVE 03/18/2018 1911   LEUKOCYTESUR NEGATIVE 03/18/2018 1911   Sepsis Labs: _0 (procalcitonin:4,lacticidven:4) )No results found for this or any previous visit (from the past 240 hour(s)).   Radiological Exams on Admission: No results found.  EKG: Independently reviewed.  Atrial flutter rate controlled.  Assessment/Plan Principal Problem:   SIRS (systemic inflammatory response syndrome) (HCC) Active Problems:   Status post tracheostomy ([place])   Status post insertion of percutaneous endoscopic gastrostomy (PEG) tube (HCC)   Atrial fibrillation, chronic   Chronic diastolic (congestive) heart  failure ([place])   History of completed stroke    1. SIRS/acute respiratory failure with hypoxia -patient is presently on 10 L oxygen 60% FiO2 baseline not known.  Did discuss with pulmonary critical care will be consulting.  Will repeat chest x-ray on empiric antibiotics for possible pneumonia tracheitis. 2. History of hemorrhagic CVA on aspirin. 3. History of atrial flutter/fibrillation presently on Cardizem and Coreg.  Not on anticoagulation probably secondary to hemorrhagic CVA.  Will need to check patient's medications at the facility if patient was on anticoagulant previously Georgina Quint will discuss with cardiology.  4. Chronic trach and PEG on Osmolite. 5. Chronic diastolic CHF on torsemide.  Last EF measured was in January 2020.  55 to 65%.  Will check BNP and troponin.. 6. Anemia appears to be chronic.  Follow CBC.  All lab results and CAT scan finding results were obtained from ER physician through discussion through phone.  I do not have any physical copy or access to the reports yet.  Repeat labs have been ordered including CBC metabolic panel EKG repeat chest x-ray troponin.  Since patient has right upper extremity swelling I have ordered Doppler to rule out DVT.   DVT prophylaxis: Lovenox. Code Status: Full code as confirmed with patient's daughter with whom I discussed. Family Communication: Patient's daughter. Disposition Plan: Back to facility when stable. Consults called: Pulmonary critical care. Admission status: Inpatient.   Rise Patience MD [facility] Pager [phone].  If 7PM-7AM, please contact night-coverage [email or web address] Password [email or web address]  05/29/2018, 1:07 AM

## 2018-05-29 NOTE — Progress Notes (Signed)
Patient suctioned at this time.

## 2018-05-29 NOTE — Progress Notes (Signed)
Pharmacy Antibiotic Note  Kelli Vazquez is a 85 y.o. female with h/o tracheostomy admitted from [facility] on 05/28/2018 with fevers/SOB/HCAP.  Pharmacy has been consulted for Vancomycin and Cefepime dosing.  Vancomycin 1250 mg IV given in ED at [facility] at 6 pm 5/26   SCr 0.9  WBC 23.8  Plan: Vancomycin 750 mg IV q24h Cefepime 2 g IV q12h  Weight: 158 lb 11.7 oz (72 kg)  Temp (24hrs), Avg:98.9 F (37.2 C), Min:98.9 F (37.2 C), Max:98.9 F (37.2 C)   No results for input(s): WBC, CREATININE, LATICACIDVEN, VANCOTROUGH, VANCOPEAK, VANCORANDOM, GENTTROUGH, GENTPEAK, GENTRANDOM, TOBRATROUGH, TOBRAPEAK, TOBRARND, AMIKACINPEAK, AMIKACINTROU, AMIKACIN in the last 168 hours.  CrCl cannot be calculated (Patient's most recent lab result is older than the maximum 21 days allowed.).    Allergies  Allergen Reactions  . Latex Rash  . Penicillins Rash    Did it involve swelling of the face/tongue/throat, SOB, or low BP? No Did it involve sudden or severe rash/hives, skin peeling, or any reaction on the inside of your mouth or nose? No Did you need to seek medical attention at a hospital or doctor's office? No When did it last happen? If all above answers are "NO", may proceed with cephalosporin use.    Eddie Candle 05/29/2018 1:21 AM

## 2018-05-29 NOTE — Progress Notes (Signed)
Patient suctioned at this time. Mouth care done

## 2018-05-29 NOTE — Progress Notes (Signed)
RT unable to get sample for respiratory culture.  RT will continue to monitor.

## 2018-05-29 NOTE — Progress Notes (Signed)
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  Was also on 10 L trach collar with unknown baseline.  CTA of the chest and abdomen showed mild effusion and atelectasis otherwise unremarkable.  Started on empiric antibiotics and transferred to [facility].  COVID test-negative. Labs suggested slightly elevated BNP, troponin of 0.36 with EKG showing atrial flutter on lateral T wave inversions which is largely unchanged from previous EKGs.  Elevated BUN/creatinine ratio.  Sitting up in the bed no complaints but does have some exertional resp discomfort. Overall mentation is at baseline.   Vitals are overall stable on 8L Trach Collar durng my eval. Diffuse mild resp coarse BS  Check ProCal, Add Incentive spirometry, Replete K, Gentle Hydration. Monitor urine output. Cont Abx. Trach Asp ordered. D/c Torsemide 20mg  po bid for one day.   Call with question as needed.  Stephania Fragmin MD [facility]

## 2018-05-29 NOTE — Progress Notes (Signed)
Spoke with patient's daughter Marylu Lund and updated her on patient status.

## 2018-05-29 NOTE — Progress Notes (Signed)
Pt has been seen by [facility] hospitalist. New orders have been placed. Spoke with patients daughter, Ms. DeLeon [phone]) who lives in [place]. Daughter aware that pt was in [facility]. I informed her that pt was transferred her about an hour ago and is on [facility] in room 20. Will pass her information on to day shift RN and place sticky note for MD. Will continue to monitor. Victorino December, RN

## 2018-05-29 NOTE — Consult Note (Signed)
NAME:  Kelli Vazquez, MRN:  [id number], DOB:  16-Feb-1929, LOS: 1 ADMISSION DATE:  05/28/2018, CONSULTATION DATE:  05/29/2018 REFERRING MD:  Dr. Hal Hope, CHIEF COMPLAINT:  Hypoxia  Brief History   85 yoF with chronic trach/ PEG sent from rehab with fever and leukocytosis with increased O2 requirement, on 10L trach collar and PCCM consulted  History of present illness   HPI obtained from medical chart review as patient is nonverbal with trach and hx of dementia at baseline.   85 year old female with history of Left MCA stroke s/p hemorrhagic conversion in 02/2018 s/p trach and peg, HFpEF, dementia, Afib on ASA only presenting from rehab in [facility] with complaints of fever and leukocytosis.    She was found to have WBC of 23 and temp of 100.6, requiring 10 L via trach collar.  It is unknown her baseline, but on discharge from [facility] in February 2020, she was on still requiring MV and 30% FiO2.  Her ED workup, CTA chest was negative for PE with small trace bilateral effusions and dependent atelecatasis.  CT abd/pelvis was negative. Her  COVID test was negative.  She was started on empiric cefepime for possible pneumonia vs tracheitis.  Her labs here show a BNP of 423 and trop 0.36- 0.39, normal lactic and PCT 0.42.  PCCM was consulted given her increased O2 requirements.   Past Medical History  Left MCA stroke s/p hemorrhagic conversion in 02/2018 s/p trach and peg, HFpEF, dementia, Afib on ASA only  Significant Hospital Events   5/27 Admit/ tx from [facility]  Consults:   Procedures:  PTA 02/12/2018 trach 6 shiley non-cuffed >> 02/18/2018 PEG >>  Significant Diagnostic Tests:  5/27 CTA PE  (at [facility]) >> neg PE  5/27 CT abd/ pelvis ([facility])  Micro Data:  5/27 trach asp >>  Antimicrobials:  5/27 cefepime >>  Interim history/subjective:  Currently weaned to 35%/ 8L  trach collar   Objective   Blood pressure 113/65, pulse 86, temperature 98.6 F (37 C), temperature source Oral, resp. rate 20,  weight 72 kg, SpO2 95 %.    FiO2 (%):  [35 %-60 %] 35 %  No intake or output data in the 24 hours ending 05/29/18 1013 Filed Weights   05/29/18 0011 05/29/18 0415  Weight: 72 kg 72 kg   Examination: General:  Chronically ill appearing elderly female in NAD HEENT: MM pink/moist, pupils 5/reactive, anicteric, midline uncuffed shiley 6 trach with clean dressing, no current secretions Neuro: Eyes open, does not f/c, R side flacid, minimal movement of left hand/ toes CV: irir PULM: even/non-labored, mildly tachypneic, lungs bilaterally clear anteriorly, faint bibasilar rales BM:WUXL, non-tender, bs active, Gtube, purwick  Extremities: warm/dry, +1-2 LE edema, RUE edema Skin: no rashes, bruising to lower abd  Resolved Hospital Problem list    Assessment & Plan:  Acute on chronic hypoxic respiratory failure with chronic trach/ PEG - ddx possible aspiration vs atelectasis and acute on chronic HF - negative for PE at [facility] P:  Patient is current doing well on trach collar 35% at this time Goal sat >92-99% Her mental status is at baseline and currently in no acute distress.  Do not need ABG at this time and ok to stay in PCU at this time Would continue abx at this time given PCT Send trach asp Trend CXR Would also send UA given leukocytosis/ reported fever to rule out other etiologies Diuresis per primary team Ongoing trach care   Remainder per primary team.  Nothing further to add.  PCCM will sign off.  Please do not hesitate to call us back if we can be of any further assistance.  Best practice:  Diet: PEG tube Pain/Anxiety/Delirium protocol (if indicated): n/a VAP protocol (if indicated): n/a DVT prophylaxis: lovenox GI prophylaxis: PPI Glucose control: SSI sensitive Mobility: BR Code Status: Full  Family Communication: per primary Disposition: PCU  Labs   CBC: Recent Labs  Lab 05/29/18 0207  WBC 23.8*  NEUTROABS 20.1*  HGB 10.5*  HCT 31.7*  MCV 88.3  PLT 363     Basic Metabolic Panel: Recent Labs  Lab 05/29/18 0207  NA 131*  K 3.3*  CL 80*  CO2 33*  GLUCOSE 98  BUN 61*  CREATININE 0.98  CALCIUM 9.0   GFR: Estimated Creatinine Clearance: 39.2 mL/min (by C-G formula based on SCr of 0.98 mg/dL). Recent Labs  Lab 05/29/18 0126 05/29/18 0207 05/29/18 0401 05/29/18 0745  PROCALCITON  --   --   --  0.42  WBC  --  23.8*  --   --   LATICACIDVEN 1.6  --  1.3  --     Liver Function Tests: Recent Labs  Lab 05/29/18 0207  AST 72*  ALT 62*  ALKPHOS 138*  BILITOT 0.8  PROT 7.5  ALBUMIN 2.4*   No results for input(s): LIPASE, AMYLASE in the last 168 hours. No results for input(s): AMMONIA in the last 168 hours.  ABG    Component Value Date/Time   PHART 7.514 (H) 02/11/2018 0236   PCO2ART 36.3 02/11/2018 0236   PO2ART 95.3 02/11/2018 0236   HCO3 29.1 (H) 02/11/2018 0236   TCO2 32 02/10/2018 0432   O2SAT 97.7 02/11/2018 0236     Coagulation Profile: No results for input(s): INR, PROTIME in the last 168 hours.  Cardiac Enzymes: Recent Labs  Lab 05/29/18 0207 05/29/18 0518  TROPONINI 0.36* 0.39*    HbA1C: Hgb A1c MFr Bld  Date/Time Value Ref Range Status  02/20/2018 03:56 AM 6.1 (H) 4.8 - 5.6 % Final    Comment:    (NOTE)         Prediabetes: 5.7 - 6.4         Diabetes: >6.4         Glycemic control for adults with diabetes: <7.0   01/28/2018 06:39 AM 5.5 4.8 - 5.6 % Final    Comment:    (NOTE) Pre diabetes:          5.7%-6.4% Diabetes:              >6.4% Glycemic control for   <7.0% adults with diabetes     CBG: Recent Labs  Lab 05/29/18 0113 05/29/18 0413 05/29/18 0800  GLUCAP 116* 115* 192*    Review of Systems:   Unable as patient is non- verbal   Past Medical History  She,  has a past medical history of CHF (congestive heart failure) ([place]), Dementia ([place]), Dysrhythmia, and Stroke ([place]).   Surgical History    Past Surgical History:  Procedure Laterality Date  . IR GASTROSTOMY TUBE MOD SED   02/18/2018     Social History   has an unknown smoking status. She has never used smokeless tobacco.   Family History   Her Family history is unknown by patient.   Allergies Allergies  Allergen Reactions  . Latex Rash  . Penicillins Rash    Did it involve swelling of the face/tongue/throat, SOB, or low BP? No Did it involve sudden or  severe rash/hives, skin peeling, or any reaction on the inside of your mouth or nose? No Did you need to seek medical attention at a hospital or doctor's office? No When did it last happen? If all above answers are "NO", may proceed with cephalosporin use.     Home Medications  Prior to Admission medications   Medication Sig Start Date End Date Taking? Authorizing Provider  acetaminophen (TYLENOL) 325 MG tablet Take 2 tablets (650 mg total) by mouth every 4 (four) hours as needed for mild pain (or temp > 37.5 C (99.5 F)). 02/19/18   Kipp Brood, MD  Amino Acids-Protein Hydrolys (FEEDING SUPPLEMENT, PRO-STAT SUGAR FREE 64,) LIQD Place 30 mLs into feeding tube 2 (two) times daily. 02/19/18   Kipp Brood, MD  aspirin 81 MG chewable tablet Place 1 tablet (81 mg total) into feeding tube daily. 02/20/18   Kipp Brood, MD  atorvastatin (LIPITOR) 20 MG tablet Place 1 tablet (20 mg total) into feeding tube daily at 6 PM. 02/19/18   Kipp Brood, MD  bisacodyl (DULCOLAX) 10 MG suppository Place 1 suppository (10 mg total) rectally daily as needed for moderate constipation. 02/19/18   Kipp Brood, MD  chlorhexidine gluconate, MEDLINE KIT, (PERIDEX) 0.12 % solution 15 mLs by Mouth Rinse route 2 (two) times daily. 02/19/18   Kipp Brood, MD  diltiazem (CARDIZEM) 10 mg/ml oral suspension Place 3 mLs (30 mg total) into feeding tube every 6 (six) hours. 02/19/18   Kipp Brood, MD  docusate (COLACE) 50 MG/5ML liquid Place 10 mLs (100 mg total) into feeding tube 2 (two) times daily. 02/19/18   Kipp Brood, MD  enoxaparin (LOVENOX) 40 MG/0.4ML injection  Inject 0.4 mLs (40 mg total) into the skin daily. 02/20/18   Kipp Brood, MD  insulin aspart (NOVOLOG) 100 UNIT/ML injection Inject 1-3 Units into the skin every 4 (four) hours. 02/19/18   Kipp Brood, MD  mouth rinse LIQD solution 15 mLs by Mouth Rinse route every morning. 02/19/18   Kipp Brood, MD  Nutritional Supplements (FEEDING SUPPLEMENT, OSMOLITE 1.2 CAL,) LIQD Place 1,000 mLs into feeding tube continuous. 02/19/18   Kipp Brood, MD  pantoprazole sodium (PROTONIX) 40 mg/20 mL PACK Place 20 mLs (40 mg total) into feeding tube daily. 02/20/18   Kipp Brood, MD  polyethylene glycol (MIRALAX / GLYCOLAX) packet Place 17 g into feeding tube 2 (two) times daily. 02/19/18   Kipp Brood, MD  torsemide (DEMADEX) 20 MG tablet Place 2 tablets (40 mg total) into feeding tube daily. 02/20/18   Kipp Brood, MD  Water For Irrigation, Sterile (FREE WATER) SOLN Place 400 mLs into feeding tube 2 times daily at 12 noon and 4 pm. 02/20/18   Kipp Brood, MD         Kennieth Rad, MSN, AGACNP-BC [facility] Pgr: [phone] or if no answer [phone] 05/29/2018, 10:13 AM

## 2018-05-30 LAB — BASIC METABOLIC PANEL
Anion gap: 16 — ABNORMAL HIGH (ref 5–15)
BUN: 75 mg/dL — ABNORMAL HIGH (ref 8–23)
CO2: 31 mmol/L (ref 22–32)
Calcium: 8.4 mg/dL — ABNORMAL LOW (ref 8.9–10.3)
Chloride: 87 mmol/L — ABNORMAL LOW (ref 98–111)
Creatinine, Ser: 1 mg/dL (ref 0.44–1.00)
GFR calc Af Amer: 59 mL/min — ABNORMAL LOW (ref >60)
GFR calc non Af Amer: 51 mL/min — ABNORMAL LOW (ref >60)
Glucose, Bld: 116 mg/dL — ABNORMAL HIGH (ref 70–99)
Potassium: 4 mmol/L (ref 3.5–5.1)
Sodium: 134 mmol/L — ABNORMAL LOW (ref 135–145)

## 2018-05-30 LAB — GLUCOSE, CAPILLARY
Glucose-Capillary: 164 mg/dL — ABNORMAL HIGH (ref 70–99)
Glucose-Capillary: 173 mg/dL — ABNORMAL HIGH (ref 70–99)
Glucose-Capillary: 167 mg/dL — ABNORMAL HIGH (ref 70–99)
Glucose-Capillary: 157 mg/dL — ABNORMAL HIGH (ref 70–99)
Glucose-Capillary: 177 mg/dL — ABNORMAL HIGH (ref 70–99)
Glucose-Capillary: 203 mg/dL — ABNORMAL HIGH (ref 70–99)

## 2018-05-30 LAB — CBC
HCT: 28.8 % — ABNORMAL LOW (ref 36.0–46.0)
Hemoglobin: 9.6 g/dL — ABNORMAL LOW (ref 12.0–15.0)
MCH: 29.3 pg (ref 26.0–34.0)
MCHC: 33.3 g/dL (ref 30.0–36.0)
MCV: 87.8 fL (ref 80.0–100.0)
Platelets: 343 10*3/uL (ref 150–400)
RBC: 3.28 MIL/uL — ABNORMAL LOW (ref 3.87–5.11)
RDW: 17.2 % — ABNORMAL HIGH (ref 11.5–15.5)
WBC: 16.2 10*3/uL — ABNORMAL HIGH (ref 4.0–10.5)
nRBC: 0 % (ref 0.0–0.2)

## 2018-05-30 LAB — MAGNESIUM: Magnesium: 2.4 mg/dL (ref 1.7–2.4)

## 2018-05-30 LAB — PROCALCITONIN: Procalcitonin: 0.47 ng/mL

## 2018-05-30 LAB — BRAIN NATRIURETIC PEPTIDE: B Natriuretic Peptide: 495.4 pg/mL — ABNORMAL HIGH (ref 0.0–100.0)

## 2018-05-30 MED ORDER — SODIUM CHLORIDE 0.9 % IV SOLN
INTRAVENOUS | Status: AC
  Administered 2018-05-30 – 2018-05-31 (×2): via INTRAVENOUS

## 2018-05-30 MED ORDER — OSMOLITE 1.2 CAL PO LIQD
1000.0000 mL | ORAL | Status: DC
  Administered 2018-05-30 – 2018-06-03 (×4): 1000 mL
  Filled 2018-05-30 (×8): qty 1000

## 2018-05-30 NOTE — Plan of Care (Signed)
  Problem: Clinical Measurements: Goal: Ability to maintain clinical measurements within normal limits will improve Outcome: Progressing   Problem: Clinical Measurements: Goal: Respiratory complications will improve Outcome: Progressing   Problem: Clinical Measurements: Goal: Cardiovascular complication will be avoided Outcome: Progressing   

## 2018-05-30 NOTE — Progress Notes (Signed)
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 fib, diastolic CHF presents the ER with complaints of fevers and shortness of breath.  Patient was found to be febrile and elevated WBC of 23.  SHe was also having higher oxygen requirement with 10 L trach collar.  Suspicion for pneumonia versus bronchial tracheitis therefore initially started on broad-spectrum antibiotic given previous history of Pseudomonas.  Tracheal aspirate was sent.  Pulmonary was consulted.  Call with test negative.   Assessment & Plan:   Principal Problem:   SIRS (systemic inflammatory response syndrome) (HCC) Active Problems:   Status post tracheostomy (HCC)   Status post insertion of percutaneous endoscopic gastrostomy (PEG) tube (HCC)   Atrial fibrillation, chronic   Chronic diastolic (congestive) heart failure (HCC)   History of completed stroke   Pressure injury of skin  Sepsis secondary to healthcare acquired pneumonia Acute on chronic respiratory failure with hypoxia requiring 10 L oxygen with trach collar - Patient chronically has trach in place.  There is concerns of bronchial tracheitis versus pneumonia.  Given elevated WBC we will continue broad-spectrum antibiotics at this time.  Patient has previous history of Pseudomonas.  Tracheal aspirates have been sent, follow-up culture data.  Appreciate pulmonary input -Incentive spirometry and flutter valve.  Bronchodilators PRN.  Continue frequent suctioning. -Procalcitonin 0.47, BNP 495.  History of hemorrhagic CVA -On aspirin.  History of atrial flutter/fibrillation - Continue home regimen of Cardizem and Coreg.  Not on anticoagulation due to hemorrhagic CVA.  Chronic diastolic congestive heart failure -will hold torsemide for another and continue gentle hydration.   Anemia of  chronic disease -Hemoglobin appears to be stable.  Continue to monitor this.  DVT prophylaxis: Lovenox full code Code Status: Full code Family Communication: Spoke with Naby, her daughter.  Disposition Plan: Still to be determined until her oxygen requirement has gone down.  Continues to have thick secretion in the meantime follow-up micro data  Consultants:   Pulmonary  Procedures:   None  Antimicrobials:   Cefepime   Subjective: Patient remains in the bed nonverbal. No complaints.  Still having thick secretions. O2 requirement improved  Review of Systems Otherwise negative except as per HPI, including: General = no fevers, chills, dizziness, malaise, fatigue HEENT/EYES = negative for pain, redness, loss of vision, double vision, blurred vision, loss of hearing, sore throat, hoarseness, dysphagia Cardiovascular= negative for chest pain, palpitation, murmurs, lower extremity swelling Respiratory/lungs= negative for hemoptysis, wheezing, mucus production Gastrointestinal= negative for nausea, vomiting,, abdominal pain, melena, hematemesis Genitourinary= negative for Dysuria, Hematuria, Change in Urinary Frequency MSK = Negative for arthralgia, myalgias, Back Pain, Joint swelling  Neurology= Negative for headache, seizures, numbness, tingling  Psychiatry= Negative for anxiety, depression, suicidal and homocidal ideation Allergy/Immunology= Medication/Food allergy as listed  Skin= Negative for Rash, lesions, ulcers, itching  Objective: Vitals:   05/30/18 0347 05/30/18 0446 05/30/18 1049 05/30/18 1135  BP:  122/71 124/63   Pulse: 83 79 84 83  Resp: (!) 28 (!) 33  (!) 28  Temp:  98.6 F (37 C)    TempSrc:  Oral    SpO2: 98% 100%  98%  Weight:  74.9 kg    Height:        Intake/Output Summary (Last 24 hours) at 05/30/2018 1242 Last data filed at 05/30/2018 0700 Gross per 24 hour  Intake 3109.89  ml  Output 250 ml  Net 2859.89 ml   Filed Weights   05/29/18 0011  05/29/18 0415 05/30/18 0446  Weight: 72 kg 72 kg 74.9 kg    Examination:  Constitutional: NAD, calm, comfortable; 4L trach collar in place. Eyes: PERRL, lids and conjunctivae normal ENMT: Mucous membranes are moist. Posterior pharynx clear of any exudate or lesions.Normal dentition.  Neck: normal, supple, no masses, no thyromegaly Respiratory: anterior chest wall coarse BS Cardiovascular: Regular rate and rhythm, no murmurs / rubs / gallops. No extremity edema. 2+ pedal pulses. No carotid bruits.  Abdomen: no tenderness, no masses palpated. No hepatosplenomegaly. Bowel sounds positive. +peg in place.  Musculoskeletal: no clubbing / cyanosis. No joint deformity upper and lower extremities. Good ROM, no contractures. Normal muscle tone.  Skin: no rashes, lesions, ulcers. No induration Neurologic: CN 2-12 grossly intact. Sensation intact, DTR normal. Strength 4/5 in all 4.  Psychiatric: Normal judgment and insight. Alert and oriented x 3. Normal mood.    Data Reviewed:   CBC: Recent Labs  Lab 05/29/18 0207 05/30/18 0301  WBC 23.8* 16.2*  NEUTROABS 20.1*  --   HGB 10.5* 9.6*  HCT 31.7* 28.8*  MCV 88.3 87.8  PLT 363 343   Basic Metabolic Panel: Recent Labs  Lab 05/29/18 0207 05/30/18 0301  NA 131* 134*  K 3.3* 4.0  CL 80* 87*  CO2 33* 31  GLUCOSE 98 116*  BUN 61* 75*  CREATININE 0.98 1.00  CALCIUM 9.0 8.4*  MG  --  2.4   GFR: Estimated Creatinine Clearance: 39.1 mL/min (by C-G formula based on SCr of 1 mg/dL). Liver Function Tests: Recent Labs  Lab 05/29/18 0207  AST 72*  ALT 62*  ALKPHOS 138*  BILITOT 0.8  PROT 7.5  ALBUMIN 2.4*   No results for input(s): LIPASE, AMYLASE in the last 168 hours. No results for input(s): AMMONIA in the last 168 hours. Coagulation Profile: No results for input(s): INR, PROTIME in the last 168 hours. Cardiac Enzymes: Recent Labs  Lab 05/29/18 0207 05/29/18 0518 05/29/18 1058 05/29/18 1559  TROPONINI 0.36* 0.39* 0.33*  0.28*   BNP (last 3 results) No results for input(s): PROBNP in the last 8760 hours. HbA1C: No results for input(s): HGBA1C in the last 72 hours. CBG: Recent Labs  Lab 05/29/18 2017 05/29/18 2342 05/30/18 0450 05/30/18 0824 05/30/18 1157  GLUCAP 169* 170* 164* 173* 167*   Lipid Profile: No results for input(s): CHOL, HDL, LDLCALC, TRIG, CHOLHDL, LDLDIRECT in the last 72 hours. Thyroid Function Tests: No results for input(s): TSH, T4TOTAL, FREET4, T3FREE, THYROIDAB in the last 72 hours. Anemia Panel: No results for input(s): VITAMINB12, FOLATE, FERRITIN, TIBC, IRON, RETICCTPCT in the last 72 hours. Sepsis Labs: Recent Labs  Lab 05/29/18 0126 05/29/18 0401 05/29/18 0745 05/30/18 0301  PROCALCITON  --   --  0.42 0.47  LATICACIDVEN 1.6 1.3  --   --     Recent Results (from the past 240 hour(s))  SARS Coronavirus 2 (CEPHEID - Performed in [facility] hospital lab), Hosp Order     Status: None   Collection Time: 05/29/18  4:10 AM  Result Value Ref Range Status   SARS Coronavirus 2 NEGATIVE NEGATIVE Final    Comment: (NOTE) If result is NEGATIVE SARS-CoV-2 target nucleic acids are NOT DETECTED. The SARS-CoV-2 RNA is generally detectable in upper and lower  respiratory specimens during the acute phase of infection. The lowest  concentration of SARS-CoV-2 viral copies this assay can detect is 250  copies / mL. A negative result does not preclude SARS-CoV-2 infection  and should not be used as the sole basis for treatment or other  patient management decisions.  A negative result may occur with  improper specimen collection / handling, submission of specimen other  than nasopharyngeal swab, presence of viral mutation(s) within the  areas targeted by this assay, and inadequate number of viral copies  (<250 copies / mL). A negative result must be combined with clinical  observations, patient history, and epidemiological information. If result is POSITIVE SARS-CoV-2 target  nucleic acids are DETECTED. The SARS-CoV-2 RNA is generally detectable in upper and lower  respiratory specimens dur ing the acute phase of infection.  Positive  results are indicative of active infection with SARS-CoV-2.  Clinical  correlation with patient history and other diagnostic information is  necessary to determine patient infection status.  Positive results do  not rule out bacterial infection or co-infection with other viruses. If result is PRESUMPTIVE POSTIVE SARS-CoV-2 nucleic acids MAY BE PRESENT.   A presumptive positive result was obtained on the submitted specimen  and confirmed on repeat testing.  While 2019 novel coronavirus  (SARS-CoV-2) nucleic acids may be present in the submitted sample  additional confirmatory testing may be necessary for epidemiological  and / or clinical management purposes  to differentiate between  SARS-CoV-2 and other Sarbecovirus currently known to infect humans.  If clinically indicated additional testing with an alternate test  methodology [phone]) is advised. The SARS-CoV-2 RNA is generally  detectable in upper and lower respiratory sp ecimens during the acute  phase of infection. The expected result is Negative. Fact Sheet for Patients:  BoilerBrush.com.cy Fact Sheet for Healthcare Providers: https://pope.com/ This test is not yet approved or cleared by the [place] FDA and has been authorized for detection and/or diagnosis of SARS-CoV-2 by FDA under an Emergency Use Authorization (EUA).  This EUA will remain in effect (meaning this test can be used) for the duration of the COVID-19 declaration under Section 564(b)(1) of the Act, 21 U.S.C. section 360bbb-3(b)(1), unless the authorization is terminated or revoked sooner. Performed at [facility], [address], [place], [place] [address]   MRSA PCR Screening     Status: None   Collection Time: 05/29/18 11:38 AM  Result  Value Ref Range Status   MRSA by PCR NEGATIVE NEGATIVE Final    Comment:        The GeneXpert MRSA Assay (FDA approved for NASAL specimens only), is one component of a comprehensive MRSA colonization surveillance program. It is not intended to diagnose MRSA infection nor to guide or monitor treatment for MRSA infections. Performed at [facility], [address] [place], [place] [address]   Culture, respiratory (non-expectorated)     Status: None (Preliminary result)   Collection Time: 05/29/18  3:40 PM  Result Value Ref Range Status   Specimen Description TRACHEAL ASPIRATE  Final   Special Requests NONE  Final   Gram Stain   Final    ABUNDANT WBC PRESENT, PREDOMINANTLY PMN NO ORGANISMS SEEN    Culture   Final    NO GROWTH < 24 HOURS Performed at [facility], [address] [place], [place] [address]    Report Status PENDING  Incomplete         Radiology Studies: Dg Chest Port 1 View  Result Date: 05/29/2018 CLINICAL DATA:  Shortness of breath EXAM: PORTABLE CHEST 1 VIEW COMPARISON:  03/17/2018 FINDINGS: Tracheostomy tube  in place. Retrocardiac opacification that is hazy. There is streaky left perihilar density. No pneumothorax. Chronic cardiomegaly. IMPRESSION: Retrocardiac atelectasis or pneumonia with probable pleural fluid. Electronically Signed   By: Marnee Spring M.D.   On: 05/29/2018 04:55   Vas [place] Upper Extremity Venous Duplex  Result Date: 05/29/2018 UPPER VENOUS STUDY  Indications: Edema Performing Technologist: Jeb Levering RDMS, RVT  Examination Guidelines: A complete evaluation includes B-mode imaging, spectral Doppler, color Doppler, and power Doppler as needed of all accessible portions of each vessel. Bilateral testing is considered an integral part of a complete examination. Limited examinations for reoccurring indications may be performed as noted.  Right Findings: +----------+------------+---------+-----------+----------+---------------+  RIGHT     CompressiblePhasicitySpontaneousProperties    Summary     +----------+------------+---------+-----------+----------+---------------+ IJV                                                 unable to image +----------+------------+---------+-----------+----------+---------------+ Subclavian    Full       Yes       Yes                              +----------+------------+---------+-----------+----------+---------------+ Axillary      Full       Yes       Yes                              +----------+------------+---------+-----------+----------+---------------+ Brachial      Full       Yes       Yes                              +----------+------------+---------+-----------+----------+---------------+ Radial        Full                                                  +----------+------------+---------+-----------+----------+---------------+ Ulnar         Full                                                  +----------+------------+---------+-----------+----------+---------------+ Cephalic      Full                                                  +----------+------------+---------+-----------+----------+---------------+ Basilic       Full                                                  +----------+------------+---------+-----------+----------+---------------+ unable to image right IJV due to trach bandages  Left Findings: +----+------------+---------+-----------+----------+--------------+ LEFTCompressiblePhasicitySpontaneousProperties   Summary     +----+------------+---------+-----------+----------+--------------+ IJV  Not visualized +----+------------+---------+-----------+----------+--------------+  Summary:  Right: No evidence of deep vein thrombosis in the upper extremity. No evidence of superficial vein thrombosis in the upper extremity.  *See table(s) above for measurements and  observations.  Diagnosing physician: Coral ElseVance Brabham MD Electronically signed by Coral ElseVance Brabham MD on 05/29/2018 at 1:09:24 PM.    Final         Scheduled Meds: . aspirin  81 mg Per Tube Daily  . carvedilol  3.125 mg Per Tube BID WC  . chlorhexidine  15 mL Mouth Rinse BID  . diltiazem  30 mg Per Tube Q6H  . docusate  100 mg Per Tube BID  . enoxaparin (LOVENOX) injection  40 mg Subcutaneous Daily  . feeding supplement (PRO-STAT SUGAR FREE 64)  30 mL Per Tube BID  . insulin aspart  0-9 Units Subcutaneous Q4H  . mouth rinse  15 mL Mouth Rinse q12n4p  . pantoprazole sodium  40 mg Per Tube Daily  . polyethylene glycol  17 g Per Tube BID   Continuous Infusions: . sodium chloride    . ceFEPime (MAXIPIME) IV Stopped (05/29/18 2142)  . feeding supplement (OSMOLITE 1.5 CAL) 1,000 mL (05/30/18 0449)  . vancomycin Stopped (05/29/18 1841)     LOS: 2 days   Time spent= 35 mins    Mylen Mangan Joline Maxcyhirag Geran Haithcock, MD [facility]  If 7PM-7AM, please contact night-coverage [email or web address] 05/30/2018, 12:42 PM

## 2018-05-30 NOTE — Progress Notes (Signed)
Initial Nutrition Assessment  DOCUMENTATION CODES:   Not applicable  INTERVENTION:   D/C Osmolite 1.5   Tube feeding:  -Osmolite 1.2 @ 55 ml/hr via PEG (1320 ml) -30 ml Prostat BID  Provides: 1784 kcals, 103 grams protein, 1082 ml free water. Meets 100% of needs.   NUTRITION DIAGNOSIS:   Increased nutrient needs related to wound healing as evidenced by estimated needs.  GOAL:   Patient will meet greater than or equal to 90% of their needs  MONITOR:   Labs, Skin, I & O's, TF tolerance  REASON FOR ASSESSMENT:   New TF    ASSESSMENT:   Patient with PMH significant for CVA (02/2018), s/p trach/PEG, A.fib, and CHF. Presents this admission with sepsis 2/2 to HCAP.    RD working remotely.  Pt currently tolerating Osmolite 1.5 @ 60 ml/hr + 30 ml Prostat BID. This provides 2360 kcal, 120 grams protein, and 1097 ml free water. Pt's home medication lists Osmolite 1.2 without a rate and Prostat BID. Will speak with MD about changing as to Osmolite 1.2 as 1.5 exceeds her needs. Per nurse pt is bed bound at baseline and non verbal.   Per chart records, pt weighed 73.8 kg on 02/19/18 and 74.9 kg this admission.   I/O: +2,060 ml since admit UOP: 1,050 ml x 24 hrs  Drips: NS @ 75 ml/hr  Medications: colace, SS novolog, miralax Labs: Na 134 (L) CBG 134-173   Diet Order:   Diet Order    None      EDUCATION NEEDS:   Not appropriate for education at this time  Skin:  Skin Assessment: Skin Integrity Issues: Skin Integrity Issues:: Stage II Stage II: left arm  Last BM:  5/27  Height:   Ht Readings from Last 1 Encounters:  05/29/18 5\' 3"  (1.6 m)    Weight:   Wt Readings from Last 1 Encounters:  05/30/18 74.9 kg    Ideal Body Weight:  52.3 kg  BMI:  Body mass index is 29.25 kg/m.  Estimated Nutritional Needs:   Kcal:  1600-1800 kcal  Protein:  80-100 grams  Fluid:  >/= 1.5 L/day   Vanessa Kick RD, LDN Clinical Nutrition Pager # - [phone]

## 2018-05-31 ENCOUNTER — Inpatient Hospital Stay (HOSPITAL_COMMUNITY): Payer: Medicare Other

## 2018-05-31 LAB — GLUCOSE, CAPILLARY
Glucose-Capillary: 178 mg/dL — ABNORMAL HIGH (ref 70–99)
Glucose-Capillary: 163 mg/dL — ABNORMAL HIGH (ref 70–99)
Glucose-Capillary: 186 mg/dL — ABNORMAL HIGH (ref 70–99)
Glucose-Capillary: 154 mg/dL — ABNORMAL HIGH (ref 70–99)
Glucose-Capillary: 175 mg/dL — ABNORMAL HIGH (ref 70–99)

## 2018-05-31 LAB — CULTURE, RESPIRATORY W GRAM STAIN: Culture: NORMAL

## 2018-05-31 LAB — CBC
HCT: 29.7 % — ABNORMAL LOW (ref 36.0–46.0)
Hemoglobin: 9.4 g/dL — ABNORMAL LOW (ref 12.0–15.0)
MCH: 28.5 pg (ref 26.0–34.0)
MCHC: 31.6 g/dL (ref 30.0–36.0)
MCV: 90 fL (ref 80.0–100.0)
Platelets: 372 10*3/uL (ref 150–400)
RBC: 3.3 MIL/uL — ABNORMAL LOW (ref 3.87–5.11)
RDW: 17.2 % — ABNORMAL HIGH (ref 11.5–15.5)
WBC: 17.3 10*3/uL — ABNORMAL HIGH (ref 4.0–10.5)
nRBC: 0 % (ref 0.0–0.2)

## 2018-05-31 LAB — BASIC METABOLIC PANEL
Anion gap: 13 (ref 5–15)
BUN: 69 mg/dL — ABNORMAL HIGH (ref 8–23)
CO2: 31 mmol/L (ref 22–32)
Calcium: 8.8 mg/dL — ABNORMAL LOW (ref 8.9–10.3)
Chloride: 95 mmol/L — ABNORMAL LOW (ref 98–111)
Creatinine, Ser: 1.01 mg/dL — ABNORMAL HIGH (ref 0.44–1.00)
GFR calc Af Amer: 58 mL/min — ABNORMAL LOW (ref >60)
GFR calc non Af Amer: 50 mL/min — ABNORMAL LOW (ref >60)
Glucose, Bld: 189 mg/dL — ABNORMAL HIGH (ref 70–99)
Potassium: 3.9 mmol/L (ref 3.5–5.1)
Sodium: 139 mmol/L (ref 135–145)

## 2018-05-31 LAB — MAGNESIUM: Magnesium: 2.6 mg/dL — ABNORMAL HIGH (ref 1.7–2.4)

## 2018-05-31 MED ORDER — TORSEMIDE 20 MG PO TABS
40.0000 mg | ORAL_TABLET | Freq: Every day | ORAL | Status: DC
  Administered 2018-05-31 – 2018-06-02 (×3): 40 mg via ORAL
  Filled 2018-05-31 (×3): qty 2

## 2018-05-31 NOTE — Progress Notes (Signed)
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 fib, diastolic CHF presents the ER with complaints of fevers and shortness of breath.  Patient was found to be febrile and elevated WBC of 23.  SHe was also having higher oxygen requirement with 10 L trach collar.  Suspicion for pneumonia versus bronchial tracheitis therefore initially started on broad-spectrum antibiotic given previous history of Pseudomonas.  Tracheal aspirate was sent.  Pulmonary was consulted.  COVIDnegative.   Assessment & Plan:   Principal Problem:   SIRS (systemic inflammatory response syndrome) (HCC) Active Problems:   Status post tracheostomy (HCC)   Status post insertion of percutaneous endoscopic gastrostomy (PEG) tube (HCC)   Atrial fibrillation, chronic   Chronic diastolic (congestive) heart failure (HCC)   History of completed stroke   Pressure injury of skin  Sepsis secondary to healthcare acquired pneumonia Acute on chronic respiratory failure with hypoxia requiring 10 L oxygen with trach collar - Patient chronically has trach in place.  There is concerns of bronchial tracheitis versus pneumonia.  Persistent leukocytosis therefore continue IV antibiotics e.  Patient has previous history of Pseudomonas.  Tracheal aspirates have been sent, follow-up culture data.  Appreciate pulmonary input #Still having thick secretions therefore requires frequent suctioning. -Incentive spirometry and flutter valve.  Bronchodilators PRN.  Continue frequent suctioning. -Procalcitonin 0.47, BNP 495. -Repeat chest x-ray 5/29-stable left-sided basilar opacity concerning for pneumonia versus atelectasis with associated effusion.   History of hemorrhagic CVA -On aspirin.  History of atrial flutter/fibrillation - Continue home regimen of Cardizem and Coreg.   Not on anticoagulation due to hemorrhagic CVA.  Chronic diastolic congestive heart failure -Resume her home regimen of torsemide  Anemia of chronic disease -Hemoglobin appears to be stable.  Continue to monitor this.  DVT prophylaxis: Lovenox full code Code Status: Full code Family Communication: None Disposition Plan: Still requires significant amount of supplemental oxygen via her trach collar and having thick secretions requiring frequent suctioning.  Maintain in hospital stay for this care.  Consultants:   Pulmonary  Procedures:   None  Antimicrobials:   Cefepime   Subjective: Patient remains nonverbal.  Oxygen levels bumped up to 10 L nasal cannula.  Review of Systems Otherwise negative except as per HPI, including: Unable to obtain given her mentation Objective: Vitals:   05/31/18 0429 05/31/18 0740 05/31/18 0853 05/31/18 1202  BP:  127/61  (!) 139/99  Pulse:  83 88 84  Resp:  (!) 33 (!) 33 (!) 31  Temp:  97.8 F (36.6 C)  99 F (37.2 C)  TempSrc:  Axillary  Oral  SpO2:  97% 99% 97%  Weight: 75.3 kg     Height:        Intake/Output Summary (Last 24 hours) at 05/31/2018 1246 Last data filed at 05/31/2018 0526 Gross per 24 hour  Intake 2482.16 ml  Output 800 ml  Net 1682.16 ml   Filed Weights   05/29/18 0415 05/30/18 0446 05/31/18 0429  Weight: 72 kg 74.9 kg 75.3 kg    Examination:  Constitutional: Positive, chronically ill-appearing Eyes: PERRL, lids and conjunctivae normal ENMT: Mucous membranes are moist. Posterior pharynx clear of any exudate or lesions.Normal dentition.  Neck: normal, supple, no masses, no thyromegaly Respiratory: Diminished breath sounds anteriorly.Janina Mayo in place Cardiovascular: Regular rate and rhythm, no murmurs / rubs / gallops. No  extremity edema. 2+ pedal pulses. No carotid bruits.  Abdomen: no tenderness, no masses palpated. No hepatosplenomegaly. Bowel sounds positive.  PEG tube in place Musculoskeletal: no  clubbing / cyanosis. No joint deformity upper and lower extremities. Good ROM, no contractures. Normal muscle tone.  Skin: no rashes, lesions, ulcers. No induration Neurologic: Difficult to thoroughly assess but grossly moves all extremities Psychiatric: Difficult to assess  Data Reviewed:   CBC: Recent Labs  Lab 05/29/18 0207 05/30/18 0301 05/31/18 0436  WBC 23.8* 16.2* 17.3*  NEUTROABS 20.1*  --   --   HGB 10.5* 9.6* 9.4*  HCT 31.7* 28.8* 29.7*  MCV 88.3 87.8 90.0  PLT 363 343 372   Basic Metabolic Panel: Recent Labs  Lab 05/29/18 0207 05/30/18 0301 05/31/18 0436  NA 131* 134* 139  K 3.3* 4.0 3.9  CL 80* 87* 95*  CO2 33* 31 31  GLUCOSE 98 116* 189*  BUN 61* 75* 69*  CREATININE 0.98 1.00 1.01*  CALCIUM 9.0 8.4* 8.8*  MG  --  2.4 2.6*   GFR: Estimated Creatinine Clearance: 38.9 mL/min (A) (by C-G formula based on SCr of 1.01 mg/dL (H)). Liver Function Tests: Recent Labs  Lab 05/29/18 0207  AST 72*  ALT 62*  ALKPHOS 138*  BILITOT 0.8  PROT 7.5  ALBUMIN 2.4*   No results for input(s): LIPASE, AMYLASE in the last 168 hours. No results for input(s): AMMONIA in the last 168 hours. Coagulation Profile: No results for input(s): INR, PROTIME in the last 168 hours. Cardiac Enzymes: Recent Labs  Lab 05/29/18 0207 05/29/18 0518 05/29/18 1058 05/29/18 1559  TROPONINI 0.36* 0.39* 0.33* 0.28*   BNP (last 3 results) No results for input(s): PROBNP in the last 8760 hours. HbA1C: No results for input(s): HGBA1C in the last 72 hours. CBG: Recent Labs  Lab 05/30/18 1955 05/30/18 2227 05/31/18 0439 05/31/18 0739 05/31/18 1159  GLUCAP 177* 203* 178* 163* 186*   Lipid Profile: No results for input(s): CHOL, HDL, LDLCALC, TRIG, CHOLHDL, LDLDIRECT in the last 72 hours. Thyroid Function Tests: No results for input(s): TSH, T4TOTAL, FREET4, T3FREE, THYROIDAB in the last 72 hours. Anemia Panel: No results for input(s): VITAMINB12, FOLATE, FERRITIN, TIBC, IRON,  RETICCTPCT in the last 72 hours. Sepsis Labs: Recent Labs  Lab 05/29/18 0126 05/29/18 0401 05/29/18 0745 05/30/18 0301  PROCALCITON  --   --  0.42 0.47  LATICACIDVEN 1.6 1.3  --   --     Recent Results (from the past 240 hour(s))  SARS Coronavirus 2 (CEPHEID - Performed in [facility] hospital lab), Hosp Order     Status: None   Collection Time: 05/29/18  4:10 AM  Result Value Ref Range Status   SARS Coronavirus 2 NEGATIVE NEGATIVE Final    Comment: (NOTE) If result is NEGATIVE SARS-CoV-2 target nucleic acids are NOT DETECTED. The SARS-CoV-2 RNA is generally detectable in upper and lower  respiratory specimens during the acute phase of infection. The lowest  concentration of SARS-CoV-2 viral copies this assay can detect is 250  copies / mL. A negative result does not preclude SARS-CoV-2 infection  and should not be used as the sole basis for treatment or other  patient management decisions.  A negative result may occur with  improper specimen collection / handling, submission of specimen other  than nasopharyngeal swab, presence of viral mutation(s) within the  areas targeted by this assay, and inadequate number of viral copies  (<250 copies / mL). A negative result must be combined with  clinical  observations, patient history, and epidemiological information. If result is POSITIVE SARS-CoV-2 target nucleic acids are DETECTED. The SARS-CoV-2 RNA is generally detectable in upper and lower  respiratory specimens dur ing the acute phase of infection.  Positive  results are indicative of active infection with SARS-CoV-2.  Clinical  correlation with patient history and other diagnostic information is  necessary to determine patient infection status.  Positive results do  not rule out bacterial infection or co-infection with other viruses. If result is PRESUMPTIVE POSTIVE SARS-CoV-2 nucleic acids MAY BE PRESENT.   A presumptive positive result was obtained on the submitted  specimen  and confirmed on repeat testing.  While 2019 novel coronavirus  (SARS-CoV-2) nucleic acids may be present in the submitted sample  additional confirmatory testing may be necessary for epidemiological  and / or clinical management purposes  to differentiate between  SARS-CoV-2 and other Sarbecovirus currently known to infect humans.  If clinically indicated additional testing with an alternate test  methodology [phone]) is advised. The SARS-CoV-2 RNA is generally  detectable in upper and lower respiratory sp ecimens during the acute  phase of infection. The expected result is Negative. Fact Sheet for Patients:  BoilerBrush.com.cy Fact Sheet for Healthcare Providers: https://pope.com/ This test is not yet approved or cleared by the [place] FDA and has been authorized for detection and/or diagnosis of SARS-CoV-2 by FDA under an Emergency Use Authorization (EUA).  This EUA will remain in effect (meaning this test can be used) for the duration of the COVID-19 declaration under Section 564(b)(1) of the Act, 21 U.S.C. section 360bbb-3(b)(1), unless the authorization is terminated or revoked sooner. Performed at [facility], [address], [place], [place] [address]   MRSA PCR Screening     Status: None   Collection Time: 05/29/18 11:38 AM  Result Value Ref Range Status   MRSA by PCR NEGATIVE NEGATIVE Final    Comment:        The GeneXpert MRSA Assay (FDA approved for NASAL specimens only), is one component of a comprehensive MRSA colonization surveillance program. It is not intended to diagnose MRSA infection nor to guide or monitor treatment for MRSA infections. Performed at [facility], [address], [place], [place] [address]   Culture, respiratory (non-expectorated)     Status: None   Collection Time: 05/29/18  3:40 PM  Result Value Ref Range Status   Specimen Description TRACHEAL ASPIRATE  Final    Special Requests NONE  Final   Gram Stain   Final    ABUNDANT WBC PRESENT, PREDOMINANTLY PMN NO ORGANISMS SEEN    Culture   Final    RARE Consistent with normal respiratory flora. Performed at [facility], [address] [place], [place] [address]    Report Status 05/31/2018 FINAL  Final         Radiology Studies: Dg Chest Port 1 View  Result Date: 05/31/2018 CLINICAL DATA:  Dyspnea. EXAM: PORTABLE CHEST 1 VIEW COMPARISON:  Radiograph May 29, 2018. FINDINGS: Stable cardiomegaly. Atherosclerosis of thoracic aorta is noted. Tracheostomy tube is unchanged in position. No pneumothorax is noted. Right lung is clear. Stable left midlung and basilar opacity is noted. Some degree of pleural effusion cannot be excluded. Bony thorax is unremarkable. IMPRESSION: Stable left basilar opacity is noted concerning for pneumonia or atelectasis with associated pleural effusion. Stable left midlung atelectasis is noted. Aortic Atherosclerosis (ICD10-I70.0). Electronically Signed   By: Lupita Raider M.D.   On: 05/31/2018  10:02        Scheduled Meds:  aspirin  81 mg Per Tube Daily   carvedilol  3.125 mg Per Tube BID WC   chlorhexidine  15 mL Mouth Rinse BID   diltiazem  30 mg Per Tube Q6H   docusate  100 mg Per Tube BID   enoxaparin (LOVENOX) injection  40 mg Subcutaneous Daily   feeding supplement (PRO-STAT SUGAR FREE 64)  30 mL Per Tube BID   insulin aspart  0-9 Units Subcutaneous Q4H   mouth rinse  15 mL Mouth Rinse q12n4p   pantoprazole sodium  40 mg Per Tube Daily   polyethylene glycol  17 g Per Tube BID   Continuous Infusions:  ceFEPime (MAXIPIME) IV 2 g (05/31/18 1006)   feeding supplement (OSMOLITE 1.2 CAL) 1,000 mL (05/30/18 2225)   vancomycin Stopped (05/29/18 1841)     LOS: 3 days   Time spent= 35 mins    Seirra Kos Joline Maxcy, MD [facility]  If 7PM-7AM, please contact night-coverage [email or web address] 05/31/2018, 12:46 PM

## 2018-06-01 ENCOUNTER — Inpatient Hospital Stay (HOSPITAL_COMMUNITY): Payer: Medicare Other

## 2018-06-01 LAB — GLUCOSE, CAPILLARY
Glucose-Capillary: 123 mg/dL — ABNORMAL HIGH (ref 70–99)
Glucose-Capillary: 146 mg/dL — ABNORMAL HIGH (ref 70–99)
Glucose-Capillary: 151 mg/dL — ABNORMAL HIGH (ref 70–99)
Glucose-Capillary: 169 mg/dL — ABNORMAL HIGH (ref 70–99)
Glucose-Capillary: 174 mg/dL — ABNORMAL HIGH (ref 70–99)
Glucose-Capillary: 174 mg/dL — ABNORMAL HIGH (ref 70–99)
Glucose-Capillary: 180 mg/dL — ABNORMAL HIGH (ref 70–99)

## 2018-06-01 LAB — BASIC METABOLIC PANEL
Anion gap: 17 — ABNORMAL HIGH (ref 5–15)
BUN: 81 mg/dL — ABNORMAL HIGH (ref 8–23)
CO2: 28 mmol/L (ref 22–32)
Calcium: 8.8 mg/dL — ABNORMAL LOW (ref 8.9–10.3)
Chloride: 98 mmol/L (ref 98–111)
Creatinine, Ser: 1.07 mg/dL — ABNORMAL HIGH (ref 0.44–1.00)
GFR calc Af Amer: 54 mL/min — ABNORMAL LOW (ref >60)
GFR calc non Af Amer: 47 mL/min — ABNORMAL LOW (ref >60)
Glucose, Bld: 170 mg/dL — ABNORMAL HIGH (ref 70–99)
Potassium: 4.2 mmol/L (ref 3.5–5.1)
Sodium: 143 mmol/L (ref 135–145)

## 2018-06-01 LAB — CBC
HCT: 29.7 % — ABNORMAL LOW (ref 36.0–46.0)
Hemoglobin: 9.3 g/dL — ABNORMAL LOW (ref 12.0–15.0)
MCH: 29 pg (ref 26.0–34.0)
MCHC: 31.3 g/dL (ref 30.0–36.0)
MCV: 92.5 fL (ref 80.0–100.0)
Platelets: 369 10*3/uL (ref 150–400)
RBC: 3.21 MIL/uL — ABNORMAL LOW (ref 3.87–5.11)
RDW: 17.2 % — ABNORMAL HIGH (ref 11.5–15.5)
WBC: 16.8 10*3/uL — ABNORMAL HIGH (ref 4.0–10.5)
nRBC: 0 % (ref 0.0–0.2)

## 2018-06-01 LAB — MAGNESIUM: Magnesium: 2.6 mg/dL — ABNORMAL HIGH (ref 1.7–2.4)

## 2018-06-01 LAB — PROCALCITONIN: Procalcitonin: 0.51 ng/mL

## 2018-06-01 LAB — BRAIN NATRIURETIC PEPTIDE: B Natriuretic Peptide: 1203 pg/mL — ABNORMAL HIGH (ref 0.0–100.0)

## 2018-06-01 MED ORDER — FUROSEMIDE 10 MG/ML IJ SOLN
INTRAMUSCULAR | Status: AC
  Filled 2018-06-01: qty 4

## 2018-06-01 MED ORDER — IPRATROPIUM-ALBUTEROL 0.5-2.5 (3) MG/3ML IN SOLN
3.0000 mL | Freq: Four times a day (QID) | RESPIRATORY_TRACT | Status: DC
  Administered 2018-06-01 – 2018-06-02 (×6): 3 mL via RESPIRATORY_TRACT
  Filled 2018-06-01 (×6): qty 3

## 2018-06-01 MED ORDER — FUROSEMIDE 10 MG/ML IJ SOLN
40.0000 mg | Freq: Two times a day (BID) | INTRAMUSCULAR | Status: AC
  Administered 2018-06-01 (×2): 40 mg via INTRAVENOUS
  Filled 2018-06-01: qty 4

## 2018-06-01 MED ORDER — METHYLPREDNISOLONE SODIUM SUCC 40 MG IJ SOLR
40.0000 mg | Freq: Three times a day (TID) | INTRAMUSCULAR | Status: DC
  Administered 2018-06-01 – 2018-06-03 (×6): 40 mg via INTRAVENOUS
  Filled 2018-06-01 (×6): qty 1

## 2018-06-01 MED ORDER — IPRATROPIUM-ALBUTEROL 0.5-2.5 (3) MG/3ML IN SOLN
3.0000 mL | RESPIRATORY_TRACT | Status: DC | PRN
  Administered 2018-06-03: 05:00:00 3 mL via RESPIRATORY_TRACT
  Filled 2018-06-01: qty 3

## 2018-06-01 MED ORDER — BUDESONIDE 0.5 MG/2ML IN SUSP
0.5000 mg | Freq: Two times a day (BID) | RESPIRATORY_TRACT | Status: DC
  Administered 2018-06-01 – 2018-06-11 (×20): 0.5 mg via RESPIRATORY_TRACT
  Filled 2018-06-01 (×20): qty 2

## 2018-06-01 NOTE — Progress Notes (Addendum)
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 fib, diastolic CHF presents the ER with complaints of fevers and shortness of breath.  Patient was found to be febrile and elevated WBC of 23.  SHe was also having higher oxygen requirement with 10 L trach collar.  Suspicion for pneumonia versus bronchial tracheitis therefore initially started on broad-spectrum antibiotic given previous history of Pseudomonas.  Tracheal aspirate was sent.  Pulmonary was consulted.  COVIDnegative.   Assessment & Plan:   Principal Problem:   SIRS (systemic inflammatory response syndrome) (HCC) Active Problems:   Status post tracheostomy (HCC)   Status post insertion of percutaneous endoscopic gastrostomy (PEG) tube (HCC)   Atrial fibrillation, chronic   Chronic diastolic (congestive) heart failure (HCC)   History of completed stroke   Pressure injury of skin  Sepsis secondary to healthcare acquired pneumonia Acute on chronic respiratory failure with hypoxia requiring 10 L oxygen with trach collar -Chronically on Trach Collar. Now requiring supp Oxygen. Cont IV Abx and freq suctioning. Tracheal aspirate- NGTD Still having thick secretions therefore requires frequent suctioning. -Incentive spirometry and flutter valve.  Bronchodilators scheduled and PRN.  Continue frequent suctioning. Repeat CXR today, lasix  IV once.  -Procalcitonin 0.47, BNP 495. -Repeat chest x-ray 5/29-stable left-sided basilar opacity concerning for pneumonia versus atelectasis with associated effusion.   Consult Palliative care team to help establish goals of care. Currently family is inclined to bring her with Ventilatory for short periods if needed and consider Hospice care. They very much wish to spend sometime with the patient and be with her before  completely giving up on her.  Interperter line used [id number]  Also added Solumedrol.CXR showed vasc congestion therefore ordered lasix IV.   History of hemorrhagic CVA -On aspirin.  History of atrial flutter/fibrillation - Continue home regimen of Cardizem and Coreg.  Not on anticoagulation due to hemorrhagic CVA.  Chronic diastolic congestive heart failure -Resume her home regimen of torsemide  Anemia of chronic disease -Hemoglobin appears to be stable.  Continue to monitor this.  DVT prophylaxis: Lovenox full code Code Status: Full code for now.  Family Communication: Spoke with daughter, naby using interpreter service # [phone] Disposition Plan: [facility] stay for resp support. Need her Resp status to improve before she can be discharged.   Consultants:   Pulmonary  Procedures:   None  Antimicrobials:   Cefepime   Subjective: Patient is having resp distress this morning.   Spoke with the daughter at length using interpreter service to clarify goal of care and Code status. She wished her mother to be full code for now. Also discussed Hospice care with her, She is considering transitioning patient home with home hospice.   Review of Systems Otherwise negative except as per HPI, including: Unable to obtain.   Objective: Vitals:   06/01/18 0822 06/01/18 1212 06/01/18 1225 06/01/18 1503  BP: 135/90 136/79 136/79 136/79  Pulse: 82 86 86 87  Resp: (!) 32 (!) 38 (!) 32 (!) 38  Temp:  97.8 F (36.6 C)    TempSrc:  Axillary    SpO2:  96% 95% 95%  Weight:      Height:        Intake/Output Summary (Last 24 hours) at 06/01/2018 1522 Last data filed at 06/01/2018 1100 Gross per 24 hour  Intake -  Output 1200 ml  Net -1200 ml   Filed Weights   05/30/18 0446 05/31/18 0429 06/01/18 0338  Weight: 74.9 kg 75.3 kg 76 kg    Examination:  Constitutional: Chronically Ill, On 5L  Eyes: PERRL, lids and conjunctivae normal ENMT: Mucous membranes are DRY  Posterior pharynx clear of any exudate or lesions.Normal dentition.  Neck: normal, supple, no masses, no thyromegaly Respiratory: B/l Diminished BS, Trach in place with thick secretions.  Cardiovascular: Regular rate and rhythm, no murmurs / rubs / gallops. 1+ b/l UE LE extremity edema. 2+ pedal pulses. No carotid bruits.  Abdomen: no tenderness, no masses palpated. No hepatosplenomegaly. Bowel sounds positive. + Peg in place.  Musculoskeletal: No gross contractures.  Skin: no rashes, lesions, ulcers. No induration Neurologic: Unable to fully assess.  Psychiatric: Unable to assess.    Data Reviewed:   CBC: Recent Labs  Lab 05/29/18 0207 05/30/18 0301 05/31/18 0436 06/01/18 0348  WBC 23.8* 16.2* 17.3* 16.8*  NEUTROABS 20.1*  --   --   --   HGB 10.5* 9.6* 9.4* 9.3*  HCT 31.7* 28.8* 29.7* 29.7*  MCV 88.3 87.8 90.0 92.5  PLT 363 343 372 369   Basic Metabolic Panel: Recent Labs  Lab 05/29/18 0207 05/30/18 0301 05/31/18 0436 06/01/18 0348  NA 131* 134* 139 143  K 3.3* 4.0 3.9 4.2  CL 80* 87* 95* 98  CO2 33* 31 31 28   GLUCOSE 98 116* 189* 170*  BUN 61* 75* 69* 81*  CREATININE 0.98 1.00 1.01* 1.07*  CALCIUM 9.0 8.4* 8.8* 8.8*  MG  --  2.4 2.6* 2.6*   GFR: Estimated Creatinine Clearance: 36.8 mL/min (A) (by C-G formula based on SCr of 1.07 mg/dL (H)). Liver Function Tests: Recent Labs  Lab 05/29/18 0207  AST 72*  ALT 62*  ALKPHOS 138*  BILITOT 0.8  PROT 7.5  ALBUMIN 2.4*   No results for input(s): LIPASE, AMYLASE in the last 168 hours. No results for input(s): AMMONIA in the last 168 hours. Coagulation Profile: No results for input(s): INR, PROTIME in the last 168 hours. Cardiac Enzymes: Recent Labs  Lab 05/29/18 0207 05/29/18 0518 05/29/18 1058 05/29/18 1559  TROPONINI 0.36* 0.39* 0.33* 0.28*   BNP (last 3 results) No results for input(s): PROBNP in the last 8760 hours. HbA1C: No results for input(s): HGBA1C in the last 72 hours. CBG: Recent Labs   Lab 05/31/18 2317 06/01/18 0040 06/01/18 0337 06/01/18 0804 06/01/18 1211  GLUCAP 175* 123* 146* 169* 151*   Lipid Profile: No results for input(s): CHOL, HDL, LDLCALC, TRIG, CHOLHDL, LDLDIRECT in the last 72 hours. Thyroid Function Tests: No results for input(s): TSH, T4TOTAL, FREET4, T3FREE, THYROIDAB in the last 72 hours. Anemia Panel: No results for input(s): VITAMINB12, FOLATE, FERRITIN, TIBC, IRON, RETICCTPCT in the last 72 hours. Sepsis Labs: Recent Labs  Lab 05/29/18 0126 05/29/18 0401 05/29/18 0745 05/30/18 0301 06/01/18 0842  PROCALCITON  --   --  0.42 0.47 0.51  LATICACIDVEN 1.6 1.3  --   --   --     Recent Results (from the past 240 hour(s))  SARS Coronavirus 2 (CEPHEID - Performed in [facility] hospital lab), Hosp Order     Status: None   Collection Time: 05/29/18  4:10 AM  Result Value Ref Range Status   SARS Coronavirus 2 NEGATIVE NEGATIVE Final    Comment: (NOTE) If result is NEGATIVE SARS-CoV-2 target nucleic acids are NOT DETECTED. The SARS-CoV-2 RNA is generally detectable in upper and lower  respiratory  specimens during the acute phase of infection. The lowest  concentration of SARS-CoV-2 viral copies this assay can detect is 250  copies / mL. A negative result does not preclude SARS-CoV-2 infection  and should not be used as the sole basis for treatment or other  patient management decisions.  A negative result may occur with  improper specimen collection / handling, submission of specimen other  than nasopharyngeal swab, presence of viral mutation(s) within the  areas targeted by this assay, and inadequate number of viral copies  (<250 copies / mL). A negative result must be combined with clinical  observations, patient history, and epidemiological information. If result is POSITIVE SARS-CoV-2 target nucleic acids are DETECTED. The SARS-CoV-2 RNA is generally detectable in upper and lower  respiratory specimens dur ing the acute phase of  infection.  Positive  results are indicative of active infection with SARS-CoV-2.  Clinical  correlation with patient history and other diagnostic information is  necessary to determine patient infection status.  Positive results do  not rule out bacterial infection or co-infection with other viruses. If result is PRESUMPTIVE POSTIVE SARS-CoV-2 nucleic acids MAY BE PRESENT.   A presumptive positive result was obtained on the submitted specimen  and confirmed on repeat testing.  While 2019 novel coronavirus  (SARS-CoV-2) nucleic acids may be present in the submitted sample  additional confirmatory testing may be necessary for epidemiological  and / or clinical management purposes  to differentiate between  SARS-CoV-2 and other Sarbecovirus currently known to infect humans.  If clinically indicated additional testing with an alternate test  methodology [phone]) is advised. The SARS-CoV-2 RNA is generally  detectable in upper and lower respiratory sp ecimens during the acute  phase of infection. The expected result is Negative. Fact Sheet for Patients:  BoilerBrush.com.cy Fact Sheet for Healthcare Providers: https://pope.com/ This test is not yet approved or cleared by the [place] FDA and has been authorized for detection and/or diagnosis of SARS-CoV-2 by FDA under an Emergency Use Authorization (EUA).  This EUA will remain in effect (meaning this test can be used) for the duration of the COVID-19 declaration under Section 564(b)(1) of the Act, 21 U.S.C. section 360bbb-3(b)(1), unless the authorization is terminated or revoked sooner. Performed at [facility], [address], [place], [place] [address]   MRSA PCR Screening     Status: None   Collection Time: 05/29/18 11:38 AM  Result Value Ref Range Status   MRSA by PCR NEGATIVE NEGATIVE Final    Comment:        The GeneXpert MRSA Assay (FDA approved for NASAL specimens  only), is one component of a comprehensive MRSA colonization surveillance program. It is not intended to diagnose MRSA infection nor to guide or monitor treatment for MRSA infections. Performed at [facility], [address] [place], [place] [address]   Culture, respiratory (non-expectorated)     Status: None   Collection Time: 05/29/18  3:40 PM  Result Value Ref Range Status   Specimen Description TRACHEAL ASPIRATE  Final   Special Requests NONE  Final   Gram Stain   Final    ABUNDANT WBC PRESENT, PREDOMINANTLY PMN NO ORGANISMS SEEN    Culture   Final    RARE Consistent with normal respiratory flora. Performed at [facility], [address], [place], [place] [address]    Report Status 05/31/2018 FINAL  Final         Radiology Studies: Dg Chest Port 1  View  Result Date: 05/31/2018 CLINICAL DATA:  Dyspnea. EXAM: PORTABLE CHEST 1 VIEW COMPARISON:  Radiograph May 29, 2018. FINDINGS: Stable cardiomegaly. Atherosclerosis of thoracic aorta is noted. Tracheostomy tube is unchanged in position. No pneumothorax is noted. Right lung is clear. Stable left midlung and basilar opacity is noted. Some degree of pleural effusion cannot be excluded. Bony thorax is unremarkable. IMPRESSION: Stable left basilar opacity is noted concerning for pneumonia or atelectasis with associated pleural effusion. Stable left midlung atelectasis is noted. Aortic Atherosclerosis (ICD10-I70.0). Electronically Signed   By: Lupita RaiderJames  Green Jr M.D.   On: 05/31/2018 10:02        Scheduled Meds: . aspirin  81 mg Per Tube Daily  . carvedilol  3.125 mg Per Tube BID WC  . chlorhexidine  15 mL Mouth Rinse BID  . diltiazem  30 mg Per Tube Q6H  . docusate  100 mg Per Tube BID  . enoxaparin (LOVENOX) injection  40 mg Subcutaneous Daily  . feeding supplement (PRO-STAT SUGAR FREE 64)  30 mL Per Tube BID  . furosemide  40 mg Intravenous BID  . insulin aspart  0-9 Units Subcutaneous Q4H  . mouth rinse  15  mL Mouth Rinse q12n4p  . pantoprazole sodium  40 mg Per Tube Daily  . polyethylene glycol  17 g Per Tube BID  . torsemide  40 mg Oral Daily   Continuous Infusions: . ceFEPime (MAXIPIME) IV 2 g (06/01/18 1036)  . feeding supplement (OSMOLITE 1.2 CAL) 1,000 mL (05/30/18 2225)     LOS: 4 days   Time spent= 35 mins    Kitty Cadavid Joline Maxcyhirag Isabel Ardila, MD [facility]  If 7PM-7AM, please contact night-coverage [email or web address] 06/01/2018, 3:22 PM

## 2018-06-01 NOTE — Progress Notes (Signed)
1500 Pt having increased WOB & increased RR.  MD called and made aware. No new orders at this time. RT will continue to monitor

## 2018-06-02 LAB — CBC
HCT: 28.5 % — ABNORMAL LOW (ref 36.0–46.0)
Hemoglobin: 8.9 g/dL — ABNORMAL LOW (ref 12.0–15.0)
MCH: 28.2 pg (ref 26.0–34.0)
MCHC: 31.2 g/dL (ref 30.0–36.0)
MCV: 90.2 fL (ref 80.0–100.0)
Platelets: 394 10*3/uL (ref 150–400)
RBC: 3.16 MIL/uL — ABNORMAL LOW (ref 3.87–5.11)
RDW: 17.5 % — ABNORMAL HIGH (ref 11.5–15.5)
WBC: 12.5 10*3/uL — ABNORMAL HIGH (ref 4.0–10.5)
nRBC: 0 % (ref 0.0–0.2)

## 2018-06-02 LAB — GLUCOSE, CAPILLARY
Glucose-Capillary: 185 mg/dL — ABNORMAL HIGH (ref 70–99)
Glucose-Capillary: 203 mg/dL — ABNORMAL HIGH (ref 70–99)
Glucose-Capillary: 216 mg/dL — ABNORMAL HIGH (ref 70–99)
Glucose-Capillary: 222 mg/dL — ABNORMAL HIGH (ref 70–99)
Glucose-Capillary: 230 mg/dL — ABNORMAL HIGH (ref 70–99)
Glucose-Capillary: 246 mg/dL — ABNORMAL HIGH (ref 70–99)

## 2018-06-02 LAB — BASIC METABOLIC PANEL
Anion gap: 16 — ABNORMAL HIGH (ref 5–15)
BUN: 100 mg/dL — ABNORMAL HIGH (ref 8–23)
CO2: 33 mmol/L — ABNORMAL HIGH (ref 22–32)
Calcium: 8.9 mg/dL (ref 8.9–10.3)
Chloride: 94 mmol/L — ABNORMAL LOW (ref 98–111)
Creatinine, Ser: 1.26 mg/dL — ABNORMAL HIGH (ref 0.44–1.00)
GFR calc Af Amer: 45 mL/min — ABNORMAL LOW (ref >60)
GFR calc non Af Amer: 39 mL/min — ABNORMAL LOW (ref >60)
Glucose, Bld: 214 mg/dL — ABNORMAL HIGH (ref 70–99)
Potassium: 3 mmol/L — ABNORMAL LOW (ref 3.5–5.1)
Sodium: 143 mmol/L (ref 135–145)

## 2018-06-02 LAB — MAGNESIUM: Magnesium: 2.6 mg/dL — ABNORMAL HIGH (ref 1.7–2.4)

## 2018-06-02 LAB — BRAIN NATRIURETIC PEPTIDE: B Natriuretic Peptide: 941.4 pg/mL — ABNORMAL HIGH (ref 0.0–100.0)

## 2018-06-02 MED ORDER — POTASSIUM CHLORIDE 10 MEQ/100ML IV SOLN
10.0000 meq | INTRAVENOUS | Status: AC
  Administered 2018-06-02 (×5): 10 meq via INTRAVENOUS
  Filled 2018-06-02 (×5): qty 100

## 2018-06-02 MED ORDER — GLYCOPYRROLATE 1 MG PO TABS
1.0000 mg | ORAL_TABLET | Freq: Two times a day (BID) | ORAL | Status: DC
  Administered 2018-06-02 – 2018-06-11 (×19): 1 mg
  Filled 2018-06-02 (×20): qty 1

## 2018-06-02 NOTE — Progress Notes (Signed)
Pharmacy Antibiotic Note  Kelli Vazquez is a 85 y.o. female with h/o tracheostomy admitted from [facility] on 05/28/2018 with fevers/SOB/HCAP.  Pharmacy has been consulted for Vancomycin and Cefepime dosing.  Vancomycin d/c'd 5/29.  Scr slightly up today.  Plan: Cefepime 2 g IV q12h - f/u LOT  Height: 5\' 3"  (160 cm) Weight: 164 lb 3.9 oz (74.5 kg) IBW/kg (Calculated) : 52.4  Temp (24hrs), Avg:98.5 F (36.9 C), Min:97.7 F (36.5 C), Max:99 F (37.2 C)   Recent Labs  Lab 05/29/18 0126 05/29/18 0207 05/29/18 0401 05/30/18 0301 05/31/18 0436 06/01/18 0348 06/02/18 0441  WBC  --  23.8*  --  16.2* 17.3* 16.8* 12.5*  CREATININE  --  0.98  --  1.00 1.01* 1.07* 1.26*  LATICACIDVEN 1.6  --  1.3  --   --   --   --     Estimated Creatinine Clearance: 31 mL/min (A) (by C-G formula based on SCr of 1.26 mg/dL (H)).    Allergies  Allergen Reactions  . Latex Rash  . Penicillins Rash    Did it involve swelling of the face/tongue/throat, SOB, or low BP? No Did it involve sudden or severe rash/hives, skin peeling, or any reaction on the inside of your mouth or nose? No Did you need to seek medical attention at a hospital or doctor's office? No When did it last happen? If all above answers are "NO", may proceed with cephalosporin use.    Vancomycin 5/27>5/29  Cefepime 5/27>   Covid negative MRSA PCR negative  5/27 TA > Normal flora  Reece Leader, Colon Flattery, [facility] Clinical Pharmacist Phone [phone]  06/02/2018 1:56 PM

## 2018-06-02 NOTE — Progress Notes (Signed)
Palliative:  Chart reviewed - patient well known to palliative team from previous hospitalization with CVA. Received report from RN. Attempted to call patient's daughter, Marylu Lund, at home and cell numbers listed with no answer at either. Left voice mails with call back number. Will attempt to call daughter again tomorrow.  Thank you for this consult.  Gerlean Ren, DNP, AGNP-C Palliative Medicine Team Team Phone # [phone]  Pager # [phone]  NO CHARGE

## 2018-06-02 NOTE — Progress Notes (Signed)
Attempted to call pt's daughter [phone] but could not be reached. Left a message. Victorino December, RN

## 2018-06-02 NOTE — Consult Note (Addendum)
Consultation Note Date: 06/02/2018   Patient Name: Kelli Vazquez  DOB: 02-20-1929  MRN: [id number]  Age / Sex: 85 y.o., female  PCP: Kipp Brood, MD Referring Physician: Damita Lack, MD  Reason for Consultation: Establishing goals of care  HPI/Patient Profile: 85 y.o. 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 Patient was living at a SNF. She has been requiring higher oxygen - 10L trach collar.  She is being treated for sepsis secondary to HCAP. She is requiring frequent suctioning. PMT consulted for [place].  Clinical Assessment and Goals of Care: I have reviewed medical records including EPIC notes, labs and imaging, received report from RN, and then met spoke with patient's daughter, Kelli Vazquez,  to discuss diagnosis prognosis, [place], EOL wishes, disposition and options.  Patient is well known to palliative team - we met with family multiple times during previous hospitalization when patient had stroke.   I introduced Palliative Medicine as specialized medical care for people living with serious illness. It focuses on providing relief from the symptoms and stress of a serious illness. The goal is to improve quality of life for both the patient and the family.  I attempted to assess how things have been going for Kelli Vazquez since she was discharged in Feb - Kelli Vazquez shares she has been at the facility and d/t the pandemic she has been unable to spend much time with her. She tells me " every time I call the nurse, she tells me she is fine".    We discussed her current illness and what it means in the larger context of her on-going co-morbidities.  Natural disease trajectory and expectations at EOL were discussed. We discussed her stroke and limitations as a result of that.   She tells me "someone mentioned hospice and we do NOT want that,  that is for people that are dying and my mother is not". I attempted to describe hospice services to [facility]. She was clear they are not interested in hospice.   She was tearful throughout our conversation telling me it has been so difficult being unable to see her mother d/t visitor restrictions during pandemic. I provided her with the number for the nurses station so she could arrange telephone calls with patient when she is more alert.  She tells me she is hopeful to bring her mother home - but not with hospice support. We discussed difficulties of this d/t her mother's total dependence on other's for her care. She tells me she is okay with her returning to facility if that is what is best.   We discussed her code status - I recommended DNR d/t Kelli Vazquez's advanced illness and debility. Kelli Vazquez tells me she wants her mother to remains full code. She tells me prolonging her life is most important to her and to "do whatever you have to".  Questions and concerns were addressed. The family was encouraged to call with questions or concerns.   I attempted to call other daughter, Kelli Vazquez, however she did not answer and has not returned calls.   Update: was able to speak with daughter Kelli Vazquez - she confirms above conversation: wants mother to go back to SNF, no interest in hospice, Full code  Primary Decision Maker NEXT OF KIN - daughters Kelli Vazquez and Naby   SUMMARY OF RECOMMENDATIONS   - full code/fulll scope stating prolonging life is ultimate goal -  would prefer to bring her home with home health but understands she would need total care and agreeable to facility placement if that is best - not interested in hospice support  Code Status/Advance Care Planning:  Full code  Palliative Prophylaxis:   Aspiration, Delirium Protocol, Frequent Pain Assessment, Oral Care and Turn Reposition  Additional Recommendations (Limitations, Scope, Preferences):  Full Scope Treatment  Prognosis:   Unable to  determine  Discharge Planning: [place] for rehab with Palliative care service follow-up      Primary Diagnoses: Present on Admission: . SIRS (systemic inflammatory response syndrome) (HCC) . Atrial fibrillation, chronic . Chronic diastolic (congestive) heart failure (HCC)   I have reviewed the medical record, interviewed the patient and family, and examined the patient. The following aspects are pertinent.  Past Medical History:  Diagnosis Date  . CHF (congestive heart failure) ([place])   . Dementia ([place])   . Dysrhythmia   . Stroke [facility])    Social History   Socioeconomic History  . Marital status: Widowed    Spouse name: Not on file  . Number of children: Not on file  . Years of education: Not on file  . Highest education level: Not on file  Occupational History  . Not on file  Social Needs  . Financial resource strain: Not on file  . Food insecurity:    Worry: Not on file    Inability: Not on file  . Transportation needs:    Medical: Not on file    Non-medical: Not on file  Tobacco Use  . Smoking status: Unknown If Ever Smoked  . Smokeless tobacco: Never Used  Substance and Sexual Activity  . Alcohol use: Not on file  . Drug use: Not on file  . Sexual activity: Not on file  Lifestyle  . Physical activity:    Days per week: Not on file    Minutes per session: Not on file  . Stress: Not on file  Relationships  . Social connections:    Talks on phone: Not on file    Gets together: Not on file    Attends religious service: Not on file    Active member of club or organization: Not on file    Attends meetings of clubs or organizations: Not on file    Relationship status: Not on file  Other Topics Concern  . Not on file  Social History Narrative  . Not on file   Family History  Family history unknown: Yes   Scheduled Meds: . aspirin  81 mg Per Tube Daily  . budesonide (PULMICORT) nebulizer solution  0.5 mg Nebulization BID  . carvedilol   3.125 mg Per Tube BID WC  . chlorhexidine  15 mL Mouth Rinse BID  . diltiazem  30 mg Per Tube Q6H  . docusate  100 mg Per Tube BID  . enoxaparin (LOVENOX) injection  40 mg Subcutaneous Daily  . feeding supplement (PRO-STAT SUGAR FREE 64)  30 mL Per Tube BID  . glycopyrrolate  1 mg Per Tube BID  . insulin aspart  0-9 Units Subcutaneous Q4H  . ipratropium-albuterol  3 mL Nebulization Q6H  . mouth rinse  15 mL Mouth Rinse q12n4p  . methylPREDNISolone (SOLU-MEDROL) injection  40 mg Intravenous Q8H  . pantoprazole sodium  40 mg Per Tube Daily  . polyethylene glycol  17 g Per Tube BID   Continuous Infusions: . ceFEPime (MAXIPIME) IV 2 g (06/02/18 0935)  . feeding supplement (OSMOLITE 1.2 CAL) 1,000  mL (06/01/18 1750)  . potassium chloride 10 mEq (06/02/18 1608)   PRN Meds:.acetaminophen **OR** acetaminophen, bisacodyl, ipratropium-albuterol, ondansetron **OR** ondansetron (ZOFRAN) IV Allergies  Allergen Reactions  . Latex Rash  . Penicillins Rash    Did it involve swelling of the face/tongue/throat, SOB, or low BP? No Did it involve sudden or severe rash/hives, skin peeling, or any reaction on the inside of your mouth or nose? No Did you need to seek medical attention at a hospital or doctor's office? No When did it last happen? If all above answers are "NO", may proceed with cephalosporin use.   Vital Signs: BP 123/68 (BP Location: Left Arm)   Pulse 90   Temp 98.8 F (37.1 C) (Axillary)   Resp (!) 28   Ht _0  (1.6 m)   Wt 74.5 kg   SpO2 99%   BMI 29.09 kg/m  Pain Scale: PAINAD   Pain Score: Asleep   SpO2: SpO2: 99 % O2 Device:SpO2: 99 % O2 Flow Rate: .O2 Flow Rate (L/min): 5 L/min  IO: Intake/output summary:   Intake/Output Summary (Last 24 hours) at 06/02/2018 1648 Last data filed at 06/02/2018 1416 Gross per 24 hour  Intake 900 ml  Output 2575 ml  Net -1675 ml    LBM: Last BM Date: 06/01/18 Baseline Weight: Weight: 72 kg Most recent weight: Weight: 74.5  kg     Palliative Assessment/Data: PPS 30%    The above conversation was completed via telephone due to the visitor restrictions during the COVID-19 pandemic. Thorough chart review and discussion with necessary members of the care team was completed as part of assessment. All issues were discussed and addressed but no physical exam was performed.  Time Total: 70 minutes Greater than 50%  of this time was spent counseling and coordinating care related to the above assessment and plan.  Juel Burrow, DNP, AGNP-C Palliative Medicine Team [phone] Pager: [phone]

## 2018-06-02 NOTE — Progress Notes (Signed)
PROGRESS NOTE    Kelli RenshawMercedes Vazquez  ZOX:[id number] DOB: Jul 27, 1929 DOA: 05/28/2018 PCP: [name], [name], MD   Brief Narrative:  85 year old with history of hemorrhagic CVA in February 2020, status post trach and PEG, chronic A. fib, diastolic CHF presents the ER with complaints of fevers and shortness of breath.  Patient was found to be febrile and elevated WBC of 23.  SHe was also having higher oxygen requirement with 10 L trach collar.  Suspicion for pneumonia versus bronchial tracheitis therefore initially started on broad-spectrum antibiotic given previous history of Pseudomonas.  Tracheal aspirate was sent.  Pulmonary was consulted.  COVIDnegative.   Assessment & Plan:   Principal Problem:   SIRS (systemic inflammatory response syndrome) (HCC) Active Problems:   Status post tracheostomy (HCC)   Status post insertion of percutaneous endoscopic gastrostomy (PEG) tube (HCC)   Atrial fibrillation, chronic   Chronic diastolic (congestive) heart failure (HCC)   History of completed stroke   Pressure injury of skin  Sepsis secondary to healthcare acquired pneumonia Acute on chronic respiratory failure with hypoxia requiring 5 L oxygen with trach collar 28% FiO2 -Chronically on Trach Collar. Now requiring supp Oxygen. Cont IV Abx and freq suctioning. Tracheal aspirate- NGTD. Needs freq suctioning.  -Incentive spirometry and flutter valve.  Bronchodilators scheduled and PRN.  Unable to diurese more as its causing intravascular dehydration and rise on BUN/Cr.  -Procalcitonin 0.47, BNP now downtrending. Follow ProCal, d/c Abx when appropriate. WBC trended down but still having secretions. Add glycopyrolate to help with secretions.  -Repeat chest x-ray 5/29-stable left-sided basilar opacity concerning for pneumonia versus atelectasis with associated effusion.  -Continue bronchodilators.  Continue IV Solu-Medrol.  Hold diuretics (torsemide 40mg  po daily at home).   Consult Palliative care team to  help establish goals of care. Currently family is inclined to bring her with Ventilatory for short periods if needed and consider Hospice care. They very much wish to spend sometime with the patient and be with her before completely giving up on her.  Interperter line used [id number] Spoke with them on 5/30  History of hemorrhagic CVA -On aspirin.  History of atrial flutter/fibrillation - Continue home regimen of Cardizem and Coreg.  Not on anticoagulation due to hemorrhagic CVA.  Chronic diastolic congestive heart failure -Resume her home regimen of torsemide  Anemia of chronic disease -Hemoglobin appears to be stable.  Continue to monitor this.  DVT prophylaxis: Lovenox full code Code Status: Full code for now.  Family Communication: None today [facility] Need her Resp status to improve before she can be discharged.   Consultants:   Pulmonary  Procedures:   None  Antimicrobials:   Cefepime   Subjective: Respiraotry status appears better but still having freq suctioning. Remains mainly unresponsive.   Review of Systems Otherwise negative except as per HPI, including: Unable to obtain.   Objective: Vitals:   06/02/18 0737 06/02/18 0802 06/02/18 1118 06/02/18 1200  BP:  (!) 121/58  123/68  Pulse: 82 81 83 86  Resp: (!) 30 (!) 28 (!) 36 (!) 38  Temp:  98.6 F (37 C)  98.8 F (37.1 C)  TempSrc:  Axillary  Axillary  SpO2: 100% 98% 98% 100%  Weight:      Height:        Intake/Output Summary (Last 24 hours) at 06/02/2018 1241 Last data filed at 06/02/2018 1130 Gross per 24 hour  Intake 900 ml  Output 1775 ml  Net -875 ml   Filed  Weights   05/31/18 0429 06/01/18 0338 06/02/18 0414  Weight: 75.3 kg 76 kg 74.5 kg    Examination:  Constitutional: chronically ill. On 28% FiO2.  Eyes: PERRL, lids and conjunctivae normal ENMT: Mucous membranes are moist. Posterior pharynx clear of any exudate or lesions.Normal  dentition.  Neck: normal, supple, no masses, no thyromegaly Respiratory: bibisilar crackles and anterior coarse BS Cardiovascular: Regular rate and rhythm, no murmurs / rubs / gallops. No extremity edema. 2+ pedal pulses.   Abdomen: no tenderness, no masses palpated. No hepatosplenomegaly. Bowel sounds positive. + peg in place.  Musculoskeletal: no gross contractures.  Skin: no rashes, lesions, ulcers. No induration Neurologic: difficult to assess.  Psychiatric: Unable to assess   Data Reviewed:   CBC: Recent Labs  Lab 05/29/18 0207 05/30/18 0301 05/31/18 0436 06/01/18 0348 06/02/18 0441  WBC 23.8* 16.2* 17.3* 16.8* 12.5*  NEUTROABS 20.1*  --   --   --   --   HGB 10.5* 9.6* 9.4* 9.3* 8.9*  HCT 31.7* 28.8* 29.7* 29.7* 28.5*  MCV 88.3 87.8 90.0 92.5 90.2  PLT 363 343 372 369 394   Basic Metabolic Panel: Recent Labs  Lab 05/29/18 0207 05/30/18 0301 05/31/18 0436 06/01/18 0348 06/02/18 0441  NA 131* 134* 139 143 143  K 3.3* 4.0 3.9 4.2 3.0*  CL 80* 87* 95* 98 94*  CO2 33* 33*  GLUCOSE 98 116* 189* 170* 214*  BUN 61* 75* 69* 81* 100*  CREATININE 0.98 1.00 1.01* 1.07* 1.26*  CALCIUM 9.0 8.4* 8.8* 8.8* 8.9  MG  --  2.4 2.6* 2.6* 2.6*   GFR: Estimated Creatinine Clearance: 31 mL/min (A) (by C-G formula based on SCr of 1.26 mg/dL (H)). Liver Function Tests: Recent Labs  Lab 05/29/18 0207  AST 72*  ALT 62*  ALKPHOS 138*  BILITOT 0.8  PROT 7.5  ALBUMIN 2.4*   No results for input(s): LIPASE, AMYLASE in the last 168 hours. No results for input(s): AMMONIA in the last 168 hours. Coagulation Profile: No results for input(s): INR, PROTIME in the last 168 hours. Cardiac Enzymes: Recent Labs  Lab 05/29/18 0207 05/29/18 0518 05/29/18 1058 05/29/18 1559  TROPONINI 0.36* 0.39* 0.33* 0.28*   BNP (last 3 results) No results for input(s): PROBNP in the last 8760 hours. HbA1C: No results for input(s): HGBA1C in the last 72 hours. CBG: Recent Labs  Lab  06/01/18 2000 06/01/18 2339 06/02/18 0406 06/02/18 0759 06/02/18 1156  GLUCAP 180* 174* 216* 230* 203*   Lipid Profile: No results for input(s): CHOL, HDL, LDLCALC, TRIG, CHOLHDL, LDLDIRECT in the last 72 hours. Thyroid Function Tests: No results for input(s): TSH, T4TOTAL, FREET4, T3FREE, THYROIDAB in the last 72 hours. Anemia Panel: No results for input(s): VITAMINB12, FOLATE, FERRITIN, TIBC, IRON, RETICCTPCT in the last 72 hours. Sepsis Labs: Recent Labs  Lab 05/29/18 0126 05/29/18 0401 05/29/18 0745 05/30/18 0301 06/01/18 0842  PROCALCITON  --   --  0.42 0.47 0.51  LATICACIDVEN 1.6 1.3  --   --   --     Recent Results (from the past 240 hour(s))  SARS Coronavirus 2 (CEPHEID - Performed in [facility] hospital lab), Hosp Order     Status: None   Collection Time: 05/29/18  4:10 AM  Result Value Ref Range Status   SARS Coronavirus 2 NEGATIVE NEGATIVE Final    Comment: (NOTE) If result is NEGATIVE SARS-CoV-2 target nucleic acids are NOT DETECTED. The SARS-CoV-2 RNA is generally detectable in upper and lower  respiratory specimens during the acute phase of infection. The lowest  concentration of SARS-CoV-2 viral copies this assay can detect is 250  copies / mL. A negative result does not preclude SARS-CoV-2 infection  and should not be used as the sole basis for treatment or other  patient management decisions.  A negative result may occur with  improper specimen collection / handling, submission of specimen other  than nasopharyngeal swab, presence of viral mutation(s) within the  areas targeted by this assay, and inadequate number of viral copies  (<250 copies / mL). A negative result must be combined with clinical  observations, patient history, and epidemiological information. If result is POSITIVE SARS-CoV-2 target nucleic acids are DETECTED. The SARS-CoV-2 RNA is generally detectable in upper and lower  respiratory specimens dur ing the acute phase of infection.   Positive  results are indicative of active infection with SARS-CoV-2.  Clinical  correlation with patient history and other diagnostic information is  necessary to determine patient infection status.  Positive results do  not rule out bacterial infection or co-infection with other viruses. If result is PRESUMPTIVE POSTIVE SARS-CoV-2 nucleic acids MAY BE PRESENT.   A presumptive positive result was obtained on the submitted specimen  and confirmed on repeat testing.  While 2019 novel coronavirus  (SARS-CoV-2) nucleic acids may be present in the submitted sample  additional confirmatory testing may be necessary for epidemiological  and / or clinical management purposes  to differentiate between  SARS-CoV-2 and other Sarbecovirus currently known to infect humans.  If clinically indicated additional testing with an alternate test  methodology [id number]) is advised. The SARS-CoV-2 RNA is generally  detectable in upper and lower respiratory sp ecimens during the acute  phase of infection. The expected result is Negative. Fact Sheet for Patients:  BoilerBrush.com.cy Fact Sheet for Healthcare Providers: https://pope.com/ This test is not yet approved or cleared by the [place] FDA and has been authorized for detection and/or diagnosis of SARS-CoV-2 by FDA under an Emergency Use Authorization (EUA).  This EUA will remain in effect (meaning this test can be used) for the duration of the COVID-19 declaration under Section 564(b)(1) of the Act, 21 U.S.C. section 360bbb-3(b)(1), unless the authorization is terminated or revoked sooner. Performed at [facility], [address], [place], [place] [address]   MRSA PCR Screening     Status: None   Collection Time: 05/29/18 11:38 AM  Result Value Ref Range Status   MRSA by PCR NEGATIVE NEGATIVE Final    Comment:        The GeneXpert MRSA Assay (FDA approved for NASAL specimens only), is  one component of a comprehensive MRSA colonization surveillance program. It is not intended to diagnose MRSA infection nor to guide or monitor treatment for MRSA infections. Performed at [facility], [address], [place], [place] [address]   Culture, respiratory (non-expectorated)     Status: None   Collection Time: 05/29/18  3:40 PM  Result Value Ref Range Status   Specimen Description TRACHEAL ASPIRATE  Final   Special Requests NONE  Final   Gram Stain   Final    ABUNDANT WBC PRESENT, PREDOMINANTLY PMN NO ORGANISMS SEEN    Culture   Final    RARE Consistent with normal respiratory flora. Performed at [facility], [address], [place], [place] [address]    Report Status 05/31/2018 FINAL  Final         Radiology Studies: Dg Chest [facility]  1 View  Result Date: 06/01/2018 CLINICAL DATA:  Encounter for dyspnea EXAM: PORTABLE CHEST 1 VIEW COMPARISON:  Yesterday FINDINGS: Tracheostomy tube in place. Cardiomegaly and vascular pedicle widening. There is vascular congestion and pleural effusion on the left more than right. The left lower lobe is obscured or opacified. IMPRESSION: 1. Stable from yesterday. 2. Cardiomegaly, vascular congestion, and left more than right pleural effusion. Electronically Signed   By: Marnee Spring M.D.   On: 06/01/2018 15:48        Scheduled Meds: . aspirin  81 mg Per Tube Daily  . budesonide (PULMICORT) nebulizer solution  0.5 mg Nebulization BID  . carvedilol  3.125 mg Per Tube BID WC  . chlorhexidine  15 mL Mouth Rinse BID  . diltiazem  30 mg Per Tube Q6H  . docusate  100 mg Per Tube BID  . enoxaparin (LOVENOX) injection  40 mg Subcutaneous Daily  . feeding supplement (PRO-STAT SUGAR FREE 64)  30 mL Per Tube BID  . insulin aspart  0-9 Units Subcutaneous Q4H  . ipratropium-albuterol  3 mL Nebulization Q6H  . mouth rinse  15 mL Mouth Rinse q12n4p  . methylPREDNISolone (SOLU-MEDROL) injection  40 mg Intravenous Q8H  .  pantoprazole sodium  40 mg Per Tube Daily  . polyethylene glycol  17 g Per Tube BID  . torsemide  40 mg Oral Daily   Continuous Infusions: . ceFEPime (MAXIPIME) IV 2 g (06/02/18 0935)  . feeding supplement (OSMOLITE 1.2 CAL) 1,000 mL (06/01/18 1750)  . potassium chloride 10 mEq (06/02/18 1218)     LOS: 5 days   Time spent= 35 mins    Karna Abed Joline Maxcy, MD [facility]  If 7PM-7AM, please contact night-coverage [email or web address] 06/02/2018, 12:41 PM

## 2018-06-03 ENCOUNTER — Inpatient Hospital Stay (HOSPITAL_COMMUNITY): Payer: Medicare Other

## 2018-06-03 DIAGNOSIS — Z7189 Other specified counseling: Secondary | ICD-10-CM

## 2018-06-03 DIAGNOSIS — N19 Unspecified kidney failure: Secondary | ICD-10-CM

## 2018-06-03 DIAGNOSIS — Z515 Encounter for palliative care: Secondary | ICD-10-CM

## 2018-06-03 LAB — GLUCOSE, CAPILLARY
Glucose-Capillary: 133 mg/dL — ABNORMAL HIGH (ref 70–99)
Glucose-Capillary: 227 mg/dL — ABNORMAL HIGH (ref 70–99)
Glucose-Capillary: 227 mg/dL — ABNORMAL HIGH (ref 70–99)
Glucose-Capillary: 249 mg/dL — ABNORMAL HIGH (ref 70–99)
Glucose-Capillary: 250 mg/dL — ABNORMAL HIGH (ref 70–99)
Glucose-Capillary: 259 mg/dL — ABNORMAL HIGH (ref 70–99)
Glucose-Capillary: 261 mg/dL — ABNORMAL HIGH (ref 70–99)

## 2018-06-03 LAB — BRAIN NATRIURETIC PEPTIDE: B Natriuretic Peptide: 721.8 pg/mL — ABNORMAL HIGH (ref 0.0–100.0)

## 2018-06-03 LAB — CBC
HCT: 27.4 % — ABNORMAL LOW (ref 36.0–46.0)
Hemoglobin: 8.8 g/dL — ABNORMAL LOW (ref 12.0–15.0)
MCH: 28.9 pg (ref 26.0–34.0)
MCHC: 32.1 g/dL (ref 30.0–36.0)
MCV: 89.8 fL (ref 80.0–100.0)
Platelets: 418 10*3/uL — ABNORMAL HIGH (ref 150–400)
RBC: 3.05 MIL/uL — ABNORMAL LOW (ref 3.87–5.11)
RDW: 17.8 % — ABNORMAL HIGH (ref 11.5–15.5)
WBC: 11.2 10*3/uL — ABNORMAL HIGH (ref 4.0–10.5)
nRBC: 0 % (ref 0.0–0.2)

## 2018-06-03 LAB — BASIC METABOLIC PANEL
Anion gap: 16 — ABNORMAL HIGH (ref 5–15)
BUN: 121 mg/dL — ABNORMAL HIGH (ref 8–23)
CO2: 32 mmol/L (ref 22–32)
Calcium: 9 mg/dL (ref 8.9–10.3)
Chloride: 96 mmol/L — ABNORMAL LOW (ref 98–111)
Creatinine, Ser: 1.45 mg/dL — ABNORMAL HIGH (ref 0.44–1.00)
GFR calc Af Amer: 38 mL/min — ABNORMAL LOW (ref >60)
GFR calc non Af Amer: 33 mL/min — ABNORMAL LOW (ref >60)
Glucose, Bld: 237 mg/dL — ABNORMAL HIGH (ref 70–99)
Potassium: 3.1 mmol/L — ABNORMAL LOW (ref 3.5–5.1)
Sodium: 144 mmol/L (ref 135–145)

## 2018-06-03 LAB — URINALYSIS, ROUTINE W REFLEX MICROSCOPIC
Bilirubin Urine: NEGATIVE
Glucose, UA: NEGATIVE mg/dL
Ketones, ur: NEGATIVE mg/dL
Leukocytes,Ua: NEGATIVE
Nitrite: NEGATIVE
Protein, ur: 30 mg/dL — AB
Specific Gravity, Urine: 1.014 (ref 1.005–1.030)
pH: 6 (ref 5.0–8.0)

## 2018-06-03 LAB — MAGNESIUM: Magnesium: 2.5 mg/dL — ABNORMAL HIGH (ref 1.7–2.4)

## 2018-06-03 LAB — PROCALCITONIN: Procalcitonin: 0.43 ng/mL

## 2018-06-03 MED ORDER — POTASSIUM CHLORIDE 20 MEQ PO PACK
40.0000 meq | PACK | Freq: Once | ORAL | Status: AC
  Administered 2018-06-03: 40 meq
  Filled 2018-06-03 (×2): qty 2

## 2018-06-03 MED ORDER — SODIUM CHLORIDE 0.9 % IV SOLN
2.0000 g | INTRAVENOUS | Status: AC
  Administered 2018-06-03 – 2018-06-07 (×5): 2 g via INTRAVENOUS
  Filled 2018-06-03 (×5): qty 2

## 2018-06-03 MED ORDER — IPRATROPIUM-ALBUTEROL 0.5-2.5 (3) MG/3ML IN SOLN
3.0000 mL | Freq: Three times a day (TID) | RESPIRATORY_TRACT | Status: DC
  Administered 2018-06-03 – 2018-06-05 (×7): 3 mL via RESPIRATORY_TRACT
  Filled 2018-06-03 (×7): qty 3

## 2018-06-03 MED ORDER — LIDOCAINE HCL (PF) 1 % IJ SOLN
INTRAMUSCULAR | Status: AC | PRN
  Administered 2018-06-03: 10 mL

## 2018-06-03 MED ORDER — RACEPINEPHRINE HCL 2.25 % IN NEBU
0.5000 mL | INHALATION_SOLUTION | Freq: Once | RESPIRATORY_TRACT | Status: AC
  Administered 2018-06-03: 0.5 mL via RESPIRATORY_TRACT
  Filled 2018-06-03: qty 0.5

## 2018-06-03 MED ORDER — ENOXAPARIN SODIUM 30 MG/0.3ML ~~LOC~~ SOLN
30.0000 mg | Freq: Every day | SUBCUTANEOUS | Status: DC
  Administered 2018-06-04 – 2018-06-10 (×7): 30 mg via SUBCUTANEOUS
  Filled 2018-06-03 (×7): qty 0.3

## 2018-06-03 MED ORDER — METHYLPREDNISOLONE SODIUM SUCC 40 MG IJ SOLR: 40.0000 mg | Freq: Every day | INTRAMUSCULAR | Status: DC

## 2018-06-03 MED ORDER — POTASSIUM CHLORIDE 10 MEQ/100ML IV SOLN
10.0000 meq | INTRAVENOUS | Status: AC
  Filled 2018-06-03 (×5): qty 100

## 2018-06-03 MED ORDER — LIDOCAINE HCL 1 % IJ SOLN
INTRAMUSCULAR | Status: AC
  Filled 2018-06-03: qty 20

## 2018-06-03 NOTE — Progress Notes (Signed)
Inpatient Diabetes Program Recommendations  AACE/ADA: New Consensus Statement on Inpatient Glycemic Control (2015)  Target Ranges:  Prepandial:   less than 140 mg/dL      Peak postprandial:   less than 180 mg/dL (1-2 hours)      Critically ill patients:  140 - 180 mg/dL   Results for Kelli Vazquez, Kelli Vazquez (MRN [id number]) as of 06/03/2018 14:21  Ref. Range 06/01/2018 23:39 06/02/2018 04:06 06/02/2018 07:59 06/02/2018 11:56 06/02/2018 16:34 06/02/2018 20:06  Glucose-Capillary Latest Ref Range: 70 - 99 mg/dL 931 (H)  2 units NOVOLOG  216 (H)  3 units NOVOLOG  230 (H)  3 units NOVOLOG  203 (H)  3 units NOVOLOG  185 (H)  2 units NOVOLOG  222 (H)  3 units NOVOLOG    Results for Kelli Vazquez, Kelli Vazquez (MRN [id number]) as of 06/03/2018 14:21  Ref. Range 06/02/2018 23:56 06/03/2018 03:53 06/03/2018 09:11 06/03/2018 12:14  Glucose-Capillary Latest Ref Range: 70 - 99 mg/dL 507 (H)  3 units NOVOLOG  259 (H)  5 units NOVOLOG  249 (H)  3 units NOVOLOG  250 (H)  3 units NOVOLOG      Admit with: Fever/ SOB/ Acute Resp Failure (from Rehab)  History: CHF, Dementia, Hemorrhagic CVA (February), Chronic Trach/PEG  Rehab DM Meds: Novolog 1-3 units Q4 ours  Current Orders: Novolog Sensitive Correction Scale/ SSI (0-9 units) Q4 hours     Currently getting tube feeds 55cc/hour.  Also getting Solumedrol 40 mg Q8 hours.  CBGs consistently >200 mg/dl.     MD- Note that CBGs started to rise when the Solumedrol was started.  Please consider the following:  Start Novolog Tube Feed Coverage: Novolog 3 units Q4 hours  HOLD if tube feed HELD for any reason     --Will follow patient during hospitalization--  Ambrose Finland RN, MSN, CDE Diabetes Coordinator Inpatient Glycemic Control Team Team Pager: [phone] (8a-5p)

## 2018-06-03 NOTE — Progress Notes (Signed)
RT came to suction out patient and give a breathing treatment. Upon auscultation, noticed stridor in pts upper lobes bilaterally. Nurse auscultated and noticed the same sound. Text paged [facility] provider for an order for racemic epinephrine. Order received and carried out by RT. Pt lung sounds are clearer to auscultation without the high pitched sound as before. Monitoring HR and breathing. Will continue to monitor. Victorino December, RN

## 2018-06-03 NOTE — Progress Notes (Signed)
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 fib, diastolic CHF presents the ER with complaints of fevers and shortness of breath.  Patient was found to be febrile and elevated WBC of 23.  SHe was also having higher oxygen requirement with 10 L trach collar.  Suspicion for pneumonia versus bronchial tracheitis therefore initially started on broad-spectrum antibiotic given previous history of Pseudomonas.  Tracheal aspirate was sent.  Pulmonary was consulted.  COVIDnegative.   Assessment & Plan:   Principal Problem:   SIRS (systemic inflammatory response syndrome) (HCC) Active Problems:   Status post tracheostomy (HCC)   Status post insertion of percutaneous endoscopic gastrostomy (PEG) tube (HCC)   Atrial fibrillation, chronic   Chronic diastolic (congestive) heart failure (HCC)   History of completed stroke   Pressure ulcer, stage 2 (HCC)   Uremia due to inadequate renal perfusion  Sepsis secondary to healthcare acquired pneumonia; POA Acute on chronic respiratory failure with hypoxia requiring 5 L oxygen with trach collar 28% FiO2 -Chronically on Trach Collar. Now requiring supp Oxygen. Cont IV Abx and freq suctioning. Tracheal aspirate- NGTD Still needs quite freq suctioning.  -Incentive spirometry and flutter valve.  Bronchodilators scheduled and PRN.  Unable to diurese more as its causing intravascular dehydration and rise on BUN/Cr.  -Procalcitonin 0.47, BNP now downtrending. Follow ProCal, d/c Abx when appropriate. WBC trended down but still having secretions. glycopyrolate to help with secretions.  -Repeat chest x-ray 5/29-stable left-sided basilar opacity concerning for pneumonia versus atelectasis with associated effusion.  -Continue bronchodilators.  Continue IV Solu-Medrol.  Hold diuretics (torsemide   po daily at home).   Consult Palliative care team to help establish goals of care. Currently family is inclined to bring her with Ventilatory for short periods if needed and consider Hospice care. They very much wish to spend sometime with the patient and be with her before completely giving up on her.  Interperter line used [id number] Spoke with them on 5/30 Palliative care team awake to hear back from the family.   Uremia, Mild AKI -worsening of her chronic encephalopathy. No obvious signs of bleeding or hemodynamic instability. Bun continues to rise, today its 121. Will have Nephrology weigh in.   History of hemorrhagic CVA -On aspirin.  History of atrial flutter/fibrillation - Continue home regimen of Cardizem and Coreg.  Not on anticoagulation due to hemorrhagic CVA.  Chronic diastolic congestive heart failure -Resume her home regimen of torsemide  Anemia of chronic disease -Hemoglobin appears to be stable.  Continue to monitor this.  Stage II noninfected sacral pressure ulcer present prior to admission  DVT prophylaxis: Lovenox full code Code Status: Full code for now.  Family Communication: None today Disposition Plan: [facility] stay for resp support. Need her Resp status to improve before she can be discharged.   Consultants:   Pulmonary  Nephrology  Palliative Care  Procedures:   None  Antimicrobials:   Cefepime   Subjective: Overnight resp issues requring [place] Epi.  This morning appears better in terms of breathing. Verbally mostly unresponsive.   Review of Systems Otherwise negative except as per HPI, including: Unable to obtain.   Objective: Vitals:   06/03/18 0500 06/03/18 0842 06/03/18 0852 06/03/18 0912  BP:  127/62  123/70  Pulse:  77  79  Resp:  (!) 27  (!) 27  Temp:    97.6 F (36.4 C)  TempSrc:    Axillary  SpO2:  99% 100% 99%  Weight: 75.2 kg     Height:        Intake/Output Summary (Last 24 hours) at 06/03/2018 1117  Last data filed at 06/03/2018 1107 Gross per 24 hour  Intake 1655 ml  Output 1950 ml  Net -295 ml   Filed Weights   06/01/18 0338 06/02/18 0414 06/03/18 0500  Weight: 76 kg 74.5 kg 75.2 kg    Examination: Constitutional: Chronically ill, 28% FiO2 5 L Eyes: PERRL, lids and conjunctivae normal ENMT: Mucous membranes are moist. Posterior pharynx clear of any exudate or lesions.Normal dentition.  Neck: normal, supple, no masses, no thyromegaly Respiratory: Some bibasilar crackles Cardiovascular: Regular rate and rhythm, no murmurs / rubs / gallops. No extremity edema. 2+ pedal pulses. No carotid bruits.  Abdomen: no tenderness, no masses palpated. No hepatosplenomegaly. Bowel sounds positive.  PEG tube in place Musculoskeletal: no clubbing / cyanosis. No joint deformity upper and lower extremities. Good ROM, no contractures. Normal muscle tone.  Skin: Stage II sacral pressure ulcer without any evidence of infection Neurologic: Difficult to assess Psychiatric: Difficult to assess Left upper extremity PICC line prior to admission    Data Reviewed:   CBC: Recent Labs  Lab 05/29/18 0207 05/30/18 0301 05/31/18 0436 06/01/18 0348 06/02/18 0441 06/03/18 0520  WBC 23.8* 16.2* 17.3* 16.8* 12.5* 11.2*  NEUTROABS 20.1*  --   --   --   --   --   HGB 10.5* 9.6* 9.4* 9.3* 8.9* 8.8*  HCT 31.7* 28.8* 29.7* 29.7* 28.5* 27.4*  MCV 88.3 87.8 90.0 92.5 90.2 89.8  PLT 363 343 372 369 394 418*   Basic Metabolic Panel: Recent Labs  Lab 05/30/18 0301 05/31/18 0436 06/01/18 0348 06/02/18 0441 06/03/18 0520  NA 134* 139 143 143 144  K 4.0 3.9 4.2 3.0* 3.1*  CL 87* 95* 98 94* 96*  CO2 33* 32  GLUCOSE 116* 189* 170* 214* 237*  BUN 75* 69* 81* 100* 121*  CREATININE 1.00 1.01* 1.07* 1.26* 1.45*  CALCIUM 8.4* 8.8* 8.8* 8.9 9.0  MG 2.4 2.6* 2.6* 2.6* 2.5*   GFR: Estimated Creatinine Clearance: 27 mL/min (A) (by C-G formula based on SCr of 1.45 mg/dL (H)). Liver Function Tests:  Recent Labs  Lab 05/29/18 0207  AST 72*  ALT 62*  ALKPHOS 138*  BILITOT 0.8  PROT 7.5  ALBUMIN 2.4*   No results for input(s): LIPASE, AMYLASE in the last 168 hours. No results for input(s): AMMONIA in the last 168 hours. Coagulation Profile: No results for input(s): INR, PROTIME in the last 168 hours. Cardiac Enzymes: Recent Labs  Lab 05/29/18 0207 05/29/18 0518 05/29/18 1058 05/29/18 1559  TROPONINI 0.36* 0.39* 0.33* 0.28*   BNP (last 3 results) No results for input(s): PROBNP in the last 8760 hours. HbA1C: No results for input(s): HGBA1C in the last 72 hours. CBG: Recent Labs  Lab 06/02/18 1634 06/02/18 2006 06/02/18 2356 06/03/18 0353 06/03/18 0911  GLUCAP 185* 222* 246* 259* 249*   Lipid Profile: No results for input(s): CHOL, HDL, LDLCALC, TRIG, CHOLHDL, LDLDIRECT in the last 72 hours. Thyroid Function Tests: No results for input(s): TSH, T4TOTAL, FREET4, T3FREE, THYROIDAB in the last 72 hours. Anemia Panel: No results for input(s): VITAMINB12, FOLATE, FERRITIN, TIBC, IRON, RETICCTPCT in the last 72 hours. Sepsis Labs: Recent Labs  Lab 05/29/18 0126 05/29/18 0401 05/29/18 0745 05/30/18 0301 06/01/18 2956 06/03/18  0520  PROCALCITON  --   --  0.42 0.47 0.51 0.43  LATICACIDVEN 1.6 1.3  --   --   --   --     Recent Results (from the past 240 hour(s))  SARS Coronavirus 2 (CEPHEID - Performed in  hospital lab), Hosp Order     Status: None   Collection Time: 05/29/18  4:10 AM  Result Value Ref Range Status   SARS Coronavirus 2 NEGATIVE NEGATIVE Final    Comment: (NOTE) If result is NEGATIVE [facility]-CoV-2 target nucleic acids are NOT DETECTED. The SARS-CoV-2 RNA is generally detectable in upper and lower  respiratory specimens during the acute phase of infection. The lowest  concentration of SARS-CoV-2 viral copies this assay can detect is 250  copies / mL. A negative result does not preclude SARS-CoV-2 infection  and should not be used as  the sole basis for treatment or other  patient management decisions.  A negative result may occur with  improper specimen collection / handling, submission of specimen other  than nasopharyngeal swab, presence of viral mutation(s) within the  areas targeted by this assay, and inadequate number of viral copies  (<250 copies / mL). A negative result must be combined with clinical  observations, patient history, and epidemiological information. If result is POSITIVE SARS-CoV-2 target nucleic acids are DETECTED. The SARS-CoV-2 RNA is generally detectable in upper and lower  respiratory specimens dur ing the acute phase of infection.  Positive  results are indicative of active infection with SARS-CoV-2.  Clinical  correlation with patient history and other diagnostic information is  necessary to determine patient infection status.  Positive results do  not rule out bacterial infection or co-infection with other viruses. If result is PRESUMPTIVE POSTIVE SARS-CoV-2 nucleic acids MAY BE PRESENT.   A presumptive positive result was obtained on the submitted specimen  and confirmed on repeat testing.  While 2019 novel coronavirus  (SARS-CoV-2) nucleic acids may be present in the submitted sample  additional confirmatory testing may be necessary for epidemiological  and / or clinical management purposes  to differentiate between  SARS-CoV-2 and other Sarbecovirus currently known to infect humans.  If clinically indicated additional testing with an alternate test  methodology [phone]) is advised. The SARS-CoV-2 RNA is generally  detectable in upper and lower respiratory sp ecimens during the acute  phase of infection. The expected result is Negative. Fact Sheet for Patients:  BoilerBrush.com.cyhttps://www.fda.gov/media/136312/download Fact Sheet for Healthcare Providers: https://pope.com/https://www.fda.gov/media/136313/download This test is not yet approved or cleared by the [place] FDA and has been authorized for  detection and/or diagnosis of SARS-CoV-2 by FDA under an Emergency Use Authorization (EUA).  This EUA will remain in effect (meaning this test can be used) for the duration of the COVID-19 declaration under Section 564(b)(1) of the Act, 21 U.S.C. section 360bbb-3(b)(1), unless the authorization is terminated or revoked sooner. Performed at [facility], [address] [place], [place] [address]   MRSA PCR Screening     Status: None   Collection Time: 05/29/18 11:38 AM  Result Value Ref Range Status   MRSA by PCR NEGATIVE NEGATIVE Final    Comment:        The GeneXpert MRSA Assay (FDA approved for NASAL specimens only), is one component of a comprehensive MRSA colonization surveillance program. It is not intended to diagnose MRSA infection nor to guide or monitor treatment for MRSA infections. Performed at [facility], [address] [place], [place] [address]  Culture, respiratory (non-expectorated)     Status: None   Collection Time: 05/29/18  3:40 PM  Result Value Ref Range Status   Specimen Description TRACHEAL ASPIRATE  Final   Special Requests NONE  Final   Gram Stain   Final    ABUNDANT WBC PRESENT, PREDOMINANTLY PMN NO ORGANISMS SEEN    Culture   Final    RARE Consistent with normal respiratory flora. Performed at [facility], [address], [place], [place] [address]    Report Status 05/31/2018 FINAL  Final         Radiology Studies: Dg Chest Port 1 View  Result Date: 06/01/2018 CLINICAL DATA:  Encounter for dyspnea EXAM: PORTABLE CHEST 1 VIEW COMPARISON:  Yesterday FINDINGS: Tracheostomy tube in place. Cardiomegaly and vascular pedicle widening. There is vascular congestion and pleural effusion on the left more than right. The left lower lobe is obscured or opacified. IMPRESSION: 1. Stable from yesterday. 2. Cardiomegaly, vascular congestion, and left more than right pleural effusion. Electronically Signed   By: Marnee Spring M.D.    On: 06/01/2018 15:48        Scheduled Meds: . aspirin  81 mg Per Tube Daily  . budesonide (PULMICORT) nebulizer solution  0.5 mg Nebulization BID  . carvedilol  3.125 mg Per Tube BID WC  . chlorhexidine  15 mL Mouth Rinse BID  . diltiazem  30 mg Per Tube Q6H  . docusate  100 mg Per Tube BID  . [START ON 06/04/2018] enoxaparin (LOVENOX) injection  30 mg Subcutaneous Daily  . feeding supplement (PRO-STAT SUGAR FREE 64)  30 mL Per Tube BID  . glycopyrrolate  1 mg Per Tube BID  . insulin aspart  0-9 Units Subcutaneous Q4H  . ipratropium-albuterol  3 mL Nebulization TID  . mouth rinse  15 mL Mouth Rinse q12n4p  . methylPREDNISolone (SOLU-MEDROL) injection  40 mg Intravenous Q8H  . pantoprazole sodium  40 mg Per Tube Daily  . polyethylene glycol  17 g Per Tube BID   Continuous Infusions: . ceFEPime (MAXIPIME) IV Stopped (06/02/18 2254)  . feeding supplement (OSMOLITE 1.2 CAL) 1,000 mL (06/02/18 1756)  . potassium chloride       LOS: 6 days   Time spent= 35 mins    Jarret Torre Joline Maxcy, MD [facility]  If 7PM-7AM, please contact night-coverage [email or web address] 06/03/2018, 11:17 AM

## 2018-06-03 NOTE — Care Management Important Message (Signed)
Important Message  Patient Details  Name: Kelli Vazquez MRN: [id number] Date of Birth: Aug 14, 1929   Medicare Important Message Given:  Yes    Kelli Vazquez 06/03/2018, 4:00 PM

## 2018-06-03 NOTE — Procedures (Signed)
Chronic medical therapy  S/p RUE PICC TIP SVCRA NO COMP STABLE EBL MIN FULL REPORT IN PACS

## 2018-06-03 NOTE — Progress Notes (Signed)
RN Nehemiah Settle made aware that the patient was assessed by two IV nurses and midlines were attempted multiple times and was unsuccessful. The patients veins for a regular PIV are too small to attempt or the patient has swelling to her lower extremities. Patient will need a central line of some sort to obtain IV fluids.

## 2018-06-03 NOTE — Consult Note (Signed)
[facility] Renal Consultation Note  Requesting MD: Reesa Chew Indication for Consultation: AKI and uremia  HPI:  Kelli Vazquez is a 85 y.o. female with past medical history significant for CHF and atrial fibrillation.  She is status post a prolonged hospitalization from 1/16 to 2/18 when she had a large MCA stroke with hemorrhagic conversion with mass-effect.  Patient was counseled extensively at patient's prognosis but still elected for a trach/PEG tube and transferred to SNF.  Patient was readmitted to [facility] on 5/27 when patient was found to have a fever, white blood count of 23 and possibly some increased oxygen requirements.  Covid test was negative.  She was diagnosed with possible sepsis secondary to pneumonia and admitted.  Patient's renal function was noted to be quite good usually at less than 1.0.  Over the last 4 days, patient's creatinine has increased somewhat but more concerning her BUN has increased exponentially and is at a level of 120 today.  We are asked to comment on this.  Of note, patient is on a very high protein tube feed.  In addition, she is currently on IV steroids-Solu-Medrol total of 120 mg daily.  Her urine output has been reasonable, greater than 1600 the last 24 hours.  However, she does appear to be overall positive in her fluid status, urinalysis was bland on 5/27-no protein or blood.  Patient appears unresponsive to my exam today- she is always nonverbal but apparently at some point was more alert   Creatinine, Ser  Date/Time Value Ref Range Status  06/03/2018 05:20 AM 1.45 (H) 0.44 - 1.00 mg/dL Final  06/02/2018 04:41 AM 1.26 (H) 0.44 - 1.00 mg/dL Final  06/01/2018 03:48 AM 1.07 (H) 0.44 - 1.00 mg/dL Final  05/31/2018 04:36 AM 1.01 (H) 0.44 - 1.00 mg/dL Final  05/30/2018 03:01 AM 1.00 0.44 - 1.00 mg/dL Final  05/29/2018 02:07 AM 0.98 0.44 - 1.00 mg/dL Final  03/23/2018 04:02 AM 0.78 0.44 - 1.00 mg/dL Final  03/21/2018 06:01 AM 0.81 0.44 -  1.00 mg/dL Final  03/18/2018 07:32 AM 0.74 0.44 - 1.00 mg/dL Final  03/17/2018 06:14 AM 0.84 0.44 - 1.00 mg/dL Final  03/15/2018 06:23 AM 0.92 0.44 - 1.00 mg/dL Final  03/14/2018 06:50 AM 1.03 (H) 0.44 - 1.00 mg/dL Final  03/13/2018 05:32 AM 1.20 (H) 0.44 - 1.00 mg/dL Final  03/12/2018 06:08 AM 1.31 (H) 0.44 - 1.00 mg/dL Final  03/08/2018 05:06 AM 0.98 0.44 - 1.00 mg/dL Final  03/04/2018 05:02 AM 0.90 0.44 - 1.00 mg/dL Final  03/02/2018 05:36 AM 0.79 0.44 - 1.00 mg/dL Final  03/01/2018 06:16 AM 0.85 0.44 - 1.00 mg/dL Final  02/27/2018 05:46 AM 1.20 (H) 0.44 - 1.00 mg/dL Final  02/23/2018 05:22 AM 0.91 0.44 - 1.00 mg/dL Final  02/21/2018 04:40 AM 0.96 0.44 - 1.00 mg/dL Final  02/20/2018 03:56 AM 1.03 (H) 0.44 - 1.00 mg/dL Final  02/18/2018 08:59 AM 0.81 0.44 - 1.00 mg/dL Final  02/16/2018 07:07 AM 0.97 0.44 - 1.00 mg/dL Final  02/15/2018 03:20 AM 0.94 0.44 - 1.00 mg/dL Final  02/14/2018 03:57 AM 0.88 0.44 - 1.00 mg/dL Final  02/13/2018 04:50 AM 1.02 (H) 0.44 - 1.00 mg/dL Final  02/12/2018 05:41 AM 0.93 0.44 - 1.00 mg/dL Final  02/11/2018 04:29 AM 0.89 0.44 - 1.00 mg/dL Final  02/10/2018 02:42 AM 0.91 0.44 - 1.00 mg/dL Final  02/08/2018 05:53 AM 0.99 0.44 - 1.00 mg/dL Final  02/07/2018 04:08 AM 1.00 0.44 - 1.00 mg/dL Final  02/06/2018 05:30  AM 1.10 (H) 0.44 - 1.00 mg/dL Final  02/05/2018 11:16 AM 1.18 (H) 0.44 - 1.00 mg/dL Final  02/05/2018 05:13 AM 1.15 (H) 0.44 - 1.00 mg/dL Final  02/04/2018 01:43 AM 1.13 (H) 0.44 - 1.00 mg/dL Final  02/03/2018 10:17 AM 1.06 (H) 0.44 - 1.00 mg/dL Final  02/02/2018 06:09 AM 1.00 0.44 - 1.00 mg/dL Final  02/01/2018 05:19 AM 0.92 0.44 - 1.00 mg/dL Final  01/31/2018 04:40 AM 1.01 (H) 0.44 - 1.00 mg/dL Final  01/30/2018 05:43 AM 1.17 (H) 0.44 - 1.00 mg/dL Final  01/29/2018 04:36 PM 1.14 (H) 0.44 - 1.00 mg/dL Final  01/28/2018 06:39 AM 1.23 (H) 0.44 - 1.00 mg/dL Final  01/27/2018 04:05 PM 1.00 0.44 - 1.00 mg/dL Final  01/27/2018 03:53 PM 1.19 (H) 0.44  - 1.00 mg/dL Final     PMHx:   Past Medical History:  Diagnosis Date  . CHF (congestive heart failure) ([place])   . Dementia ([place])   . Dysrhythmia   . Stroke [facility])     Past Surgical History:  Procedure Laterality Date  . IR GASTROSTOMY TUBE MOD SED  02/18/2018    Family Hx:  Family History  Family history unknown: Yes    Social History:  has an unknown smoking status. She has never used smokeless tobacco. No history on file for alcohol and drug.  Allergies:  Allergies  Allergen Reactions  . Latex Rash  . Penicillins Rash    Did it involve swelling of the face/tongue/throat, SOB, or low BP? No Did it involve sudden or severe rash/hives, skin peeling, or any reaction on the inside of your mouth or nose? No Did you need to seek medical attention at a hospital or doctor's office? No When did it last happen? If all above answers are "NO", may proceed with cephalosporin use.    Medications: Prior to Admission medications   Medication Sig Start Date End Date Taking? Authorizing Provider  acetaminophen (TYLENOL) 325 MG tablet Take 2 tablets (650 mg total) by mouth every 4 (four) hours as needed for mild pain (or temp > 37.5 C (99.5 F)). Patient taking differently: Take 650 mg by mouth every 4 (four) hours as needed for mild pain.  02/19/18  Yes Kipp Brood, MD  acetaminophen (TYLENOL) 650 MG CR tablet Take 650 mg by mouth every 6 (six) hours as needed for pain.   Yes [provider]  aspirin 81 MG chewable tablet Place 1 tablet (81 mg total) into feeding tube daily. 02/20/18  Yes Agarwala, Einar Grad, MD  carvedilol (COREG) 3.125 MG tablet Place 3.125 mg into feeding tube 2 (two) times a day.   Yes [provider]  diltiazem (CARDIZEM) 30 MG tablet Place 30 mg into feeding tube every 6 (six) hours. 05/29/18  Yes [provider]  ipratropium-albuterol (DUONEB) 0.5-2.5 (3) MG/3ML SOLN Take 3 mLs by nebulization every 6 (six) hours as needed (SOB).   Yes  [provider]  Melatonin 3 MG TABS 3 mg by PEG Tube route at bedtime.   Yes [provider]  metolazone (ZAROXOLYN) 5 MG tablet Place 5 mg into feeding tube every Monday, Wednesday, and Friday.   Yes [provider]  Multiple Vitamins-Minerals (MULTIVITAMIN WITH MINERALS) tablet Place 1 tablet into feeding tube daily.   Yes [provider]  Omega-3 Fatty Acids (FISH OIL PO) Place 4 g into feeding tube daily.   Yes [provider]  polyethylene glycol (MIRALAX / GLYCOLAX) packet Place 17 g into feeding tube 2 (two)  times daily. Patient taking differently: Place 17 g into feeding tube daily.  02/19/18  Yes Kipp Brood, MD  scopolamine (TRANSDERM-SCOP) 1 MG/3DAYS Place 1 patch onto the skin every 3 (three) days.   Yes [provider]  sertraline (ZOLOFT) 50 MG tablet Place 50 mg into feeding tube daily.   Yes [provider]  tamsulosin (FLOMAX) 0.4 MG CAPS capsule Take 0.4 mg by mouth daily. 05/12/18  Yes [provider]  torsemide (DEMADEX) 20 MG tablet Place 2 tablets (40 mg total) into feeding tube daily. Patient taking differently: Place 20 mg into feeding tube 2 (two) times daily.  02/20/18  Yes Agarwala, Einar Grad, MD  Amino Acids-Protein Hydrolys (FEEDING SUPPLEMENT, PRO-STAT SUGAR FREE 64,) LIQD Place 30 mLs into feeding tube 2 (two) times daily. 02/19/18   Kipp Brood, MD  atorvastatin (LIPITOR) 20 MG tablet Place 1 tablet (20 mg total) into feeding tube daily at 6 PM. Patient not taking: Reported on 05/29/2018 02/19/18   Kipp Brood, MD  bisacodyl (DULCOLAX) 10 MG suppository Place 1 suppository (10 mg total) rectally daily as needed for moderate constipation. Patient not taking: Reported on 05/29/2018 02/19/18   Kipp Brood, MD  chlorhexidine gluconate, MEDLINE KIT, (PERIDEX) 0.12 % solution 15 mLs by Mouth Rinse route 2 (two) times daily. Patient not taking: Reported on 05/29/2018 02/19/18   Kipp Brood, MD   diltiazem (CARDIZEM) 10 mg/ml oral suspension Place 3 mLs (30 mg total) into feeding tube every 6 (six) hours. Patient not taking: Reported on 05/29/2018 02/19/18   Kipp Brood, MD  docusate (COLACE) 50 MG/5ML liquid Place 10 mLs (100 mg total) into feeding tube 2 (two) times daily. Patient not taking: Reported on 05/29/2018 02/19/18   Kipp Brood, MD  enoxaparin (LOVENOX) 40 MG/0.4ML injection Inject 0.4 mLs (40 mg total) into the skin daily. Patient not taking: Reported on 05/29/2018 02/20/18   Kipp Brood, MD  insulin aspart (NOVOLOG) 100 UNIT/ML injection Inject 1-3 Units into the skin every 4 (four) hours. Patient not taking: Reported on 05/29/2018 02/19/18   Kipp Brood, MD  mouth rinse LIQD solution 15 mLs by Mouth Rinse route every morning. Patient not taking: Reported on 05/29/2018 02/19/18   Kipp Brood, MD  Nutritional Supplements (FEEDING SUPPLEMENT, OSMOLITE 1.2 CAL,) LIQD Place 1,000 mLs into feeding tube continuous. 02/19/18   Kipp Brood, MD  pantoprazole sodium (PROTONIX) 40 mg/20 mL PACK Place 20 mLs (40 mg total) into feeding tube daily. Patient not taking: Reported on 05/29/2018 02/20/18   Kipp Brood, MD  Water For Irrigation, Sterile (FREE WATER) SOLN Place 400 mLs into feeding tube 2 times daily at 12 noon and 4 pm. 02/20/18   Kipp Brood, MD    I have reviewed the patient's current medications.  Labs:  Results for orders placed or performed during the hospital encounter of 05/28/18 (from the past 48 hour(s))  Glucose, capillary     Status: Abnormal   Collection Time: 06/01/18  5:08 PM  Result Value Ref Range   Glucose-Capillary 174 (H) 70 - 99 mg/dL  Glucose, capillary     Status: Abnormal   Collection Time: 06/01/18  8:00 PM  Result Value Ref Range   Glucose-Capillary 180 (H) 70 - 99 mg/dL  Glucose, capillary     Status: Abnormal   Collection Time: 06/01/18 11:39 PM  Result Value Ref Range   Glucose-Capillary 174 (H) 70 - 99 mg/dL  Glucose,  capillary     Status: Abnormal   Collection Time: 06/02/18  4:06  AM  Result Value Ref Range   Glucose-Capillary 216 (H) 70 - 99 mg/dL  Basic metabolic panel     Status: Abnormal   Collection Time: 06/02/18  4:41 AM  Result Value Ref Range   Sodium 143 135 - 145 mmol/L   Potassium 3.0 (L) 3.5 - 5.1 mmol/L   Chloride 94 (L) 98 - 111 mmol/L   CO2 33 (H) 22 - 32 mmol/L   Glucose, Bld 214 (H) 70 - 99 mg/dL   BUN 100 (H) 8 - 23 mg/dL   Creatinine, Ser 1.26 (H) 0.44 - 1.00 mg/dL   Calcium 8.9 8.9 - 10.3 mg/dL   GFR calc non Af Amer 39 (L) >60 mL/min   GFR calc Af Amer 45 (L) >60 mL/min   Anion gap 16 (H) 5 - 15    Comment: Performed at [facility], [place] [address] [place], [place] [address]  Magnesium     Status: Abnormal   Collection Time: 06/02/18  4:41 AM  Result Value Ref Range   Magnesium 2.6 (H) 1.7 - 2.4 mg/dL    Comment: Performed at [facility] [address] [place], [place] [address]  CBC     Status: Abnormal   Collection Time: 06/02/18  4:41 AM  Result Value Ref Range   WBC 12.5 (H) 4.0 - 10.5 K/uL   RBC 3.16 (L) 3.87 - 5.11 MIL/uL   Hemoglobin 8.9 (L) 12.0 - 15.0 g/dL   HCT 28.5 (L) 36.0 - 46.0 %   MCV 90.2 80.0 - 100.0 fL   MCH 28.2 26.0 - 34.0 pg   MCHC 31.2 30.0 - 36.0 g/dL   RDW 17.5 (H) 11.5 - 15.5 %   Platelets 394 150 - 400 K/uL   nRBC 0.0 0.0 - 0.2 %    Comment: Performed at [facility], [place] [address], [place], [place] [address]  Brain natriuretic peptide     Status: Abnormal   Collection Time: 06/02/18  4:41 AM  Result Value Ref Range   B Natriuretic Peptide 941.4 (H) 0.0 - 100.0 pg/mL    Comment: Performed at [facility] [address], [place], [place] [address]  Glucose, capillary     Status: Abnormal   Collection Time: 06/02/18  7:59 AM  Result Value Ref Range   Glucose-Capillary 230 (H) 70 - 99 mg/dL   Comment 1 Notify RN    Comment 2 Document in Chart   Glucose, capillary     Status: Abnormal   Collection Time:  06/02/18 11:56 AM  Result Value Ref Range   Glucose-Capillary 203 (H) 70 - 99 mg/dL   Comment 1 Notify RN    Comment 2 Document in Chart   Glucose, capillary     Status: Abnormal   Collection Time: 06/02/18  4:34 PM  Result Value Ref Range   Glucose-Capillary 185 (H) 70 - 99 mg/dL   Comment 1 Notify RN    Comment 2 Document in Chart   Glucose, capillary     Status: Abnormal   Collection Time: 06/02/18  8:06 PM  Result Value Ref Range   Glucose-Capillary 222 (H) 70 - 99 mg/dL  Glucose, capillary     Status: Abnormal   Collection Time: 06/02/18 11:56 PM  Result Value Ref Range   Glucose-Capillary 246 (H) 70 - 99 mg/dL  Glucose, capillary     Status: Abnormal   Collection Time: 06/03/18  3:53 AM  Result Value Ref Range  Glucose-Capillary 259 (H) 70 - 99 mg/dL  Brain natriuretic peptide     Status: Abnormal   Collection Time: 06/03/18  5:20 AM  Result Value Ref Range   B Natriuretic Peptide 721.8 (H) 0.0 - 100.0 pg/mL    Comment: Performed at [facility] [address], [place], [place] [address]  Procalcitonin - Baseline     Status: None   Collection Time: 06/03/18  5:20 AM  Result Value Ref Range   Procalcitonin 0.43 ng/mL    Comment:        Interpretation: PCT (Procalcitonin) <= 0.5 ng/mL: Systemic infection (sepsis) is not likely. Local bacterial infection is possible. (NOTE)       Sepsis PCT Algorithm           Lower Respiratory Tract                                      Infection PCT Algorithm    ----------------------------     ----------------------------         PCT < 0.25 ng/mL                PCT < 0.10 ng/mL         Strongly encourage             Strongly discourage   discontinuation of antibiotics    initiation of antibiotics    ----------------------------     -----------------------------       PCT 0.25 - 0.50 ng/mL            PCT 0.10 - 0.25 ng/mL               OR       >80% decrease in PCT            Discourage initiation of                                             antibiotics      Encourage discontinuation           of antibiotics    ----------------------------     -----------------------------         PCT >= 0.50 ng/mL              PCT 0.26 - 0.50 ng/mL               AND        <80% decrease in PCT             Encourage initiation of                                             antibiotics       Encourage continuation           of antibiotics    ----------------------------     -----------------------------        PCT >= 0.50 ng/mL                  PCT > 0.50 ng/mL               AND  increase in PCT                  Strongly encourage                                      initiation of antibiotics    Strongly encourage escalation           of antibiotics                                     -----------------------------                                           PCT <= 0.25 ng/mL                                                 OR                                        > 80% decrease in PCT                                     Discontinue / Do not initiate                                             antibiotics Performed at [facility], [address], [place], [place] [address]   CBC     Status: Abnormal   Collection Time: 06/03/18  5:20 AM  Result Value Ref Range   WBC 11.2 (H) 4.0 - 10.5 K/uL   RBC 3.05 (L) 3.87 - 5.11 MIL/uL   Hemoglobin 8.8 (L) 12.0 - 15.0 g/dL   HCT 27.4 (L) 36.0 - 46.0 %   MCV 89.8 80.0 - 100.0 fL   MCH 28.9 26.0 - 34.0 pg   MCHC 32.1 30.0 - 36.0 g/dL   RDW 17.8 (H) 11.5 - 15.5 %   Platelets 418 (H) 150 - 400 K/uL   nRBC 0.0 0.0 - 0.2 %    Comment: Performed at [facility] [address] [place], [place] [address]  Basic metabolic panel     Status: Abnormal   Collection Time: 06/03/18  5:20 AM  Result Value Ref Range   Sodium 144 135 - 145 mmol/L   Potassium 3.1 (L) 3.5 - 5.1 mmol/L   Chloride 96 (L) 98 - 111 mmol/L   CO2 32 22 - 32 mmol/L   Glucose, Bld 237 (H) 70 -  99 mg/dL   BUN 121 (H) 8 - 23 mg/dL   Creatinine, Ser 1.45 (H) 0.44 - 1.00 mg/dL   Calcium 9.0 8.9 - 10.3 mg/dL   GFR calc non Af Amer 33 (L) >60 mL/min   GFR calc Af Amer 38 (L) >60 mL/min   Anion gap  16 (H) 5 - 15    Comment: Performed at [facility], [place] [address], [place], [place] [address]  Magnesium     Status: Abnormal   Collection Time: 06/03/18  5:20 AM  Result Value Ref Range   Magnesium 2.5 (H) 1.7 - 2.4 mg/dL    Comment: Performed at [facility] [address], [place], [place] [address]  Glucose, capillary     Status: Abnormal   Collection Time: 06/03/18  9:11 AM  Result Value Ref Range   Glucose-Capillary 249 (H) 70 - 99 mg/dL  Glucose, capillary     Status: Abnormal   Collection Time: 06/03/18 12:14 PM  Result Value Ref Range   Glucose-Capillary 250 (H) 70 - 99 mg/dL     ROS:  Review of systems not obtained due to patient factors.  Physical Exam: Vitals:   06/03/18 1216 06/03/18 1220  BP: 131/85 131/85  Pulse: 75 81  Resp: (!) 31 (!) 31  Temp: 97.6 F (36.4 C)   SpO2: 97% 99%     General: obese, unresponsive to my exam HEENT: PERRLA, trach Neck: trach, no JVD Heart: RRR Lungs: poor effort  Abdomen: soft, PEG in place  Extremities: pitting dep edema Skin: warm and dry Neuro: unresponsive  Assessment/Plan: 85 year old BF recent large CVA with hemorrhagic conversion- now presents with ?PNA- during course of hospitalization has developed some AKI and worsening azotemia  1.Renal- worsening renal function and more significantly - uremia out of proportion to her AKI with good UOP.  The most common causes for uremia OOP for creatinine are GIB- which she does have a little drifting down of her hgb but not major and she is on a PPI. The other causes can be steroids and she is on fairly high dose so would appreciate weaning those down if possible.  Another cause can be high protein tube feeds which she is getting.   I have written a order for  nutrition to look and see if can be changed to a lower protein tube feed.  The final reason is if pt is volume depleted.  I do not think that she is based on her  CXR and I's and O's.  Hopefully this will start to turn around because she is not a candidate for dialysis due to her devastating neurologic diagnosis without improvement since February  2. Hypertension/volume  - BP is fine, if anything she is likely volume overloaded , however, would be hesitant right now to diurese as this will make her BUN go even higher.  I think she is likely third spacing given her nutritional status 3. Anemia  - is low and drifting down.  Conservative supportive care for now- transfuse PRN  4. Hypokalemia- given repletion thru tube since IV access is problematic    Louis Meckel 06/03/2018, 1:38 PM

## 2018-06-04 LAB — RENAL FUNCTION PANEL
Albumin: 2 g/dL — ABNORMAL LOW (ref 3.5–5.0)
Anion gap: 12 (ref 5–15)
BUN: 135 mg/dL — ABNORMAL HIGH (ref 8–23)
CO2: 36 mmol/L — ABNORMAL HIGH (ref 22–32)
Calcium: 8.9 mg/dL (ref 8.9–10.3)
Chloride: 97 mmol/L — ABNORMAL LOW (ref 98–111)
Creatinine, Ser: 1.52 mg/dL — ABNORMAL HIGH (ref 0.44–1.00)
GFR calc Af Amer: 36 mL/min — ABNORMAL LOW (ref >60)
GFR calc non Af Amer: 31 mL/min — ABNORMAL LOW (ref >60)
Glucose, Bld: 274 mg/dL — ABNORMAL HIGH (ref 70–99)
Phosphorus: 4 mg/dL (ref 2.5–4.6)
Potassium: 3.6 mmol/L (ref 3.5–5.1)
Sodium: 145 mmol/L (ref 135–145)

## 2018-06-04 LAB — MAGNESIUM: Magnesium: 2.6 mg/dL — ABNORMAL HIGH (ref 1.7–2.4)

## 2018-06-04 LAB — GLUCOSE, CAPILLARY
Glucose-Capillary: 265 mg/dL — ABNORMAL HIGH (ref 70–99)
Glucose-Capillary: 242 mg/dL — ABNORMAL HIGH (ref 70–99)
Glucose-Capillary: 236 mg/dL — ABNORMAL HIGH (ref 70–99)
Glucose-Capillary: 216 mg/dL — ABNORMAL HIGH (ref 70–99)
Glucose-Capillary: 174 mg/dL — ABNORMAL HIGH (ref 70–99)

## 2018-06-04 LAB — CBC
HCT: 27.1 % — ABNORMAL LOW (ref 36.0–46.0)
Hemoglobin: 8.7 g/dL — ABNORMAL LOW (ref 12.0–15.0)
MCH: 29.2 pg (ref 26.0–34.0)
MCHC: 32.1 g/dL (ref 30.0–36.0)
MCV: 90.9 fL (ref 80.0–100.0)
Platelets: 394 10*3/uL (ref 150–400)
RBC: 2.98 MIL/uL — ABNORMAL LOW (ref 3.87–5.11)
RDW: 17.7 % — ABNORMAL HIGH (ref 11.5–15.5)
WBC: 12.8 10*3/uL — ABNORMAL HIGH (ref 4.0–10.5)
nRBC: 0 % (ref 0.0–0.2)

## 2018-06-04 MED ORDER — OSMOLITE 1.2 CAL PO LIQD
1000.0000 mL | ORAL | Status: DC
  Administered 2018-06-04 – 2018-06-10 (×7): 1000 mL
  Filled 2018-06-04 (×13): qty 1000

## 2018-06-04 MED ORDER — PANTOPRAZOLE SODIUM 40 MG PO PACK: 40.0000 mg | PACK | Freq: Every day | ORAL | Status: DC

## 2018-06-04 MED ORDER — INSULIN ASPART 100 UNIT/ML ~~LOC~~ SOLN
3.0000 [IU] | SUBCUTANEOUS | Status: DC
  Administered 2018-06-04 – 2018-06-11 (×42): 3 [IU] via SUBCUTANEOUS

## 2018-06-04 MED ORDER — PANTOPRAZOLE SODIUM 40 MG PO PACK
40.0000 mg | PACK | Freq: Every day | ORAL | Status: DC
  Administered 2018-06-04 – 2018-06-11 (×8): 40 mg
  Filled 2018-06-04 (×8): qty 20

## 2018-06-04 NOTE — NC FL2 (Signed)
[place] MEDICAID FL2 LEVEL OF CARE SCREENING TOOL     IDENTIFICATION  Patient Name: Kelli Vazquez Birthdate: 22-Mar-1929 Sex: female Admission Date (Current Location): 05/28/2018  [facility] and [place] Number:  Producer, television/film/video and Address:  [facility], [address] [place], [place] [address]      Provider Number: [id number]  Attending Physician Name and Address:  Dimple Nanas, MD  Relative Name and Phone Number:       Current Level of Care: Hospital Recommended Level of Care: SNF (with palliative ) Prior Approval Number:    Date Approved/Denied:   PASRR Number: [id number]  Discharge Plan: SNF (with palliative.)    Current Diagnoses: Patient Active Problem List   Diagnosis Date Noted  . Uremia due to inadequate renal perfusion 06/03/2018  . SIRS (systemic inflammatory response syndrome) (HCC) 05/29/2018  . Chronic diastolic (congestive) heart failure (HCC) 05/29/2018  . History of completed stroke 05/29/2018  . Pressure ulcer, stage 2 (HCC) 05/29/2018  . Atrial fibrillation, chronic 02/20/2018  . Acute on chronic diastolic CHF (congestive heart failure) (HCC) 02/20/2018  . Status post insertion of percutaneous endoscopic gastrostomy (PEG) tube (HCC) 02/19/2018  . Status post tracheostomy (HCC)   . Acute respiratory failure with hypoxemia (HCC)   . Goals of care, counseling/discussion   . Palliative care by specialist   . Acute ischemic left MCA stroke (HCC) 01/27/2018    Orientation RESPIRATION BLADDER Height & Weight     (Unable to assess.)  Tracheostomy(28%. Per RN only suctioned 1-2 times per day.) Incontinent, External catheter Weight: 162 lb 7.7 oz (73.7 kg) Height:  5\' 3"  (160 cm)  BEHAVIORAL SYMPTOMS/MOOD NEUROLOGICAL BOWEL NUTRITION STATUS  (None) (History of stroke.) Incontinent Feeding tube(PEG)  AMBULATORY STATUS COMMUNICATION OF NEEDS Skin       Bruising, Other (Comment), PU Stage and Appropriate Care(Catheter  entry/exit, skin tear.)   PU Stage 2 Dressing: (Left anterior lower arm: Foam prn (Lift dressing to assess site every shift).)                   Personal Care Assistance Level of Assistance              Functional Limitations Info  Sight, Hearing(Responds to pain. Follow commands.) Sight Info: Adequate Hearing Info: Adequate      SPECIAL CARE FACTORS FREQUENCY                       Contractures Contractures Info: Not present    Additional Factors Info  Code Status, Allergies Code Status Info: Full code Allergies Info: Latex, Penicillins.           Current Medications (06/04/2018):  This is the current hospital active medication list Current Facility-Administered Medications  Medication Dose Route Frequency Provider Last Rate Last Dose  . acetaminophen (TYLENOL) tablet 650 mg  650 mg Oral Q6H PRN Eduard Clos, MD   650 mg at 05/31/18 2320   Or  . acetaminophen (TYLENOL) suppository 650 mg  650 mg Rectal Q6H PRN Eduard Clos, MD      . aspirin chewable tablet 81 mg  81 mg Per Tube Daily Eduard Clos, MD   81 mg at 06/04/18 0935  . bisacodyl (DULCOLAX) suppository 10 mg  10 mg Rectal Daily PRN Eduard Clos, MD      . budesonide (PULMICORT) nebulizer solution 0.5 mg  0.5 mg Nebulization BID Amin, Loura Halt, MD   0.5  mg at 06/04/18 0804  . carvedilol (COREG) tablet 3.125 mg  3.125 mg Per Tube BID WC Eduard ClosKakrakandy, Arshad N, MD   3.125 mg at 06/04/18 0934  . ceFEPIme (MAXIPIME) 2 g in sodium chloride 0.9 % 100 mL IVPB  2 g Intravenous Q24H Daylene PoseyOriet, Jonathan, [facility]   Stopped at 06/03/18 2227  . chlorhexidine (PERIDEX) 0.12 % solution 15 mL  15 mL Mouth Rinse BID Amin, Ankit Chirag, MD   15 mL at 06/04/18 0936  . diltiazem (CARDIZEM) tablet 30 mg  30 mg Per Tube Q6H Eduard ClosKakrakandy, Arshad N, MD   30 mg at 06/04/18 0501  . docusate (COLACE) 50 MG/5ML liquid 100 mg  100 mg Per Tube BID Eduard ClosKakrakandy, Arshad N, MD   100 mg at 06/02/18 0955  .  enoxaparin (LOVENOX) injection 30 mg  30 mg Subcutaneous Daily Amin, Ankit Chirag, MD   30 mg at 06/04/18 0936  . feeding supplement (OSMOLITE 1.2 CAL) liquid 1,000 mL  1,000 mL Per Tube Continuous Amin, Ankit Chirag, MD      . glycopyrrolate (ROBINUL) tablet 1 mg  1 mg Per Tube BID Amin, Ankit Chirag, MD   1 mg at 06/04/18 0935  . insulin aspart (novoLOG) injection 0-9 Units  0-9 Units Subcutaneous Q4H Eduard ClosKakrakandy, Arshad N, MD   3 Units at 06/04/18 0935  . ipratropium-albuterol (DUONEB) 0.5-2.5 (3) MG/3ML nebulizer solution 3 mL  3 mL Nebulization Q4H PRN Amin, Ankit Chirag, MD   3 mL at 06/03/18 0456  . ipratropium-albuterol (DUONEB) 0.5-2.5 (3) MG/3ML nebulizer solution 3 mL  3 mL Nebulization TID Amin, Ankit Chirag, MD   3 mL at 06/04/18 0804  . MEDLINE mouth rinse  15 mL Mouth Rinse q12n4p Amin, Ankit Chirag, MD   15 mL at 06/02/18 1630  . ondansetron (ZOFRAN) tablet 4 mg  4 mg Oral Q6H PRN Eduard ClosKakrakandy, Arshad N, MD       Or  . ondansetron [facility](ZOFRAN) injection 4 mg  4 mg Intravenous Q6H PRN Eduard ClosKakrakandy, Arshad N, MD      . pantoprazole sodium (PROTONIX) 40 mg/20 mL oral suspension 40 mg  40 mg Per Tube Daily Eduard ClosKakrakandy, Arshad N, MD   40 mg at 06/03/18 0915  . polyethylene glycol (MIRALAX / GLYCOLAX) packet 17 g  17 g Per Tube BID Eduard ClosKakrakandy, Arshad N, MD   17 g at 06/02/18 [id number]     Discharge Medications: Please see discharge summary for a list of discharge medications.  Relevant Imaging Results:  Relevant Lab Results:   Additional Information SS#: [id number]  Margarito LinerSarah C Fedora Knisely, LCSW

## 2018-06-04 NOTE — Progress Notes (Signed)
Pharmacy Antibiotic Note  Kelli Vazquez is a 85 y.o. female with h/o tracheostomy admitted from [facility] on 05/28/2018 with fevers/SOB/HCAP.  Pharmacy consulted for Vancomycin and Cefepime dosing.  Vancomycin d/c'd 5/29.  Scr /BUN worsening.  Afebrile, WBC 12.8k  Plan: Cefepime dose decreased on 6/1 to 2 g IV q24h - f/u LOT Monitor clinical status, renal function and culture results daily.    Height: 5\' 3"  (160 cm) Weight: 162 lb 7.7 oz (73.7 kg) IBW/kg (Calculated) : 52.4  Temp (24hrs), Avg:97.5 F (36.4 C), Min:96.6 F (35.9 C), Max:98.6 F (37 C)   Recent Labs  Lab 05/29/18 0126  05/29/18 0401  05/31/18 0436 06/01/18 0348 06/02/18 0441 06/03/18 0520 06/04/18 0443  WBC  --    < >  --    < > 17.3* 16.8* 12.5* 11.2* 12.8*  CREATININE  --    < >  --    < > 1.01* 1.07* 1.26* 1.45* 1.52*  LATICACIDVEN 1.6  --  1.3  --   --   --   --   --   --    < > = values in this interval not displayed.    Estimated Creatinine Clearance: 25.5 mL/min (A) (by C-G formula based on SCr of 1.52 mg/dL (H)).    Allergies  Allergen Reactions  . Latex Rash  . Penicillins Rash    Did it involve swelling of the face/tongue/throat, SOB, or low BP? No Did it involve sudden or severe rash/hives, skin peeling, or any reaction on the inside of your mouth or nose? No Did you need to seek medical attention at a hospital or doctor's office? No When did it last happen? If all above answers are "NO", may proceed with cephalosporin use.    Vancomycin 5/27>5/29  Cefepime 5/27>   Covid negative MRSA PCR negative  5/27 TA > Normal flora  Noah Delaine, RPh Clinical Pharmacist Phone [phone] Please check AMION for all [facility] Pharmacy phone numbers After 10:00 PM, call Main Pharmacy [phone]  06/04/2018 4:55 PM

## 2018-06-04 NOTE — Progress Notes (Addendum)
Inpatient Diabetes Program Recommendations  AACE/ADA: New Consensus Statement on Inpatient Glycemic Control (2015)  Target Ranges:  Prepandial:   less than 140 mg/dL      Peak postprandial:   less than 180 mg/dL (1-2 hours)      Critically ill patients:  140 - 180 mg/dL   Lab Results  Component Value Date   GLUCAP 236 (H) 06/04/2018   HGBA1C 6.1 (H) 02/20/2018     Results for Kelli, Vazquez (MRN [id number]) as of 06/04/2018 12:37  Ref. Range 06/03/2018 09:11 06/03/2018 12:14 06/03/2018 17:27 06/03/2018 19:44 06/03/2018 23:41 06/04/2018 04:30 06/04/2018 08:20 06/04/2018 12:22  Glucose-Capillary Latest Ref Range: 70 - 99 mg/dL 248 (H) 250 (H) 037 (H) 227 (H) 261 (H) 265 (H) 242 (H) 236 (H)       Review of Glycemic Control  Outpatient Diabetes medications: Novolog 1-3 units Q4 hours  Current orders for Inpatient glycemic control: Novolog (0-9 units) Q4  IV steroids stopped yesterday am. CBG still above target. TF @ 54ml/hour    MD please consider the following:  Start Novolog Tube Feed Coverage: Novolog 3 units Q4 hours  HOLD if tube feed HELD for any reason   -- Will follow during hospitalization.--  Jamelle Rushing RN, MSN Diabetes Coordinator Inpatient Glycemic Control Team Team Pager: [phone] (8am-5pm)

## 2018-06-04 NOTE — Progress Notes (Signed)
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 fib, diastolic CHF presents the ER with complaints of fevers and shortness of breath.  Patient was found to be febrile and elevated WBC of 23.  SHe was also having higher oxygen requirement with 10 L trach collar.  Suspicion for pneumonia versus bronchial tracheitis therefore initially started on broad-spectrum antibiotic given previous history of Pseudomonas.  Tracheal aspirate was sent.  Pulmonary was consulted.  COVIDnegative. Due to worsening BUN/Cr ratio, nephro consulted.    Assessment & Plan:   Principal Problem:   SIRS (systemic inflammatory response syndrome) (HCC) Active Problems:   Status post tracheostomy ([place])   Status post insertion of percutaneous endoscopic gastrostomy (PEG) tube (HCC)   Atrial fibrillation, chronic   Chronic diastolic (congestive) heart failure (HCC)   History of completed stroke   Pressure ulcer, stage 2 (HCC)   Uremia due to inadequate renal perfusion  Sepsis secondary to healthcare acquired pneumonia; POA Acute on chronic respiratory failure with hypoxia, improved - today on RA trach collar.  -Chronically on Trach Collar. Today on Room air.  -Incentive spirometry and flutter valve.  Bronchodilators scheduled and PRN.  Unable to diurese more as its causing intravascular dehydration and rise on BUN/Cr.  -Procalcitonin 0.47, BNP now downtrending. Follow ProCal, d/c Abx when appropriate. WBC trended down but still having secretions. glycopyrolate to help with secretions.  -Repeat chest x-ray 5/29-stable left-sided basilar opacity concerning for pneumonia versus atelectasis with associated effusion.  -Continue bronchodilators.  Stopped IV Solu-Medrol due to elevated BUN.  Hold diuretics (torsemide 70m po daily at home) due to renal  function. .   Consult Palliative care team to help establish goals of care. Currently family is inclined to bring her with Ventilatory for short periods if needed and consider Hospice care. They very much wish to spend sometime with the patient and be with her before completely giving up on her.  Interperter line used [id number] with them on 5/30 Palliative care team awake to hear back from the family.   Uremia, Mild AKI; worsening.  -worsening of her chronic encephalopathy. No obvious signs of bleeding or hemodynamic instability. Appreciate Nephro team input. Stopped Solumedrol. Monitor daily labs.   History of hemorrhagic CVA -On aspirin.  History of atrial flutter/fibrillation - Continue home regimen of Cardizem and Coreg.  Not on anticoagulation due to hemorrhagic CVA.  Chronic diastolic congestive heart failure -Resume her home regimen of torsemide  Anemia of chronic disease -Hemoglobin appears to be stable.  Continue to monitor this.  Stage II noninfected sacral pressure ulcer present prior to admission  Due to difficult IV Access and need for medications, PICC placed yesterday  DVT prophylaxis: Lovenox full code Code Status: Full code for now.  Family Communication: None today Disposition Plan: maintain hosp stay until renal function has improved. In the meantime awaiting palliative team input.   Consultants:   Pulmonary  Nephrology  Palliative Care  Procedures:   PICC placed 6/1  Antimicrobials:   Cefepime   Subjective: Still low temp this morning but breathing is better. On Room air. Good urine output.  Minimal interaction.   Review of Systems Otherwise negative except as per HPI, including: Unable to obtain.   Objective: Vitals:   06/04/18 0804 06/04/18 0826 06/04/18 0844 06/04/18 0954  BP: 114/66  125/75  Pulse: 77  81   Resp: (!) 28  (!) 24   Temp:   (!) 96.6 F (35.9 C) (!) 96.9 F (36.1 C)  TempSrc:   Axillary Rectal  SpO2:  95% 96%    Weight:      Height:        Intake/Output Summary (Last 24 hours) at 06/04/2018 1101 Last data filed at 06/04/2018 1010 Gross per 24 hour  Intake 100 ml  Output 900 ml  Net -800 ml   Filed Weights   06/02/18 0414 06/03/18 0500 06/04/18 0438  Weight: 74.5 kg 75.2 kg 73.7 kg    Examination:  Constitutional: Chronically ILL, Trach and peg in place.  Eyes: PERRL, lids and conjunctivae normal ENMT: trach in place.   Neck: normal, supple, no masses, no thyromegaly Respiratory: trach, most clear breath sounds anteriorly.  Cardiovascular: Regular rate and rhythm, no murmurs / rubs / gallops. No extremity edema. 2+ pedal pulses. No carotid bruits.  Abdomen: no tenderness, no masses palpated. No hepatosplenomegaly. Bowel sounds positive.  Musculoskeletal: no contractures.  Skin: Stage II sacral ulcer.  Neurologic: unable to fully assess.  Psychiatric: unable to assess      Data Reviewed:   CBC: Recent Labs  Lab 05/29/18 0207  05/31/18 0436 06/01/18 0348 06/02/18 0441 06/03/18 0520 06/04/18 0443  WBC 23.8*   < > 17.3* 16.8* 12.5* 11.2* 12.8*  NEUTROABS 20.1*  --   --   --   --   --   --   HGB 10.5*   < > 9.4* 9.3* 8.9* 8.8* 8.7*  HCT 31.7*   < > 29.7* 29.7* 28.5* 27.4* 27.1*  MCV 88.3   < > 90.0 92.5 90.2 89.8 90.9  PLT 363   < > 372 369 394 418* 394   < > = values in this interval not displayed.   Basic Metabolic Panel: Recent Labs  Lab 05/31/18 0436 06/01/18 0348 06/02/18 0441 06/03/18 0520 06/04/18 0443  NA 139 143 143 144 145  K 3.9 4.2 3.0* 3.1* 3.6  CL 95* 98 94* 96* 97*  CO2 31 28 33* 32 36*  GLUCOSE 189* 170* 214* 237* 274*  BUN 69* 81* 100* 121* 135*  CREATININE 1.01* 1.07* 1.26* 1.45* 1.52*  CALCIUM 8.8* 8.8* 8.9 9.0 8.9  MG 2.6* 2.6* 2.6* 2.5* 2.6*  PHOS  --   --   --   --  4.0   GFR: Estimated Creatinine Clearance: 25.5 mL/min (A) (by C-G formula based on SCr of 1.52 mg/dL (H)). Liver Function Tests: Recent Labs  Lab 05/29/18 0207 06/04/18  0443  AST 72*  --   ALT 62*  --   ALKPHOS 138*  --   BILITOT 0.8  --   PROT 7.5  --   ALBUMIN 2.4* 2.0*   No results for input(s): LIPASE, AMYLASE in the last 168 hours. No results for input(s): AMMONIA in the last 168 hours. Coagulation Profile: No results for input(s): INR, PROTIME in the last 168 hours. Cardiac Enzymes: Recent Labs  Lab 05/29/18 0207 05/29/18 0518 05/29/18 1058 05/29/18 1559  TROPONINI 0.36* 0.39* 0.33* 0.28*   BNP (last 3 results) No results for input(s): PROBNP in the last 8760 hours. HbA1C: No results for input(s): HGBA1C in the last 72 hours. CBG: Recent Labs  Lab 06/03/18 1727 06/03/18 1944 06/03/18 2341 06/04/18 0430 06/04/18 0820  GLUCAP 227* 227* 261* 265* 242*   Lipid Profile: No results for input(s): CHOL, HDL, LDLCALC, TRIG, CHOLHDL, LDLDIRECT in  the last 72 hours. Thyroid Function Tests: No results for input(s): TSH, T4TOTAL, FREET4, T3FREE, THYROIDAB in the last 72 hours. Anemia Panel: No results for input(s): VITAMINB12, FOLATE, FERRITIN, TIBC, IRON, RETICCTPCT in the last 72 hours. Sepsis Labs: Recent Labs  Lab 05/29/18 0126 05/29/18 0401 05/29/18 0745 05/30/18 0301 06/01/18 0842 06/03/18 0520  PROCALCITON  --   --  0.42 0.47 0.51 0.43  LATICACIDVEN 1.6 1.3  --   --   --   --     Recent Results (from the past 240 hour(s))  SARS Coronavirus 2 (CEPHEID - Performed in [place] hospital lab), Hosp Order     Status: None   Collection Time: 05/29/18  4:10 AM  Result Value Ref Range Status   SARS Coronavirus 2 NEGATIVE NEGATIVE Final    Comment: (NOTE) If result is NEGATIVE SARS-CoV-2 target nucleic acids are NOT DETECTED. The SARS-CoV-2 RNA is generally detectable in upper and lower  respiratory specimens during the acute phase of infection. The lowest  concentration of SARS-CoV-2 viral copies this assay can detect is 250  copies / mL. A negative result does not preclude SARS-CoV-2 infection  and should not be used as  the sole basis for treatment or other  patient management decisions.  A negative result may occur with  improper specimen collection / handling, submission of specimen other  than nasopharyngeal swab, presence of viral mutation(s) within the  areas targeted by this assay, and inadequate number of viral copies  (<250 copies / mL). A negative result must be combined with clinical  observations, patient history, and epidemiological information. If result is POSITIVE SARS-CoV-2 target nucleic acids are DETECTED. The SARS-CoV-2 RNA is generally detectable in upper and lower  respiratory specimens dur ing the acute phase of infection.  Positive  results are indicative of active infection with SARS-CoV-2.  Clinical  correlation with patient history and other diagnostic information is  necessary to determine patient infection status.  Positive results do  not rule out bacterial infection or co-infection with other viruses. If result is PRESUMPTIVE POSTIVE SARS-CoV-2 nucleic acids MAY BE PRESENT.   A presumptive positive result was obtained on the submitted specimen  and confirmed on repeat testing.  While 2019 novel coronavirus  (SARS-CoV-2) nucleic acids may be present in the submitted sample  additional confirmatory testing may be necessary for epidemiological  and / or clinical management purposes  to differentiate between  SARS-CoV-2 and other Sarbecovirus currently known to infect humans.  If clinically indicated additional testing with an alternate test  methodology [phone]) is advised. The SARS-CoV-2 RNA is generally  detectable in upper and lower respiratory sp ecimens during the acute  phase of infection. The expected result is Negative. Fact Sheet for Patients:  StrictlyIdeas.no Fact Sheet for Healthcare Providers: BankingDealers.co.za This test is not yet approved or cleared by the [place] FDA and has been authorized for  detection and/or diagnosis of SARS-CoV-2 by FDA under an Emergency Use Authorization (EUA).  This EUA will remain in effect (meaning this test can be used) for the duration of the COVID-19 declaration under Section 564(b)(1) of the Act, 21 U.S.C. section 360bbb-3(b)(1), unless the authorization is terminated or revoked sooner. Performed at [facility], [place] [address] [place], [place] [address]   MRSA PCR Screening     Status: None   Collection Time: 05/29/18 11:38 AM  Result Value Ref Range Status   MRSA by PCR NEGATIVE NEGATIVE Final    Comment:  The GeneXpert MRSA Assay (FDA approved for NASAL specimens only), is one component of a comprehensive MRSA colonization surveillance program. It is not intended to diagnose MRSA infection nor to guide or monitor treatment for MRSA infections. Performed at [facility], [place] [address], [place], [place] [address]   Culture, respiratory (non-expectorated)     Status: None   Collection Time: 05/29/18  3:40 PM  Result Value Ref Range Status   Specimen Description TRACHEAL ASPIRATE  Final   Special Requests NONE  Final   Gram Stain   Final    ABUNDANT WBC PRESENT, PREDOMINANTLY PMN NO ORGANISMS SEEN    Culture   Final    RARE Consistent with normal respiratory flora. Performed at [facility], [place] [address], [place], [place] [address]    Report Status 05/31/2018 FINAL  Final         Radiology Studies: Ir Picc Placement Right >5 Yrs Inc Img Guide  Result Date: 06/03/2018 INDICATION: CHF, systemic inflammatory response syndrome, access for medical therapy, poor peripheral veins EXAM: ULTRASOUND AND FLUOROSCOPIC GUIDED PICC LINE INSERTION MEDICATIONS: 1% lidocaine local CONTRAST:  None FLUOROSCOPY TIME:  Twelve seconds (1 mGy) COMPLICATIONS: None immediate. TECHNIQUE: The procedure, risks, benefits, and alternatives were explained to the patient's family and informed written consent was obtained. A timeout  was performed prior to the initiation of the procedure. The right upper extremity was prepped with chlorhexidine in a sterile fashion, and a sterile drape was applied covering the operative field. Maximum barrier sterile technique with sterile gowns and gloves were used for the procedure. A timeout was performed prior to the initiation of the procedure. Local anesthesia was provided with 1% lidocaine. Under direct ultrasound guidance, the right basilic vein was accessed with a micropuncture kit after the overlying soft tissues were anesthetized with 1% lidocaine. An ultrasound image was saved for documentation purposes. A guidewire was advanced to the level of the superior caval-atrial junction for measurement purposes and the PICC line was cut to length. A peel-away sheath was placed and a 39 cm, 5 [place], dual lumen was inserted to level of the superior caval-atrial junction. A post procedure spot fluoroscopic was obtained. The catheter easily aspirated and flushed and was sutured in place. A dressing was placed. The patient tolerated the procedure well without immediate post procedural complication. FINDINGS: After catheter placement, the tip lies within the superior cavoatrial junction. The catheter aspirates and flushes normally and is ready for immediate use. IMPRESSION: Successful ultrasound and fluoroscopic guided placement of a right basilic vein approach, 39 cm, 5 [place], dual lumen PICC with tip at the superior caval-atrial junction. The PICC line is ready for immediate use. Electronically Signed   By: Jerilynn Mages.  Shick M.D.   On: 06/03/2018 16:52        Scheduled Meds: . aspirin  81 mg Per Tube Daily  . budesonide (PULMICORT) nebulizer solution  0.5 mg Nebulization BID  . carvedilol  3.125 mg Per Tube BID WC  . chlorhexidine  15 mL Mouth Rinse BID  . diltiazem  30 mg Per Tube Q6H  . docusate  100 mg Per Tube BID  . enoxaparin (LOVENOX) injection  30 mg Subcutaneous Daily  . feeding supplement  (PRO-STAT SUGAR FREE 64)  30 mL Per Tube BID  . glycopyrrolate  1 mg Per Tube BID  . insulin aspart  0-9 Units Subcutaneous Q4H  . ipratropium-albuterol  3 mL Nebulization TID  . mouth rinse  15 mL Mouth Rinse  [id number]  . pantoprazole sodium  40 mg Per Tube Daily  . polyethylene glycol  17 g Per Tube BID   Continuous Infusions: . ceFEPime (MAXIPIME) IV Stopped (06/03/18 2227)  . feeding supplement (OSMOLITE 1.2 CAL) 1,000 mL (06/03/18 1506)     LOS: 7 days   Time spent= 35 mins    Carolyn Sylvia Arsenio Loader, MD [facility]  If 7PM-7AM, please contact night-coverage [email or web address] 06/04/2018, 11:01 AM

## 2018-06-04 NOTE — Progress Notes (Signed)
Temp 96.9 rectal. Will recheck in 1 hr.

## 2018-06-04 NOTE — Progress Notes (Signed)
Nutrition Follow-up  DOCUMENTATION CODES:   Not applicable  INTERVENTION:   D/c Prostat   Change tube feeding:  -Osmolite 1.2 @ 60 ml/hr via PEG (1440 ml)  Provides: 1728 kcals, 80 grams protein, 1181 ml free water. Meets 100% of needs.   NUTRITION DIAGNOSIS:   Increased nutrient needs related to wound healing as evidenced by estimated needs.  Ongoing  GOAL:   Patient will meet greater than or equal to 90% of their needs  Addressed via TF  MONITOR:   Labs, Skin, I & O's, TF tolerance  REASON FOR ASSESSMENT:   New TF    ASSESSMENT:   Patient with PMH significant for CVA (02/2018), s/p trach/PEG, A.fib, and CHF. Presents this admission with sepsis 2/2 to HCAP.    RD working remotely.  RD consulted to manage enteral feeding. Pt currently tolerating Osmolite 1.2 @ 55 ml/hr + 30 ml Prostat BID. Pt has remained on this TF regimen since her stroke in Feb 2020. Spoke with Nephrology who is concerned about daily protein intake as BUN continues to trend up. At pt's current TF rate she is receiving 103 grams of protein which equates to 1.4 g/kg. Suspect rise in BUN could be more acute. Will make TF adjustments and monitor lab values. IV steroids were d/c this am.   Weight noted to decrease from 74.9 kg on 5/28 to 73.7 kg today.   I/O: +1,551 ml since admit UOP: 900 ml x 24 hrs   Medications: colace, SS noivolog, miralax Labs: CBG 227-265 Mg 2.6 (H) BUN 134- trending up Cr 1.52- trending up  Diet Order:   Diet Order    None      EDUCATION NEEDS:   Not appropriate for education at this time  Skin:  Skin Assessment: Skin Integrity Issues: Skin Integrity Issues:: Stage II Stage II: left arm  Last BM:  6/2  Height:   Ht Readings from Last 1 Encounters:  05/29/18 5\' 3"  (1.6 m)    Weight:   Wt Readings from Last 1 Encounters:  06/04/18 73.7 kg    Ideal Body Weight:  52.3 kg  BMI:  Body mass index is 28.78 kg/m.  Estimated Nutritional Needs:   Kcal:   1600-1800 kcal  Protein:  80-100 grams  Fluid:  >/= 1.5 L/day    Vanessa Kick RD, LDN Clinical Nutrition Pager # [phone]

## 2018-06-04 NOTE — Progress Notes (Signed)
Subjective:  900 of urine recorded - BP seems fine- BUN and crt worse unfortunately - steroids stopped - still on osmolite - also s/p picc   Objective Vital signs in last 24 hours: Vitals:   06/04/18 0804 06/04/18 0826 06/04/18 0844 06/04/18 0954  BP: 114/66  125/75   Pulse: 77  81   Resp: (!) 28  (!) 24   Temp:   (!) 96.6 F (35.9 C) (!) 96.9 F (36.1 C)  TempSrc:   Axillary Rectal  SpO2:  95% 96%   Weight:      Height:       Weight change: -1.5 kg  Intake/Output Summary (Last 24 hours) at 06/04/2018 1035 Last data filed at 06/04/2018 1010 Gross per 24 hour  Intake 100 ml  Output 900 ml  Net -800 ml    Assessment/Plan: 85 year old BF recent large CVA with hemorrhagic conversion- now presents with ?PNA- during course of hospitalization has developed some AKI and worsening azotemia  1.Renal- worsening renal function and more significantly - uremia out of proportion to her AKI with reasonable UOP.  The most common causes for uremia OOP for creatinine are GIB- which she does have a little drifting down of her hgb but not major and she is on a PPI. The other causes can be steroids and she is on fairly high dose - now stopped.  Another cause can be high protein tube feeds which she is getting.   I have written a order for nutrition to look and see if can be changed to a lower protein tube feed.  The final reason is if pt is volume depleted.  I do not think that she is based on her  CXR and I's and O's.  Hopefully this will start to turn around because she is not a candidate for dialysis due to her devastating neurologic diagnosis without improvement since February.  Cont to watch  2. Hypertension/volume  - BP is fine, if anything she is likely volume overloaded , however, would be hesitant right now to diurese as this will make her BUN go even higher.  I think she is likely third spacing given her nutritional status 3. Anemia  - is low and drifting down.  Conservative supportive care for now-  transfuse PRN  4. Hypokalemia- given repletion thru tube since IV access is problematic - better today    Louis Meckel    Labs: Basic Metabolic Panel: Recent Labs  Lab 06/02/18 0441 06/03/18 0520 06/04/18 0443  NA 143 144 145  K 3.0* 3.1* 3.6  CL 94* 96* 97*  CO2 33* 32 36*  GLUCOSE 214* 237* 274*  BUN 100* 121* 135*  CREATININE 1.26* 1.45* 1.52*  CALCIUM 8.9 9.0 8.9  PHOS  --   --  4.0   Liver Function Tests: Recent Labs  Lab 05/29/18 0207 06/04/18 0443  AST 72*  --   ALT 62*  --   ALKPHOS 138*  --   BILITOT 0.8  --   PROT 7.5  --   ALBUMIN 2.4* 2.0*   No results for input(s): LIPASE, AMYLASE in the last 168 hours. No results for input(s): AMMONIA in the last 168 hours. CBC: Recent Labs  Lab 05/29/18 0207  05/31/18 0436 06/01/18 0348 06/02/18 0441 06/03/18 0520 06/04/18 0443  WBC 23.8*   < > 17.3* 16.8* 12.5* 11.2* 12.8*  NEUTROABS 20.1*  --   --   --   --   --   --  HGB 10.5*   < > 9.4* 9.3* 8.9* 8.8* 8.7*  HCT 31.7*   < > 29.7* 29.7* 28.5* 27.4* 27.1*  MCV 88.3   < > 90.0 92.5 90.2 89.8 90.9  PLT 363   < > 372 369 394 418* 394   < > = values in this interval not displayed.   Cardiac Enzymes: Recent Labs  Lab 05/29/18 0207 05/29/18 0518 05/29/18 1058 05/29/18 1559  TROPONINI 0.36* 0.39* 0.33* 0.28*   CBG: Recent Labs  Lab 06/03/18 1727 06/03/18 1944 06/03/18 2341 06/04/18 0430 06/04/18 0820  GLUCAP 227* 227* 261* 265* 242*    Iron Studies: No results for input(s): IRON, TIBC, TRANSFERRIN, FERRITIN in the last 72 hours. Studies/Results: Ir Picc Placement Right >5 Yrs Inc Img Guide  Result Date: 06/03/2018 INDICATION: CHF, systemic inflammatory response syndrome, access for medical therapy, poor peripheral veins EXAM: ULTRASOUND AND FLUOROSCOPIC GUIDED PICC LINE INSERTION MEDICATIONS: 1% lidocaine local CONTRAST:  None FLUOROSCOPY TIME:  Twelve seconds (1 mGy) COMPLICATIONS: None immediate. TECHNIQUE: The procedure, risks,  benefits, and alternatives were explained to the patient's family and informed written consent was obtained. A timeout was performed prior to the initiation of the procedure. The right upper extremity was prepped with chlorhexidine in a sterile fashion, and a sterile drape was applied covering the operative field. Maximum barrier sterile technique with sterile gowns and gloves were used for the procedure. A timeout was performed prior to the initiation of the procedure. Local anesthesia was provided with 1% lidocaine. Under direct ultrasound guidance, the right basilic vein was accessed with a micropuncture kit after the overlying soft tissues were anesthetized with 1% lidocaine. An ultrasound image was saved for documentation purposes. A guidewire was advanced to the level of the superior caval-atrial junction for measurement purposes and the PICC line was cut to length. A peel-away sheath was placed and a 39 cm, 5 [place], dual lumen was inserted to level of the superior caval-atrial junction. A post procedure spot fluoroscopic was obtained. The catheter easily aspirated and flushed and was sutured in place. A dressing was placed. The patient tolerated the procedure well without immediate post procedural complication. FINDINGS: After catheter placement, the tip lies within the superior cavoatrial junction. The catheter aspirates and flushes normally and is ready for immediate use. IMPRESSION: Successful ultrasound and fluoroscopic guided placement of a right basilic vein approach, 39 cm, 5 French, dual lumen PICC with tip at the superior caval-atrial junction. The PICC line is ready for immediate use. Electronically Signed   By: Jerilynn Mages.  Shick M.D.   On: 06/03/2018 16:52   Medications: Infusions: . ceFEPime (MAXIPIME) IV Stopped (06/03/18 2227)  . feeding supplement (OSMOLITE 1.2 CAL) 1,000 mL (06/03/18 1506)    Scheduled Medications: . aspirin  81 mg Per Tube Daily  . budesonide (PULMICORT) nebulizer solution   0.5 mg Nebulization BID  . carvedilol  3.125 mg Per Tube BID WC  . chlorhexidine  15 mL Mouth Rinse BID  . diltiazem  30 mg Per Tube Q6H  . docusate  100 mg Per Tube BID  . enoxaparin (LOVENOX) injection  30 mg Subcutaneous Daily  . feeding supplement (PRO-STAT SUGAR FREE 64)  30 mL Per Tube BID  . glycopyrrolate  1 mg Per Tube BID  . insulin aspart  0-9 Units Subcutaneous Q4H  . ipratropium-albuterol  3 mL Nebulization TID  . mouth rinse  15 mL Mouth Rinse q12n4p  . pantoprazole sodium  40 mg Per Tube Daily  . polyethylene glycol  17 g Per Tube BID    have reviewed scheduled and prn medications.  Physical Exam: General: more alert today for unclear reasons Heart: RRR Lungs: clear Abdomen: soft, non tender Extremities: pitting edema     06/04/2018,10:35 AM  LOS: 7 days

## 2018-06-05 LAB — RENAL FUNCTION PANEL
Albumin: 2.1 g/dL — ABNORMAL LOW (ref 3.5–5.0)
Anion gap: 11 (ref 5–15)
BUN: 128 mg/dL — ABNORMAL HIGH (ref 8–23)
CO2: 36 mmol/L — ABNORMAL HIGH (ref 22–32)
Calcium: 8.8 mg/dL — ABNORMAL LOW (ref 8.9–10.3)
Chloride: 100 mmol/L (ref 98–111)
Creatinine, Ser: 1.44 mg/dL — ABNORMAL HIGH (ref 0.44–1.00)
GFR calc Af Amer: 38 mL/min — ABNORMAL LOW (ref >60)
GFR calc non Af Amer: 33 mL/min — ABNORMAL LOW (ref >60)
Glucose, Bld: 144 mg/dL — ABNORMAL HIGH (ref 70–99)
Phosphorus: 3.6 mg/dL (ref 2.5–4.6)
Potassium: 3.8 mmol/L (ref 3.5–5.1)
Sodium: 147 mmol/L — ABNORMAL HIGH (ref 135–145)

## 2018-06-05 LAB — CBC
HCT: 29.8 % — ABNORMAL LOW (ref 36.0–46.0)
Hemoglobin: 9.1 g/dL — ABNORMAL LOW (ref 12.0–15.0)
MCH: 28.5 pg (ref 26.0–34.0)
MCHC: 30.5 g/dL (ref 30.0–36.0)
MCV: 93.4 fL (ref 80.0–100.0)
Platelets: 427 10*3/uL — ABNORMAL HIGH (ref 150–400)
RBC: 3.19 MIL/uL — ABNORMAL LOW (ref 3.87–5.11)
RDW: 17.9 % — ABNORMAL HIGH (ref 11.5–15.5)
WBC: 15 10*3/uL — ABNORMAL HIGH (ref 4.0–10.5)
nRBC: 0 % (ref 0.0–0.2)

## 2018-06-05 LAB — GLUCOSE, CAPILLARY
Glucose-Capillary: 129 mg/dL — ABNORMAL HIGH (ref 70–99)
Glucose-Capillary: 142 mg/dL — ABNORMAL HIGH (ref 70–99)
Glucose-Capillary: 151 mg/dL — ABNORMAL HIGH (ref 70–99)
Glucose-Capillary: 169 mg/dL — ABNORMAL HIGH (ref 70–99)
Glucose-Capillary: 184 mg/dL — ABNORMAL HIGH (ref 70–99)
Glucose-Capillary: 189 mg/dL — ABNORMAL HIGH (ref 70–99)

## 2018-06-05 LAB — MAGNESIUM: Magnesium: 2.7 mg/dL — ABNORMAL HIGH (ref 1.7–2.4)

## 2018-06-05 MED ORDER — FREE WATER
200.0000 mL | Freq: Three times a day (TID) | Status: DC
  Administered 2018-06-05 – 2018-06-11 (×19): 200 mL

## 2018-06-05 NOTE — Progress Notes (Signed)
Subjective:  650 of urine recorded - BP seems fine- BUN and crt finally trending down a little - prostat stopped yest   Objective Vital signs in last 24 hours: Vitals:   06/05/18 0400 06/05/18 0800 06/05/18 0858 06/05/18 0901  BP: (!) 143/67 129/73  129/73  Pulse: 75 85  88  Resp: (!) 32 (!) 34  (!) 34  Temp:      TempSrc:      SpO2: 99% 99% 95% 94%  Weight:      Height:       Weight change: -1.1 kg  Intake/Output Summary (Last 24 hours) at 06/05/2018 1016 Last data filed at 06/05/2018 0900 Gross per 24 hour  Intake 0 ml  Output 650 ml  Net -650 ml    Assessment/Plan: 85 year old BF recent large CVA with hemorrhagic conversion- now presents with ?PNA- during course of hospitalization has developed some AKI and worsening azotemia  1.Renal- worsening renal function and more significantly - uremia out of proportion to her AKI with reasonable UOP.  The most common causes for uremia OOP for creatinine are GIB-she doesn't have. The other causes can be steroids and she was on fairly high dose - now stopped.  Another cause can be high protein tube feeds which she is getting.  Nutrition has changed to a lower protein intake overall.  The final reason is if pt is volume depleted.  I do not think that she is based on her  CXR and I's and O's.  Hopefully this has now started and will continue to turn around because she is not a candidate for dialysis due to her devastating neurologic diagnosis without improvement since February.  Slightly better today, not sure why UOP dropped off some   2. Hypertension/volume  - BP is fine, if anything she is likely volume overloaded , however, would be hesitant right now to diurese as this will make her BUN go even higher.  I think she is likely third spacing given her nutritional status- needs a little free water for sodium  - weight is trending down so no lasix for now  3. Anemia  - is low and drifting down.  Conservative supportive care for now- transfuse PRN  4.  Hypokalemia- given repletion thru tube since IV access is problematic - better today  5. Hyponatremia- will add some free water to tube feeds   [place]: Basic Metabolic Panel: Recent Labs  Lab 06/03/18 0520 06/04/18 0443 06/05/18 0805  NA 144 145 147*  K 3.1* 3.6 3.8  CL 96* 97* 100  CO2 32 36* 36*  GLUCOSE 237* 274* 144*  BUN 121* 135* 128*  CREATININE 1.45* 1.52* 1.44*  CALCIUM 9.0 8.9 8.8*  PHOS  --  4.0 3.6   Liver Function Tests: Recent Labs  Lab 06/04/18 0443 06/05/18 0805  ALBUMIN 2.0* 2.1*   No results for input(s): LIPASE, AMYLASE in the last 168 hours. No results for input(s): AMMONIA in the last 168 hours. CBC: Recent Labs  Lab 06/01/18 0348 06/02/18 0441 06/03/18 0520 06/04/18 0443 06/05/18 0804  WBC 16.8* 12.5* 11.2* 12.8* 15.0*  HGB 9.3* 8.9* 8.8* 8.7* 9.1*  HCT 29.7* 28.5* 27.4* 27.1* 29.8*  MCV 92.5 90.2 89.8 90.9 93.4  PLT 369 394 418* 394 427*   Cardiac Enzymes: Recent Labs  Lab 05/29/18 1058 05/29/18 1559  TROPONINI 0.33* 0.28*   CBG: Recent Labs  Lab 06/04/18 1648 06/04/18 2014 06/05/18 0002 06/05/18 0350 06/05/18  0810  GLUCAP 216* 174* 169* 184* 129*    Iron Studies: No results for input(s): IRON, TIBC, TRANSFERRIN, FERRITIN in the last 72 hours. Studies/Results: Ir Picc Placement Right >5 Yrs Inc Img Guide  Result Date: 06/03/2018 INDICATION: CHF, systemic inflammatory response syndrome, access for medical therapy, poor peripheral veins EXAM: ULTRASOUND AND FLUOROSCOPIC GUIDED PICC LINE INSERTION MEDICATIONS: 1% lidocaine local CONTRAST:  None FLUOROSCOPY TIME:  Twelve seconds (1 mGy) COMPLICATIONS: None immediate. TECHNIQUE: The procedure, risks, benefits, and alternatives were explained to the patient's family and informed written consent was obtained. A timeout was performed prior to the initiation of the procedure. The right upper extremity was prepped with chlorhexidine in a sterile fashion, and a  sterile drape was applied covering the operative field. Maximum barrier sterile technique with sterile gowns and gloves were used for the procedure. A timeout was performed prior to the initiation of the procedure. Local anesthesia was provided with 1% lidocaine. Under direct ultrasound guidance, the right basilic vein was accessed with a micropuncture kit after the overlying soft tissues were anesthetized with 1% lidocaine. An ultrasound image was saved for documentation purposes. A guidewire was advanced to the level of the superior caval-atrial junction for measurement purposes and the PICC line was cut to length. A peel-away sheath was placed and a 39 cm, 5 [place], dual lumen was inserted to level of the superior caval-atrial junction. A post procedure spot fluoroscopic was obtained. The catheter easily aspirated and flushed and was sutured in place. A dressing was placed. The patient tolerated the procedure well without immediate post procedural complication. FINDINGS: After catheter placement, the tip lies within the superior cavoatrial junction. The catheter aspirates and flushes normally and is ready for immediate use. IMPRESSION: Successful ultrasound and fluoroscopic guided placement of a right basilic vein approach, 39 cm, 5 [place], dual lumen PICC with tip at the superior caval-atrial junction. The PICC line is ready for immediate use. Electronically Signed   By: Jerilynn Mages.  Shick M.D.   On: 06/03/2018 16:52   Medications: Infusions: . ceFEPime (MAXIPIME) IV 2 g (06/04/18 2045)  . feeding supplement (OSMOLITE 1.2 CAL) 1,000 mL (06/04/18 1400)    Scheduled Medications: . aspirin  81 mg Per Tube Daily  . budesonide (PULMICORT) nebulizer solution  0.5 mg Nebulization BID  . carvedilol  3.125 mg Per Tube BID WC  . chlorhexidine  15 mL Mouth Rinse BID  . diltiazem  30 mg Per Tube Q6H  . docusate  100 mg Per Tube BID  . enoxaparin (LOVENOX) injection  30 mg Subcutaneous Daily  . glycopyrrolate  1 mg  Per Tube BID  . insulin aspart  0-9 Units Subcutaneous Q4H  . insulin aspart  3 Units Subcutaneous Q4H  . mouth rinse  15 mL Mouth Rinse q12n4p  . pantoprazole sodium  40 mg Per Tube Daily  . polyethylene glycol  17 g Per Tube BID    have reviewed scheduled and prn medications.  Physical Exam: General: more alert today for unclear reasons Heart: RRR Lungs: clear Abdomen: soft, non tender Extremities: pitting edema     06/05/2018,10:16 AM  LOS: 8 days

## 2018-06-05 NOTE — Progress Notes (Signed)
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 fib, diastolic CHF presents the ER with complaints of fevers and shortness of breath.  Patient was found to be febrile and elevated WBC of 23.  SHe was also having higher oxygen requirement with 10 L trach collar.  Suspicion for pneumonia versus bronchial tracheitis therefore initially started on broad-spectrum antibiotic given previous history of Pseudomonas.  Tracheal aspirate was sent.  Pulmonary was consulted.  COVIDnegative. Due to worsening BUN/Cr ratio, nephro consulted.    Assessment & Plan:   Principal Problem:   SIRS (systemic inflammatory response syndrome) (HCC) Active Problems:   Status post tracheostomy ([place])   Status post insertion of percutaneous endoscopic gastrostomy (PEG) tube (HCC)   Atrial fibrillation, chronic   Chronic diastolic (congestive) heart failure (HCC)   History of completed stroke   Pressure ulcer, stage 2 (HCC)   Uremia due to inadequate renal perfusion  Sepsis secondary to healthcare acquired pneumonia; POA Acute on chronic respiratory failure with hypoxia, improved  -Chronically on Trach Collar -Incentive spirometry and flutter valve.  Bronchodilators scheduled and PRN.  Unable to diurese more as its causing intravascular dehydration and rise on BUN/Cr.  -Procalcitonin 0.47, follow-up procalcitonin.  Hopefully we can discontinue antibiotics within the next 48 hours.  Secretions improved.Marland Kitchen glycopyrolate to help with secretions.  -Repeat chest x-ray 5/29-stable left-sided basilar opacity concerning for pneumonia versus atelectasis with associated effusion.  -Continue bronchodilators.  Holding home diuretics due to signs of some intravascular volume depletion.  Hold IV diuretics as well.  Goals of care discussion with the patient's  daughter on 5/31.  Refer to their note for details appreciate palliative care team input  Uremia with mild acute kidney injury -Baseline creatinine 1.0.  BUN elevated at 128.  Slowly appears to be improving.  We will continue to trend this.  Slight more hydration with her tube feeding.  History of hemorrhagic CVA -On aspirin.  History of atrial flutter/fibrillation - Continue home regimen of Cardizem and Coreg.  Not on anticoagulation due to hemorrhagic CVA.  Chronic diastolic congestive heart failure -Resume her home regimen of torsemide  Anemia of chronic disease -Hemoglobin appears to be stable.  Continue to monitor this.  Stage II noninfected sacral pressure ulcer present prior to admission  Due to difficult IV Access and need for medications, PICC placed 6/1  DVT prophylaxis: Lovenox full code Code Status: Full code for now.  Family Communication: None today Disposition Plan: Maintain hospital stay until renal function improves  Consultants:   Pulmonary  Nephrology  Palliative Care  Procedures:   PICC placed 6/1  Antimicrobials:   Cefepime   Subjective: Good urine output overnight.  Difficult to measure as patient is quite incontinent.  Appears a bit more awake today and tracking me across the room.  Follows very basic commands.  Review of Systems Otherwise negative except as per HPI, including: Unable to obtain.   Objective: Vitals:   06/05/18 0858 06/05/18 0901 06/05/18 1200 06/05/18 1205  BP:  129/73 120/67 120/67  Pulse:  88 77 90  Resp:  (!) 34 (!) 33 (!) 29  Temp:   98.2 F (36.8 C)   TempSrc:   Oral   SpO2: 95% 94% 98% 100%  Weight:      Height:        Intake/Output Summary (Last  24 hours) at 06/05/2018 1255 Last data filed at 06/05/2018 0900 Gross per 24 hour  Intake 0 ml  Output 650 ml  Net -650 ml   Filed Weights   06/03/18 0500 06/04/18 0438 06/05/18 0339  Weight: 75.2 kg 73.7 kg 72.6 kg    Examination:  Constitutional: NAD,  calm, comfortable, trach and PEG in place Eyes: PERRL, lids and conjunctivae normal ENMT: Mucous membranes are moist. Posterior pharynx clear of any exudate or lesions.Normal dentition.  Neck: normal, supple, no masses, no thyromegaly Respiratory: Very mild anterior coarse breath sound. Cardiovascular: Regular rate and rhythm, no murmurs / rubs / gallops. No extremity edema. 2+ pedal pulses. No carotid bruits.  Abdomen: no tenderness, no masses palpated. No hepatosplenomegaly. Bowel sounds positive.  PEG tube noted Musculoskeletal: No contractures Skin: no rashes, lesions, ulcers. No induration Neurologic: Difficult to assess full neurologic exam but appears to be more awake and alert.  Tracking me across the room.  Follows very basic questions Psychiatric: Poor judgment and insight     Data Reviewed:   CBC: Recent Labs  Lab 06/01/18 0348 06/02/18 0441 06/03/18 0520 06/04/18 0443 06/05/18 0804  WBC 16.8* 12.5* 11.2* 12.8* 15.0*  HGB 9.3* 8.9* 8.8* 8.7* 9.1*  HCT 29.7* 28.5* 27.4* 27.1* 29.8*  MCV 92.5 90.2 89.8 90.9 93.4  PLT 369 394 418* 394 604*   Basic Metabolic Panel: Recent Labs  Lab 06/01/18 0348 06/02/18 0441 06/03/18 0520 06/04/18 0443 06/05/18 0805  NA 143 143 144 145 147*  K 4.2 3.0* 3.1* 3.6 3.8  CL 98 94* 96* 97* 100  CO2 28 33* 32 36* 36*  GLUCOSE 170* 214* 237* 274* 144*  BUN 81* 100* 121* 135* 128*  CREATININE 1.07* 1.26* 1.45* 1.52* 1.44*  CALCIUM 8.8* 8.9 9.0 8.9 8.8*  MG 2.6* 2.6* 2.5* 2.6* 2.7*  PHOS  --   --   --  4.0 3.6   GFR: Estimated Creatinine Clearance: 26.8 mL/min (A) (by C-G formula based on SCr of 1.44 mg/dL (H)). Liver Function Tests: Recent Labs  Lab 06/04/18 0443 06/05/18 0805  ALBUMIN 2.0* 2.1*   No results for input(s): LIPASE, AMYLASE in the last 168 hours. No results for input(s): AMMONIA in the last 168 hours. Coagulation Profile: No results for input(s): INR, PROTIME in the last 168 hours. Cardiac Enzymes: Recent  Labs  Lab 05/29/18 1559  TROPONINI 0.28*   BNP (last 3 results) No results for input(s): PROBNP in the last 8760 hours. HbA1C: No results for input(s): HGBA1C in the last 72 hours. CBG: Recent Labs  Lab 06/04/18 2014 06/05/18 0002 06/05/18 0350 06/05/18 0810 06/05/18 1203  GLUCAP 174* 169* 184* 129* 142*   Lipid Profile: No results for input(s): CHOL, HDL, LDLCALC, TRIG, CHOLHDL, LDLDIRECT in the last 72 hours. Thyroid Function Tests: No results for input(s): TSH, T4TOTAL, FREET4, T3FREE, THYROIDAB in the last 72 hours. Anemia Panel: No results for input(s): VITAMINB12, FOLATE, FERRITIN, TIBC, IRON, RETICCTPCT in the last 72 hours. Sepsis Labs: Recent Labs  Lab 05/30/18 0301 06/01/18 0842 06/03/18 0520  PROCALCITON 0.47 0.51 0.43    Recent Results (from the past 240 hour(s))  SARS Coronavirus 2 (CEPHEID - Performed in [facility] hospital lab), Hosp Order     Status: None   Collection Time: 05/29/18  4:10 AM  Result Value Ref Range Status   SARS Coronavirus 2 NEGATIVE NEGATIVE Final    Comment: (NOTE) If result is NEGATIVE SARS-CoV-2 target nucleic acids are NOT DETECTED. The SARS-CoV-2 RNA is  generally detectable in upper and lower  respiratory specimens during the acute phase of infection. The lowest  concentration of SARS-CoV-2 viral copies this assay can detect is 250  copies / mL. A negative result does not preclude SARS-CoV-2 infection  and should not be used as the sole basis for treatment or other  patient management decisions.  A negative result may occur with  improper specimen collection / handling, submission of specimen other  than nasopharyngeal swab, presence of viral mutation(s) within the  areas targeted by this assay, and inadequate number of viral copies  (<250 copies / mL). A negative result must be combined with clinical  observations, patient history, and epidemiological information. If result is POSITIVE SARS-CoV-2 target nucleic acids are  DETECTED. The SARS-CoV-2 RNA is generally detectable in upper and lower  respiratory specimens dur ing the acute phase of infection.  Positive  results are indicative of active infection with SARS-CoV-2.  Clinical  correlation with patient history and other diagnostic information is  necessary to determine patient infection status.  Positive results do  not rule out bacterial infection or co-infection with other viruses. If result is PRESUMPTIVE POSTIVE SARS-CoV-2 nucleic acids MAY BE PRESENT.   A presumptive positive result was obtained on the submitted specimen  and confirmed on repeat testing.  While 2019 novel coronavirus  (SARS-CoV-2) nucleic acids may be present in the submitted sample  additional confirmatory testing may be necessary for epidemiological  and / or clinical management purposes  to differentiate between  SARS-CoV-2 and other Sarbecovirus currently known to infect humans.  If clinically indicated additional testing with an alternate test  methodology [phone]) is advised. The SARS-CoV-2 RNA is generally  detectable in upper and lower respiratory sp ecimens during the acute  phase of infection. The expected result is Negative. Fact Sheet for Patients:  StrictlyIdeas.no Fact Sheet for Healthcare Providers: BankingDealers.co.za This test is not yet approved or cleared by the [place] FDA and has been authorized for detection and/or diagnosis of SARS-CoV-2 by FDA under an Emergency Use Authorization (EUA).  This EUA will remain in effect (meaning this test can be used) for the duration of the COVID-19 declaration under Section 564(b)(1) of the Act, 21 U.S.C. section 360bbb-3(b)(1), unless the authorization is terminated or revoked sooner. Performed at [facility], [place] [address], [place], [place] [address]   MRSA PCR Screening     Status: None   Collection Time: 05/29/18 11:38 AM  Result Value Ref Range  Status   MRSA by PCR NEGATIVE NEGATIVE Final    Comment:        The GeneXpert MRSA Assay (FDA approved for NASAL specimens only), is one component of a comprehensive MRSA colonization surveillance program. It is not intended to diagnose MRSA infection nor to guide or monitor treatment for MRSA infections. Performed at [facility], [place] [address], [place], [place] [address]   Culture, respiratory (non-expectorated)     Status: None   Collection Time: 05/29/18  3:40 PM  Result Value Ref Range Status   Specimen Description TRACHEAL ASPIRATE  Final   Special Requests NONE  Final   Gram Stain   Final    ABUNDANT WBC PRESENT, PREDOMINANTLY PMN NO ORGANISMS SEEN    Culture   Final    RARE Consistent with normal respiratory flora. Performed at [facility], [place] [address], [place],  [address]    Report Status 05/31/2018 FINAL  Final  Radiology Studies: Ir Picc Placement Right >5 Yrs Inc Img Guide  Result Date: 06/03/2018 INDICATION: CHF, systemic inflammatory response syndrome, access for medical therapy, poor peripheral veins EXAM: ULTRASOUND AND FLUOROSCOPIC GUIDED PICC LINE INSERTION MEDICATIONS: 1% lidocaine local CONTRAST:  None FLUOROSCOPY TIME:  Twelve seconds (1 mGy) COMPLICATIONS: None immediate. TECHNIQUE: The procedure, risks, benefits, and alternatives were explained to the patient's family and informed written consent was obtained. A timeout was performed prior to the initiation of the procedure. The right upper extremity was prepped with chlorhexidine in a sterile fashion, and a sterile drape was applied covering the operative field. Maximum barrier sterile technique with sterile gowns and gloves were used for the procedure. A timeout was performed prior to the initiation of the procedure. Local anesthesia was provided with 1% lidocaine. Under direct ultrasound guidance, the right basilic vein was accessed with a micropuncture kit after the  overlying soft tissues were anesthetized with 1% lidocaine. An ultrasound image was saved for documentation purposes. A guidewire was advanced to the level of the superior caval-atrial junction for measurement purposes and the PICC line was cut to length. A peel-away sheath was placed and a 39 cm, 5 [place], dual lumen was inserted to level of the superior caval-atrial junction. A post procedure spot fluoroscopic was obtained. The catheter easily aspirated and flushed and was sutured in place. A dressing was placed. The patient tolerated the procedure well without immediate post procedural complication. FINDINGS: After catheter placement, the tip lies within the superior cavoatrial junction. The catheter aspirates and flushes normally and is ready for immediate use. IMPRESSION: Successful ultrasound and fluoroscopic guided placement of a right basilic vein approach, 39 cm, 5 [place], dual lumen PICC with tip at the superior caval-atrial junction. The PICC line is ready for immediate use. Electronically Signed   By: Jerilynn Mages.  Shick M.D.   On: 06/03/2018 16:52        Scheduled Meds: . aspirin  81 mg Per Tube Daily  . budesonide (PULMICORT) nebulizer solution  0.5 mg Nebulization BID  . carvedilol  3.125 mg Per Tube BID WC  . chlorhexidine  15 mL Mouth Rinse BID  . diltiazem  30 mg Per Tube Q6H  . docusate  100 mg Per Tube BID  . enoxaparin (LOVENOX) injection  30 mg Subcutaneous Daily  . free water  200 mL Per Tube Q8H  . glycopyrrolate  1 mg Per Tube BID  . insulin aspart  0-9 Units Subcutaneous Q4H  . insulin aspart  3 Units Subcutaneous Q4H  . mouth rinse  15 mL Mouth Rinse q12n4p  . pantoprazole sodium  40 mg Per Tube Daily  . polyethylene glycol  17 g Per Tube BID   Continuous Infusions: . ceFEPime (MAXIPIME) IV 2 g (06/04/18 2045)  . feeding supplement (OSMOLITE 1.2 CAL) 1,000 mL (06/04/18 1400)     LOS: 8 days   Time spent= 35 mins    Ankit Arsenio Loader, MD [facility]  If  7PM-7AM, please contact night-coverage [email or web address] 06/05/2018, 12:55 PM

## 2018-06-06 LAB — GLUCOSE, CAPILLARY
Glucose-Capillary: 141 mg/dL — ABNORMAL HIGH (ref 70–99)
Glucose-Capillary: 148 mg/dL — ABNORMAL HIGH (ref 70–99)
Glucose-Capillary: 172 mg/dL — ABNORMAL HIGH (ref 70–99)
Glucose-Capillary: 196 mg/dL — ABNORMAL HIGH (ref 70–99)
Glucose-Capillary: 209 mg/dL — ABNORMAL HIGH (ref 70–99)
Glucose-Capillary: 93 mg/dL (ref 70–99)

## 2018-06-06 LAB — MAGNESIUM: Magnesium: 2.7 mg/dL — ABNORMAL HIGH (ref 1.7–2.4)

## 2018-06-06 LAB — RENAL FUNCTION PANEL
Albumin: 2.1 g/dL — ABNORMAL LOW (ref 3.5–5.0)
Anion gap: 11 (ref 5–15)
BUN: 109 mg/dL — ABNORMAL HIGH (ref 8–23)
CO2: 35 mmol/L — ABNORMAL HIGH (ref 22–32)
Calcium: 8.7 mg/dL — ABNORMAL LOW (ref 8.9–10.3)
Chloride: 100 mmol/L (ref 98–111)
Creatinine, Ser: 1.33 mg/dL — ABNORMAL HIGH (ref 0.44–1.00)
GFR calc Af Amer: 42 mL/min — ABNORMAL LOW (ref >60)
GFR calc non Af Amer: 36 mL/min — ABNORMAL LOW (ref >60)
Glucose, Bld: 215 mg/dL — ABNORMAL HIGH (ref 70–99)
Phosphorus: 4.4 mg/dL (ref 2.5–4.6)
Potassium: 4.2 mmol/L (ref 3.5–5.1)
Sodium: 146 mmol/L — ABNORMAL HIGH (ref 135–145)

## 2018-06-06 LAB — CBC
HCT: 30.1 % — ABNORMAL LOW (ref 36.0–46.0)
Hemoglobin: 9.2 g/dL — ABNORMAL LOW (ref 12.0–15.0)
MCH: 28.2 pg (ref 26.0–34.0)
MCHC: 30.6 g/dL (ref 30.0–36.0)
MCV: 92.3 fL (ref 80.0–100.0)
Platelets: 434 10*3/uL — ABNORMAL HIGH (ref 150–400)
RBC: 3.26 MIL/uL — ABNORMAL LOW (ref 3.87–5.11)
RDW: 18.4 % — ABNORMAL HIGH (ref 11.5–15.5)
WBC: 14.8 10*3/uL — ABNORMAL HIGH (ref 4.0–10.5)
nRBC: 0 % (ref 0.0–0.2)

## 2018-06-06 LAB — PROCALCITONIN: Procalcitonin: 0.15 ng/mL

## 2018-06-06 NOTE — Progress Notes (Signed)
Subjective:  1100 of urine recorded - BP seems fine- BUN and crt finally trending down again  Objective Vital signs in last 24 hours: Vitals:   06/06/18 0401 06/06/18 0402 06/06/18 0814 06/06/18 0913  BP: (!) 145/122 116/62 124/84 124/84  Pulse: 95 (!) 136 95 (!) 105  Resp: (!) 32 (!) 29 (!) 26 (!) 28  Temp: 97.9 F (36.6 C)  98.3 F (36.8 C)   TempSrc: Oral  Oral   SpO2: 100% 100% 96% 98%  Weight: 74.4 kg     Height:       Weight change: 1.8 kg  Intake/Output Summary (Last 24 hours) at 06/06/2018 1014 Last data filed at 06/06/2018 0400 Gross per 24 hour  Intake 0 ml  Output 1100 ml  Net -1100 ml    Assessment/Plan: 85 year old BF recent large CVA with hemorrhagic conversion- now presents with ?PNA- during course of hospitalization has developed some AKI and worsening azotemia  1.Renal- worsening renal function and more significantly - uremia out of proportion to her AKI with reasonable UOP.  The most common causes for uremia OOP for creatinine are GIB-she doesn't have --- steroids -  now stopped.  High protein tube feeds which she is getting but needs, made modifications.  The final reason is if pt is volume depleted.  I do not think that she is based on her  CXR and I's and O's. Has now started and hopefully will continue to trend down because she is not a candidate for dialysis due to her devastating neurologic diagnosis without improvement since February.  Trending better now 2 days in a row, I have no further suggestions, will sign off- call with questions 2. Hypertension/volume  - BP is fine, if anything she is likely volume overloaded , however, would be hesitant right now to diurese as this will make her BUN go even higher.  I think she is likely third spacing given her nutritional status- needs a little free water for sodium  -  3. Anemia  - is low and drifting down.  Conservative supportive care for now- transfuse PRN  4. Hypokalemia- given repletion thru tube since IV access is  problematic - better  5. Hyponatremia- have added some free water to tube feeds- slightly better- can continue to titrate to get sodium to goal   Renal will sign off- call with questions   Cecille AverKellie A [place] Hollar    Labs: Basic Metabolic Panel: Recent Labs  Lab 06/04/18 0443 06/05/18 0805 06/06/18 0725  NA 145 147* 146*  K 3.6 3.8 4.2  CL 97* 100 100  CO2 36* 36* 35*  GLUCOSE 274* 144* 215*  BUN 135* 128* 109*  CREATININE 1.52* 1.44* 1.33*  CALCIUM 8.9 8.8* 8.7*  PHOS 4.0 3.6 4.4   Liver Function Tests: Recent Labs  Lab 06/04/18 0443 06/05/18 0805 06/06/18 0725  ALBUMIN 2.0* 2.1* 2.1*   No results for input(s): LIPASE, AMYLASE in the last 168 hours. No results for input(s): AMMONIA in the last 168 hours. CBC: Recent Labs  Lab 06/02/18 0441 06/03/18 0520 06/04/18 0443 06/05/18 0804 06/06/18 0725  WBC 12.5* 11.2* 12.8* 15.0* 14.8*  HGB 8.9* 8.8* 8.7* 9.1* 9.2*  HCT 28.5* 27.4* 27.1* 29.8* 30.1*  MCV 90.2 89.8 90.9 93.4 92.3  PLT 394 418* 394 427* 434*   Cardiac Enzymes: No results for input(s): CKTOTAL, CKMB, CKMBINDEX, TROPONINI in the last 168 hours. CBG: Recent Labs  Lab 06/05/18 1556 06/05/18 2038 06/06/18 0006 06/06/18 0405 06/06/18 [id number]  GLUCAP 151* 189* 148* 93 209*    Iron Studies: No results for input(s): IRON, TIBC, TRANSFERRIN, FERRITIN in the last 72 hours. Studies/Results: No results found. Medications: Infusions: . ceFEPime (MAXIPIME) IV 2 g (06/05/18 2336)  . feeding supplement (OSMOLITE 1.2 CAL) 1,000 mL (06/05/18 2339)    Scheduled Medications: . aspirin  81 mg Per Tube Daily  . budesonide (PULMICORT) nebulizer solution  0.5 mg Nebulization BID  . carvedilol  3.125 mg Per Tube BID WC  . chlorhexidine  15 mL Mouth Rinse BID  . diltiazem  30 mg Per Tube Q6H  . docusate  100 mg Per Tube BID  . enoxaparin (LOVENOX) injection  30 mg Subcutaneous Daily  . free water  200 mL Per Tube Q8H  . glycopyrrolate  1 mg Per Tube BID  .  insulin aspart  0-9 Units Subcutaneous Q4H  . insulin aspart  3 Units Subcutaneous Q4H  . mouth rinse  15 mL Mouth Rinse q12n4p  . pantoprazole sodium  40 mg Per Tube Daily  . polyethylene glycol  17 g Per Tube BID    have reviewed scheduled and prn medications.  Physical Exam: General: some alert today - no commands  Heart: RRR Lungs: clear Abdomen: soft, non tender Extremities: pitting edema     06/06/2018,10:14 AM  LOS: 9 days

## 2018-06-06 NOTE — TOC Progression Note (Signed)
Transition of Care [facility]) - Progression Note    Patient Details  Name: Kelli Vazquez MRN: [id number] Date of Birth: 1929/04/19  [facility] CM/SW Contact  Eduard Roux, [place] Phone Number: 06/06/2018, 3:09 PM  Clinical Narrative:     CSW called patient's daughter, Marylu Lund and left voice message regarding facility placement.   Antony Blackbird, MSW, [facility] Clinical Social Worker [phone]   Expected Discharge Plan: Skilled Nursing Facility Barriers to Discharge: Continued Medical Work up  Expected Discharge Plan and Services Expected Discharge Plan: Skilled Nursing Facility     Post Acute Care Choice: Skilled Nursing Facility Living arrangements for the past 2 months: Skilled Nursing Facility                                       Social Determinants of Health (SDOH) Interventions    Readmission Risk Interventions No flowsheet data found.

## 2018-06-06 NOTE — Progress Notes (Signed)
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 fib, diastolic CHF presents the ER with complaints of fevers and shortness of breath.  Patient was found to be febrile and elevated WBC of 23.  SHe was also having higher oxygen requirement with 10 L trach collar.  Suspicion for pneumonia versus bronchial tracheitis therefore initially started on broad-spectrum antibiotic given previous history of Pseudomonas.  Tracheal aspirate was sent.  Pulmonary was consulted.  COVIDnegative. Due to worsening BUN/Cr ratio, nephro consulted.    Assessment & Plan:   Principal Problem:   SIRS (systemic inflammatory response syndrome) (HCC) Active Problems:   Status post tracheostomy (HCC)   Status post insertion of percutaneous endoscopic gastrostomy (PEG) tube (HCC)   Atrial fibrillation, chronic   Chronic diastolic (congestive) heart failure (HCC)   History of completed stroke   Pressure ulcer, stage 2 (HCC)   Uremia due to inadequate renal perfusion  Sepsis secondary to healthcare acquired pneumonia; POA Acute on chronic respiratory failure with hypoxia, improved  -Chronically on Trach Collar -Incentive spirometry and flutter valve.  Bronchodilators scheduled and PRN.  Unable to diurese more as its causing intravascular dehydration and rise on BUN/Cr.  -Procalcitonin 0.15, follow-up procalcitonin.  Hopefully we can discontinue antibiotics within the next 24 hours.  Secretions improved.Marland Kitchen glycopyrolate to help with secretions.  -Repeat chest x-ray 5/29-stable left-sided basilar opacity concerning for pneumonia versus atelectasis with associated effusion.  -Continue bronchodilators.  Holding home diuretics due to signs of some intravascular volume depletion.  Hold IV diuretics as well.  Goals of care discussion with the patient's  daughter on 5/31.  Refer to their note for details appreciate palliative care team input  Uremia with mild acute kidney injury -Baseline creatinine 1.0.  BUN elevated at 1.9.  Slowly appears to be improving. Cr 1.33.  We will continue to trend this.   History of hemorrhagic CVA -On aspirin.  History of atrial flutter/fibrillation - Continue home regimen of Cardizem and Coreg.  Not on anticoagulation due to hemorrhagic CVA.  Chronic diastolic congestive heart failure -Resume her home regimen of torsemide  Anemia of chronic disease -Hemoglobin appears to be stable.  Continue to monitor this.  Stage II noninfected sacral pressure ulcer present prior to admission  Due to difficult IV Access and need for medications, PICC placed 6/1  Naby wants her to go to a diff facility upon discharge, will consult SW.  DVT prophylaxis: Lovenox full code Code Status: Full code for now.  Family Communication: Left message for Marylu Lund and spoke with Naby.  Disposition Plan: Hopefully discharge once Renal Function has improved to a satisfactory levels. BUN is still >100 trending downwards. High risk of decompensation.   Consultants:   Pulmonary  Nephrology  Palliative Care  Procedures:   PICC placed 6/1  Antimicrobials:   Cefepime   Subjective: Urine ouput is good. She is more awake and alert. Some bloody secretion through her trach.   Review of Systems Otherwise negative except as per HPI, including: Unable to obtain.   Objective: Vitals:   06/06/18 0814 06/06/18 0913 06/06/18 1130 06/06/18 1200  BP: 124/84 124/84 124/84 (!) 117/56  Pulse: 95 (!) 105 90 94  Resp: (!) 26 (!) 28 (!) 33 (!) 34  Temp: 98.3 F (36.8 C)   98.5 F (36.9 C)  TempSrc: Oral   Oral  SpO2: 96% 98% 97%   Weight:      Height:        Intake/Output Summary (Last 24 hours) at 06/06/2018 1335 Last data filed at 06/06/2018 0400 Gross per 24 hour  Intake -  Output 1100 ml  Net -1100 ml   Filed Weights    06/04/18 0438 06/05/18 0339 06/06/18 0401  Weight: 73.7 kg 72.6 kg 74.4 kg    Examination: Constitutional: non verbal, trach and peg in place.  Eyes: PERRL, lids and conjunctivae normal ENMT: Mucous membranes are moist. Posterior pharynx clear of any exudate or lesions.Normal dentition.  Neck: normal, supple, no masses, no thyromegaly Respiratory: diminished BS at the bases. .  Cardiovascular: Regular rate and rhythm, no murmurs / rubs / gallops. No extremity edema. 2+ pedal pulses. No carotid bruits.  Abdomen: no tenderness, no masses palpated. No hepatosplenomegaly. Bowel sounds positive. Peg tube Musculoskeletal: no clubbing / cyanosis. No joint deformity upper and lower extremities. Good ROM, no contractures. Normal muscle tone.  Skin: no rashes, lesions, ulcers. No induration Neurologic: tracks me across the room Psychiatric: Poor insight and judgement.      Data Reviewed:   CBC: Recent Labs  Lab 06/02/18 0441 06/03/18 0520 06/04/18 0443 06/05/18 0804 06/06/18 0725  WBC 12.5* 11.2* 12.8* 15.0* 14.8*  HGB 8.9* 8.8* 8.7* 9.1* 9.2*  HCT 28.5* 27.4* 27.1* 29.8* 30.1*  MCV 90.2 89.8 90.9 93.4 92.3  PLT 394 418* 394 427* 434*   Basic Metabolic Panel: Recent Labs  Lab 06/02/18 0441 06/03/18 0520 06/04/18 0443 06/05/18 0805 06/06/18 0725  NA 143 144 145 147* 146*  K 3.0* 3.1* 3.6 3.8 4.2  CL 94* 96* 97* 100 100  CO2 33* 32 36* 36* 35*  GLUCOSE 214* 237* 274* 144* 215*  BUN 100* 121* 135* 128* 109*  CREATININE 1.26* 1.45* 1.52* 1.44* 1.33*  CALCIUM 8.9 9.0 8.9 8.8* 8.7*  MG 2.6* 2.5* 2.6* 2.7* 2.7*  PHOS  --   --  4.0 3.6 4.4   GFR: Estimated Creatinine Clearance: 29.3 mL/min (A) (by C-G formula based on SCr of 1.33 mg/dL (H)). Liver Function Tests: Recent Labs  Lab 06/04/18 0443 06/05/18 0805 06/06/18 0725  ALBUMIN 2.0* 2.1* 2.1*   No results for input(s): LIPASE, AMYLASE in the last 168 hours. No results for input(s): AMMONIA in the last 168 hours.  Coagulation Profile: No results for input(s): INR, PROTIME in the last 168 hours. Cardiac Enzymes: No results for input(s): CKTOTAL, CKMB, CKMBINDEX, TROPONINI in the last 168 hours. BNP (last 3 results) No results for input(s): PROBNP in the last 8760 hours. HbA1C: No results for input(s): HGBA1C in the last 72 hours. CBG: Recent Labs  Lab 06/05/18 2038 06/06/18 0006 06/06/18 0405 06/06/18 0816 06/06/18 1213  GLUCAP 189* 148* 93 209* 196*   Lipid Profile: No results for input(s): CHOL, HDL, LDLCALC, TRIG, CHOLHDL, LDLDIRECT in the last 72 hours. Thyroid Function Tests: No results for input(s): TSH, T4TOTAL, FREET4, T3FREE, THYROIDAB in the last 72 hours. Anemia Panel: No results for input(s): VITAMINB12, FOLATE, FERRITIN, TIBC, IRON, RETICCTPCT in the last 72 hours. Sepsis Labs: Recent Labs  Lab 06/01/18 0842 06/03/18 0520 06/06/18 0725  PROCALCITON 0.51 0.43 0.15    Recent Results (from the past 240 hour(s))  SARS Coronavirus 2 (CEPHEID - Performed in [facility] hospital lab), Hosp Order     Status: None   Collection Time: 05/29/18  4:10 AM  Result Value Ref Range Status   SARS Coronavirus 2 NEGATIVE NEGATIVE  Final    Comment: (NOTE) If result is NEGATIVE SARS-CoV-2 target nucleic acids are NOT DETECTED. The SARS-CoV-2 RNA is generally detectable in upper and lower  respiratory specimens during the acute phase of infection. The lowest  concentration of SARS-CoV-2 viral copies this assay can detect is 250  copies / mL. A negative result does not preclude SARS-CoV-2 infection  and should not be used as the sole basis for treatment or other  patient management decisions.  A negative result may occur with  improper specimen collection / handling, submission of specimen other  than nasopharyngeal swab, presence of viral mutation(s) within the  areas targeted by this assay, and inadequate number of viral copies  (<250 copies / mL). A negative result must be combined  with clinical  observations, patient history, and epidemiological information. If result is POSITIVE SARS-CoV-2 target nucleic acids are DETECTED. The SARS-CoV-2 RNA is generally detectable in upper and lower  respiratory specimens dur ing the acute phase of infection.  Positive  results are indicative of active infection with SARS-CoV-2.  Clinical  correlation with patient history and other diagnostic information is  necessary to determine patient infection status.  Positive results do  not rule out bacterial infection or co-infection with other viruses. If result is PRESUMPTIVE POSTIVE SARS-CoV-2 nucleic acids MAY BE PRESENT.   A presumptive positive result was obtained on the submitted specimen  and confirmed on repeat testing.  While 2019 novel coronavirus  (SARS-CoV-2) nucleic acids may be present in the submitted sample  additional confirmatory testing may be necessary for epidemiological  and / or clinical management purposes  to differentiate between  SARS-CoV-2 and other Sarbecovirus currently known to infect humans.  If clinically indicated additional testing with an alternate test  methodology [phone]) is advised. The SARS-CoV-2 RNA is generally  detectable in upper and lower respiratory sp ecimens during the acute  phase of infection. The expected result is Negative. Fact Sheet for Patients:  BoilerBrush.com.cy Fact Sheet for Healthcare Providers: https://pope.com/ This test is not yet approved or cleared by the [place] FDA and has been authorized for detection and/or diagnosis of SARS-CoV-2 by FDA under an Emergency Use Authorization (EUA).  This EUA will remain in effect (meaning this test can be used) for the duration of the COVID-19 declaration under Section 564(b)(1) of the Act, 21 U.S.C. section 360bbb-3(b)(1), unless the authorization is terminated or revoked sooner. Performed at [facility], [address] [place], [place] [address]   MRSA PCR Screening     Status: None   Collection Time: 05/29/18 11:38 AM  Result Value Ref Range Status   MRSA by PCR NEGATIVE NEGATIVE Final    Comment:        The GeneXpert MRSA Assay (FDA approved for NASAL specimens only), is one component of a comprehensive MRSA colonization surveillance program. It is not intended to diagnose MRSA infection nor to guide or monitor treatment for MRSA infections. Performed at [facility], [address] [place], [place] [address]   Culture, respiratory (non-expectorated)     Status: None   Collection Time: 05/29/18  3:40 PM  Result Value Ref Range Status   Specimen Description TRACHEAL ASPIRATE  Final   Special Requests NONE  Final   Gram Stain   Final    ABUNDANT WBC PRESENT, PREDOMINANTLY PMN NO ORGANISMS SEEN    Culture   Final    RARE Consistent with normal respiratory flora. Performed at [facility], [address] [place], [place] [address]    Report Status 05/31/2018 FINAL  Final         Radiology Studies: No results found.      Scheduled Meds: . aspirin  81 mg Per Tube Daily  . budesonide (PULMICORT) nebulizer solution  0.5 mg Nebulization BID  . carvedilol  3.125 mg Per Tube BID WC  . chlorhexidine  15 mL Mouth Rinse BID  . diltiazem  30 mg Per Tube Q6H  . docusate  100 mg Per Tube BID  . enoxaparin (LOVENOX) injection  30 mg Subcutaneous Daily  . free water  200 mL Per Tube Q8H  . glycopyrrolate  1 mg Per Tube BID  . insulin aspart  0-9 Units Subcutaneous Q4H  . insulin aspart  3 Units Subcutaneous Q4H  . mouth rinse  15 mL Mouth Rinse q12n4p  . pantoprazole sodium  40 mg Per Tube Daily  . polyethylene glycol  17 g Per Tube BID   Continuous Infusions: . ceFEPime (MAXIPIME) IV 2 g (06/05/18 2336)  . feeding supplement (OSMOLITE 1.2 CAL) 1,000 mL (06/05/18 2339)     LOS: 9 days   Time spent= 25 mins    Ankit Joline Maxcy, MD [facility]  If  7PM-7AM, please contact night-coverage [email or web address] 06/06/2018, 1:35 PM

## 2018-06-06 NOTE — Progress Notes (Signed)
@  2120 Called and updated pt's daughter Marylu Lund on pt's condition. All questions answered. Pt requests Day Shift allow her to Face Time her mother around 1100.

## 2018-06-06 NOTE — Care Management Important Message (Signed)
Important Message  Patient Details  Name: Kelli Vazquez MRN: [id number] Date of Birth: 1929-10-21   Medicare Important Message Given:  Yes    Kaleya Douse 06/06/2018, 2:18 PM

## 2018-06-07 LAB — GLUCOSE, CAPILLARY
Glucose-Capillary: 175 mg/dL — ABNORMAL HIGH (ref 70–99)
Glucose-Capillary: 183 mg/dL — ABNORMAL HIGH (ref 70–99)
Glucose-Capillary: 160 mg/dL — ABNORMAL HIGH (ref 70–99)
Glucose-Capillary: 166 mg/dL — ABNORMAL HIGH (ref 70–99)
Glucose-Capillary: 118 mg/dL — ABNORMAL HIGH (ref 70–99)
Glucose-Capillary: 165 mg/dL — ABNORMAL HIGH (ref 70–99)

## 2018-06-07 LAB — RENAL FUNCTION PANEL
Albumin: 2.3 g/dL — ABNORMAL LOW (ref 3.5–5.0)
Anion gap: 9 (ref 5–15)
BUN: 97 mg/dL — ABNORMAL HIGH (ref 8–23)
CO2: 34 mmol/L — ABNORMAL HIGH (ref 22–32)
Calcium: 8.6 mg/dL — ABNORMAL LOW (ref 8.9–10.3)
Chloride: 102 mmol/L (ref 98–111)
Creatinine, Ser: 1.26 mg/dL — ABNORMAL HIGH (ref 0.44–1.00)
GFR calc Af Amer: 45 mL/min — ABNORMAL LOW (ref >60)
GFR calc non Af Amer: 39 mL/min — ABNORMAL LOW (ref >60)
Glucose, Bld: 202 mg/dL — ABNORMAL HIGH (ref 70–99)
Phosphorus: 4.1 mg/dL (ref 2.5–4.6)
Potassium: 4.4 mmol/L (ref 3.5–5.1)
Sodium: 145 mmol/L (ref 135–145)

## 2018-06-07 LAB — CBC
HCT: 29.6 % — ABNORMAL LOW (ref 36.0–46.0)
Hemoglobin: 9 g/dL — ABNORMAL LOW (ref 12.0–15.0)
MCH: 28.1 pg (ref 26.0–34.0)
MCHC: 30.4 g/dL (ref 30.0–36.0)
MCV: 92.5 fL (ref 80.0–100.0)
Platelets: 385 10*3/uL (ref 150–400)
RBC: 3.2 MIL/uL — ABNORMAL LOW (ref 3.87–5.11)
RDW: 18.3 % — ABNORMAL HIGH (ref 11.5–15.5)
WBC: 14.9 10*3/uL — ABNORMAL HIGH (ref 4.0–10.5)
nRBC: 0 % (ref 0.0–0.2)

## 2018-06-07 NOTE — Progress Notes (Signed)
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 fib, diastolic CHF presents the ER with complaints of fevers and shortness of breath.  Patient was found to be febrile and elevated WBC of 23.  SHe was also having higher oxygen requirement with 10 L trach collar.  Suspicion for pneumonia versus bronchial tracheitis therefore initially started on broad-spectrum antibiotic given previous history of Pseudomonas.  Tracheal aspirate was sent.  Pulmonary was consulted.  COVIDnegative. Due to worsening BUN/Cr ratio, nephro consulted.  Solu-Medrol discontinued and slowly BUN has been drifting downwards.   Assessment & Plan:   Principal Problem:   SIRS (systemic inflammatory response syndrome) (HCC) Active Problems:   Status post tracheostomy (HCC)   Status post insertion of percutaneous endoscopic gastrostomy (PEG) tube (HCC)   Atrial fibrillation, chronic   Chronic diastolic (congestive) heart failure (HCC)   History of completed stroke   Pressure ulcer, stage 2 (HCC)   Uremia due to inadequate renal perfusion  Sepsis secondary to healthcare acquired pneumonia; POA Acute on chronic respiratory failure with hypoxia, improved  -Chronically on Trach Collar -Incentive spirometry and flutter valve.  Bronchodilators scheduled and PRN.  Diurese as needed but with caution due to renal function -Tomorrow is the last day of cefepime.  Amount of secretions have improved.  Procalcitonin trended down.  Monitor for leukocytosis.  Glycopyrolate to help with secretions.  -Repeat chest x-ray 5/29-stable left-sided basilar opacity concerning for pneumonia versus atelectasis with associated effusion.  -Continue bronchodilators.  Holding home diuretics due to signs of some intravascular volume depletion.  Hold IV diuretics as well.  Goals of  care discussion with the patient's daughter on 5/31.  Refer to their note for details appreciate palliative care team input  Uremia with mild acute kidney injury -Baseline creatinine 1.0.  BUN and creatinine very slowly trending down.  Nephrology signed off.  History of hemorrhagic CVA -On aspirin.  History of atrial flutter/fibrillation - Continue home regimen of Cardizem and Coreg.  Not on anticoagulation due to hemorrhagic CVA.  Chronic diastolic congestive heart failure -Resume her home regimen of torsemide  Anemia of chronic disease -Hemoglobin appears to be stable.  Continue to monitor this.  Stage II noninfected sacral pressure ulcer present prior to admission  Due to difficult IV Access and need for medications, PICC placed 6/1  Kelli Vazquez wants her to go to a diff facility upon discharge, social worker consulted  DVT prophylaxis: Lovenox full code Code Status: Full code for now.  Family Communication: Left message for Kelli Vazquez and spoke with Kelli Vazquez.  Disposition Plan: BUN/creatinine still high, would want BUN to trend further more down prior to her discharge.  Consultants:   Pulmonary  Nephrology  Palliative Care  Procedures:   PICC placed 6/1  Antimicrobials:   Cefepime; planned last day 6/6   Subjective: Good urine output over last 24 hours.  She is more awake and alert.  Less secretions over last 24 hours.  Still mostly remains nonverbal were able to track me across the room  Review of Systems Otherwise negative except as per HPI, including: Unable to obtain.   Objective: Vitals:   06/07/18 0851 06/07/18 1207 06/07/18 1241 06/07/18 1303  BP:  98/84  128/77  Pulse: 96 (!) 106 96   Resp: (!) 25 (!) 34 (!) 26   Temp:  97.9 F (36.6  C)    TempSrc:  Oral    SpO2: 96% 98% 98%   Weight:      Height:        Intake/Output Summary (Last 24 hours) at 06/07/2018 1347 Last data filed at 06/07/2018 0800 Gross per 24 hour  Intake 6970 ml  Output 1850 ml  Net 5120  ml   Filed Weights   06/05/18 0339 06/06/18 0401 06/07/18 0530  Weight: 72.6 kg 74.4 kg 74.8 kg    Examination: Constitutional: NAD, calm, comfortable, trach in place Eyes: PERRL, lids and conjunctivae normal ENMT: Mucous membranes are moist. Posterior pharynx clear of any exudate or lesions.Normal dentition.  Neck: normal, supple, no masses, no thyromegaly Respiratory: Diminished breath sounds at the bases Cardiovascular: Regular rate and rhythm, no murmurs / rubs / gallops. No extremity edema. 2+ pedal pulses. No carotid bruits.  Abdomen: no tenderness, no masses palpated. No hepatosplenomegaly. Bowel sounds positive.  PEG tube in place Musculoskeletal: no clubbing / cyanosis. No joint deformity upper and lower extremities. Good ROM, no contractures. Normal muscle tone.  Skin: no rashes, lesions, ulcers. No induration Neurologic: Tracks me across the room Psychiatric: Awake and alert only to her name.  Difficult to carry on conversation due to her trach PICC line in place since 08/03/2018.  Right upper extremity  Data Reviewed:   CBC: Recent Labs  Lab 06/03/18 0520 06/04/18 0443 06/05/18 0804 06/06/18 0725 06/07/18 0545  WBC 11.2* 12.8* 15.0* 14.8* 14.9*  HGB 8.8* 8.7* 9.1* 9.2* 9.0*  HCT 27.4* 27.1* 29.8* 30.1* 29.6*  MCV 89.8 90.9 93.4 92.3 92.5  PLT 418* 394 427* 434* 385   Basic Metabolic Panel: Recent Labs  Lab 06/02/18 0441 06/03/18 0520 06/04/18 0443 06/05/18 0805 06/06/18 0725 06/07/18 0530  NA 143 144 145 147* 146* 145  K 3.0* 3.1* 3.6 3.8 4.2 4.4  CL 94* 96* 97* 100 100 102  CO2 33* 32 36* 36* 35* 34*  GLUCOSE 214* 237* 274* 144* 215* 202*  BUN 100* 121* 135* 128* 109* 97*  CREATININE 1.26* 1.45* 1.52* 1.44* 1.33* 1.26*  CALCIUM 8.9 9.0 8.9 8.8* 8.7* 8.6*  MG 2.6* 2.5* 2.6* 2.7* 2.7*  --   PHOS  --   --  4.0 3.6 4.4 4.1   GFR: Estimated Creatinine Clearance: 31.1 mL/min (A) (by C-G formula based on SCr of 1.26 mg/dL (H)). Liver Function Tests:  Recent Labs  Lab 06/04/18 0443 06/05/18 0805 06/06/18 0725 06/07/18 0530  ALBUMIN 2.0* 2.1* 2.1* 2.3*   No results for input(s): LIPASE, AMYLASE in the last 168 hours. No results for input(s): AMMONIA in the last 168 hours. Coagulation Profile: No results for input(s): INR, PROTIME in the last 168 hours. Cardiac Enzymes: No results for input(s): CKTOTAL, CKMB, CKMBINDEX, TROPONINI in the last 168 hours. BNP (last 3 results) No results for input(s): PROBNP in the last 8760 hours. HbA1C: No results for input(s): HGBA1C in the last 72 hours. CBG: Recent Labs  Lab 06/06/18 2022 06/07/18 0005 06/07/18 0410 06/07/18 0830 06/07/18 1201  GLUCAP 172* 183* 175* 160* 166*   Lipid Profile: No results for input(s): CHOL, HDL, LDLCALC, TRIG, CHOLHDL, LDLDIRECT in the last 72 hours. Thyroid Function Tests: No results for input(s): TSH, T4TOTAL, FREET4, T3FREE, THYROIDAB in the last 72 hours. Anemia Panel: No results for input(s): VITAMINB12, FOLATE, FERRITIN, TIBC, IRON, RETICCTPCT in the last 72 hours. Sepsis Labs: Recent Labs  Lab 06/01/18 0842 06/03/18 0520 06/06/18 0725  PROCALCITON 0.51 0.43 0.15    Recent  Results (from the past 240 hour(s))  SARS Coronavirus 2 (CEPHEID - Performed in [facility] hospital lab), Hosp Order     Status: None   Collection Time: 05/29/18  4:10 AM  Result Value Ref Range Status   SARS Coronavirus 2 NEGATIVE NEGATIVE Final    Comment: (NOTE) If result is NEGATIVE SARS-CoV-2 target nucleic acids are NOT DETECTED. The SARS-CoV-2 RNA is generally detectable in upper and lower  respiratory specimens during the acute phase of infection. The lowest  concentration of SARS-CoV-2 viral copies this assay can detect is 250  copies / mL. A negative result does not preclude SARS-CoV-2 infection  and should not be used as the sole basis for treatment or other  patient management decisions.  A negative result may occur with  improper specimen collection /  handling, submission of specimen other  than nasopharyngeal swab, presence of viral mutation(s) within the  areas targeted by this assay, and inadequate number of viral copies  (<250 copies / mL). A negative result must be combined with clinical  observations, patient history, and epidemiological information. If result is POSITIVE SARS-CoV-2 target nucleic acids are DETECTED. The SARS-CoV-2 RNA is generally detectable in upper and lower  respiratory specimens dur ing the acute phase of infection.  Positive  results are indicative of active infection with SARS-CoV-2.  Clinical  correlation with patient history and other diagnostic information is  necessary to determine patient infection status.  Positive results do  not rule out bacterial infection or co-infection with other viruses. If result is PRESUMPTIVE POSTIVE SARS-CoV-2 nucleic acids MAY BE PRESENT.   A presumptive positive result was obtained on the submitted specimen  and confirmed on repeat testing.  While 2019 novel coronavirus  (SARS-CoV-2) nucleic acids may be present in the submitted sample  additional confirmatory testing may be necessary for epidemiological  and / or clinical management purposes  to differentiate between  SARS-CoV-2 and other Sarbecovirus currently known to infect humans.  If clinically indicated additional testing with an alternate test  methodology [phone]) is advised. The SARS-CoV-2 RNA is generally  detectable in upper and lower respiratory sp ecimens during the acute  phase of infection. The expected result is Negative. Fact Sheet for Patients:  BoilerBrush.com.cy Fact Sheet for Healthcare Providers: https://pope.com/ This test is not yet approved or cleared by the [place] FDA and has been authorized for detection and/or diagnosis of SARS-CoV-2 by FDA under an Emergency Use Authorization (EUA).  This EUA will remain in effect (meaning this  test can be used) for the duration of the COVID-19 declaration under Section 564(b)(1) of the Act, 21 U.S.C. section 360bbb-3(b)(1), unless the authorization is terminated or revoked sooner. Performed at [facility], [address] [place], [place] [address]   MRSA PCR Screening     Status: None   Collection Time: 05/29/18 11:38 AM  Result Value Ref Range Status   MRSA by PCR NEGATIVE NEGATIVE Final    Comment:        The GeneXpert MRSA Assay (FDA approved for NASAL specimens only), is one component of a comprehensive MRSA colonization surveillance program. It is not intended to diagnose MRSA infection nor to guide or monitor treatment for MRSA infections. Performed at [facility], [address] [place], [place] [address]   Culture, respiratory (non-expectorated)     Status: None   Collection Time: 05/29/18  3:40 PM  Result Value Ref Range Status   Specimen Description TRACHEAL ASPIRATE  Final  Special Requests NONE  Final   Gram Stain   Final    ABUNDANT WBC PRESENT, PREDOMINANTLY PMN NO ORGANISMS SEEN    Culture   Final    RARE Consistent with normal respiratory flora. Performed at [facility], [address] [place], [place] [address]    Report Status 05/31/2018 FINAL  Final         Radiology Studies: No results found.      Scheduled Meds: . aspirin  81 mg Per Tube Daily  . budesonide (PULMICORT) nebulizer solution  0.5 mg Nebulization BID  . carvedilol  3.125 mg Per Tube BID WC  . chlorhexidine  15 mL Mouth Rinse BID  . diltiazem  30 mg Per Tube Q6H  . docusate  100 mg Per Tube BID  . enoxaparin (LOVENOX) injection  30 mg Subcutaneous Daily  . free water  200 mL Per Tube Q8H  . glycopyrrolate  1 mg Per Tube BID  . insulin aspart  0-9 Units Subcutaneous Q4H  . insulin aspart  3 Units Subcutaneous Q4H  . mouth rinse  15 mL Mouth Rinse q12n4p  . pantoprazole sodium  40 mg Per Tube Daily  . polyethylene glycol  17 g Per Tube  BID   Continuous Infusions: . ceFEPime (MAXIPIME) IV 2 g (06/06/18 2303)  . feeding supplement (OSMOLITE 1.2 CAL) 60 mL/hr at 06/07/18 0000     LOS: 10 days   Time spent= 25 mins     Joline Maxcyhirag , MD [facility]  If 7PM-7AM, please contact night-coverage [email or web address] 06/07/2018, 1:47 PM

## 2018-06-07 NOTE — Progress Notes (Signed)
Pharmacy Antibiotic Note  Kelli Vazquez is a 85 y.o. female with h/o tracheostomy admitted from [facility] on 05/28/2018 with fevers/SOB/HCAP.  Pharmacy consulted for Cefepime dosing.  Last dose is planned for 6/5 -WBC= 14.9, afebrile, SCr= 1.26, CrCl ~ 30   Plan: -No cefepime dose changes needed. End date is 06/07/18 Will sign off. Please contact pharmacy with any other needs.  Thank you Harland German, PharmD Clinical Pharmacist **Pharmacist phone directory can now be found on [email or web address] ([email or web address]).  Listed under [facility].

## 2018-06-08 LAB — CBC
HCT: 29.3 % — ABNORMAL LOW (ref 36.0–46.0)
Hemoglobin: 8.9 g/dL — ABNORMAL LOW (ref 12.0–15.0)
MCH: 28.7 pg (ref 26.0–34.0)
MCHC: 30.4 g/dL (ref 30.0–36.0)
MCV: 94.5 fL (ref 80.0–100.0)
Platelets: 354 10*3/uL (ref 150–400)
RBC: 3.1 MIL/uL — ABNORMAL LOW (ref 3.87–5.11)
RDW: 18.5 % — ABNORMAL HIGH (ref 11.5–15.5)
WBC: 17.9 10*3/uL — ABNORMAL HIGH (ref 4.0–10.5)
nRBC: 0.2 % (ref 0.0–0.2)

## 2018-06-08 LAB — GLUCOSE, CAPILLARY
Glucose-Capillary: 150 mg/dL — ABNORMAL HIGH (ref 70–99)
Glucose-Capillary: 151 mg/dL — ABNORMAL HIGH (ref 70–99)
Glucose-Capillary: 158 mg/dL — ABNORMAL HIGH (ref 70–99)
Glucose-Capillary: 165 mg/dL — ABNORMAL HIGH (ref 70–99)
Glucose-Capillary: 166 mg/dL — ABNORMAL HIGH (ref 70–99)
Glucose-Capillary: 167 mg/dL — ABNORMAL HIGH (ref 70–99)

## 2018-06-08 LAB — RENAL FUNCTION PANEL
Albumin: 2.2 g/dL — ABNORMAL LOW (ref 3.5–5.0)
Anion gap: 9 (ref 5–15)
BUN: 82 mg/dL — ABNORMAL HIGH (ref 8–23)
CO2: 34 mmol/L — ABNORMAL HIGH (ref 22–32)
Calcium: 8.5 mg/dL — ABNORMAL LOW (ref 8.9–10.3)
Chloride: 103 mmol/L (ref 98–111)
Creatinine, Ser: 1.16 mg/dL — ABNORMAL HIGH (ref 0.44–1.00)
GFR calc Af Amer: 49 mL/min — ABNORMAL LOW (ref >60)
GFR calc non Af Amer: 43 mL/min — ABNORMAL LOW (ref >60)
Glucose, Bld: 167 mg/dL — ABNORMAL HIGH (ref 70–99)
Phosphorus: 3.9 mg/dL (ref 2.5–4.6)
Potassium: 4.6 mmol/L (ref 3.5–5.1)
Sodium: 146 mmol/L — ABNORMAL HIGH (ref 135–145)

## 2018-06-08 LAB — MAGNESIUM: Magnesium: 2.7 mg/dL — ABNORMAL HIGH (ref 1.7–2.4)

## 2018-06-08 NOTE — Progress Notes (Signed)
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 fib, diastolic CHF presents the ER with complaints of fevers and shortness of breath.  Patient was found to be febrile and elevated WBC of 23.  SHe was also having higher oxygen requirement with 10 L trach collar.  Suspicion for pneumonia versus bronchial tracheitis therefore initially started on broad-spectrum antibiotic given previous history of Pseudomonas.  Tracheal aspirate was sent.  Pulmonary was consulted.  COVIDnegative. Due to worsening BUN/Cr ratio, nephro consulted.  Solu-Medrol discontinued and slowly BUN has been drifting downwards.  06/08/2018: Seen.  No new changes.   Assessment & Plan:   Principal Problem:   SIRS (systemic inflammatory response syndrome) (HCC) Active Problems:   Status post tracheostomy (HCC)   Status post insertion of percutaneous endoscopic gastrostomy (PEG) tube (HCC)   Atrial fibrillation, chronic   Chronic diastolic (congestive) heart failure (HCC)   History of completed stroke   Pressure ulcer, stage 2 (HCC)   Uremia due to inadequate renal perfusion  Sepsis secondary to healthcare acquired pneumonia; POA Acute on chronic respiratory failure with hypoxia, improved  -Chronically on Trach Collar -Incentive spirometry and flutter valve.  Bronchodilators scheduled and PRN.  Diurese as needed but with caution due to renal function -Tomorrow is the last day of cefepime.  Amount of secretions have improved.  Procalcitonin trended down.  Monitor for leukocytosis.  Glycopyrolate to help with secretions.  -Repeat chest x-ray 5/29-stable left-sided basilar opacity concerning for pneumonia versus atelectasis with associated effusion.  -Continue bronchodilators.  Holding home diuretics due to signs of some intravascular volume depletion.  Hold  IV diuretics as well.  Goals of care discussion with the patient's daughter on 5/31.  Refer to their note for details appreciate palliative care team input  06/08/2018: Complete course of antibiotics.  Further management will depend on goal of care.  Uremia with mild acute kidney injury -Baseline creatinine 1.0.  BUN and creatinine very slowly trending down.  Nephrology signed off. 06/08/2018: Check fecal occult blood.  History of hemorrhagic CVA -On aspirin.  History of atrial flutter/fibrillation - Continue home regimen of Cardizem and Coreg.  Not on anticoagulation due to hemorrhagic CVA.  Chronic diastolic congestive heart failure -Resume her home regimen of torsemide  Anemia of chronic disease -Hemoglobin appears to be stable.  Continue to monitor this.  Stage II noninfected sacral pressure ulcer present prior to admission  Due to difficult IV Access and need for medications, PICC placed 6/1  Naby wants her to go to a diff facility upon discharge, social worker consulted  DVT prophylaxis: Lovenox full code Code Status: Full code for now.  Family Communication: Left message for Marylu LundJanet and spoke with Naby.  Disposition Plan: BUN/creatinine still high, would want BUN to trend further more down prior to her discharge.  Consultants:   Pulmonary  Nephrology  Palliative Care  Procedures:   PICC placed 6/1  Antimicrobials:   Cefepime; planned last day 6/6   Subjective: Significant history from patient.  Objective: Vitals:   06/08/18 0953 06/08/18 1117 06/08/18 1153 06/08/18 1200  BP:  103/62 103/62   Pulse: 95  88 76  Resp: (!) 35  (!) 32 (!) 42  Temp:    98.2 F (36.8 C)  TempSrc:    Axillary  SpO2: 97%  91% 95%  Weight:  Height:        Intake/Output Summary (Last 24 hours) at 06/08/2018 1319 Last data filed at 06/08/2018 0552 Gross per 24 hour  Intake 540 ml  Output 650 ml  Net -110 ml   Filed Weights   06/06/18 0401 06/07/18 0530 06/08/18 0500   Weight: 74.4 kg 74.8 kg 74.6 kg    Examination: Constitutional: Not in any distress.  Status post trach and PEG. Eyes: Pallor. ENMT: Mucous membranes are moist. Posterior pharynx clear of any exudate or lesions.Normal dentition.  Neck: Status post trach. Respiratory: Decreased air entry.   Cardiovascular: S1-S2. Abdomen: PEG tube in place.  Nontender.   Neurologic: Awake and alert.    Data Reviewed:   CBC: Recent Labs  Lab 06/04/18 0443 06/05/18 0804 06/06/18 0725 06/07/18 0545 06/08/18 0426  WBC 12.8* 15.0* 14.8* 14.9* 17.9*  HGB 8.7* 9.1* 9.2* 9.0* 8.9*  HCT 27.1* 29.8* 30.1* 29.6* 29.3*  MCV 90.9 93.4 92.3 92.5 94.5  PLT 394 427* 434* 385 277   Basic Metabolic Panel: Recent Labs  Lab 06/03/18 0520 06/04/18 0443 06/05/18 0805 06/06/18 0725 06/07/18 0530 06/08/18 0424 06/08/18 0426  NA 144 145 147* 146* 145 146*  --   K 3.1* 3.6 3.8 4.2 4.4 4.6  --   CL 96* 97* 100 100 102 103  --   CO2 32 36* 36* 35* 34* 34*  --   GLUCOSE 237* 274* 144* 215* 202* 167*  --   BUN 121* 135* 128* 109* 97* 82*  --   CREATININE 1.45* 1.52* 1.44* 1.33* 1.26* 1.16*  --   CALCIUM 9.0 8.9 8.8* 8.7* 8.6* 8.5*  --   MG 2.5* 2.6* 2.7* 2.7*  --   --  2.7*  PHOS  --  4.0 3.6 4.4 4.1 3.9  --    GFR: Estimated Creatinine Clearance: 33.7 mL/min (A) (by C-G formula based on SCr of 1.16 mg/dL (H)). Liver Function Tests: Recent Labs  Lab 06/04/18 0443 06/05/18 0805 06/06/18 0725 06/07/18 0530 06/08/18 0424  ALBUMIN 2.0* 2.1* 2.1* 2.3* 2.2*   No results for input(s): LIPASE, AMYLASE in the last 168 hours. No results for input(s): AMMONIA in the last 168 hours. Coagulation Profile: No results for input(s): INR, PROTIME in the last 168 hours. Cardiac Enzymes: No results for input(s): CKTOTAL, CKMB, CKMBINDEX, TROPONINI in the last 168 hours. BNP (last 3 results) No results for input(s): PROBNP in the last 8760 hours. HbA1C: No results for input(s): HGBA1C in the last 72 hours. CBG:  Recent Labs  Lab 06/07/18 2030 06/08/18 0010 06/08/18 0440 06/08/18 0748 06/08/18 1209  GLUCAP 165* 167* 166* 151* 150*   Lipid Profile: No results for input(s): CHOL, HDL, LDLCALC, TRIG, CHOLHDL, LDLDIRECT in the last 72 hours. Thyroid Function Tests: No results for input(s): TSH, T4TOTAL, FREET4, T3FREE, THYROIDAB in the last 72 hours. Anemia Panel: No results for input(s): VITAMINB12, FOLATE, FERRITIN, TIBC, IRON, RETICCTPCT in the last 72 hours. Sepsis Labs: Recent Labs  Lab 06/03/18 0520 06/06/18 0725  PROCALCITON 0.43 0.15    Recent Results (from the past 240 hour(s))  Culture, respiratory (non-expectorated)     Status: None   Collection Time: 05/29/18  3:40 PM  Result Value Ref Range Status   Specimen Description TRACHEAL ASPIRATE  Final   Special Requests NONE  Final   Gram Stain   Final    ABUNDANT WBC PRESENT, PREDOMINANTLY PMN NO ORGANISMS SEEN    Culture   Final    RARE Consistent with normal  respiratory flora. Performed at [facility], [address] [place], [place] [address]    Report Status 05/31/2018 FINAL  Final         Radiology Studies: No results found.      Scheduled Meds: . aspirin  81 mg Per Tube Daily  . budesonide (PULMICORT) nebulizer solution  0.5 mg Nebulization BID  . carvedilol  3.125 mg Per Tube BID WC  . chlorhexidine  15 mL Mouth Rinse BID  . diltiazem  30 mg Per Tube Q6H  . docusate  100 mg Per Tube BID  . enoxaparin (LOVENOX) injection  30 mg Subcutaneous Daily  . free water  200 mL Per Tube Q8H  . glycopyrrolate  1 mg Per Tube BID  . insulin aspart  0-9 Units Subcutaneous Q4H  . insulin aspart  3 Units Subcutaneous Q4H  . mouth rinse  15 mL Mouth Rinse q12n4p  . pantoprazole sodium  40 mg Per Tube Daily  . polyethylene glycol  17 g Per Tube BID   Continuous Infusions: . feeding supplement (OSMOLITE 1.2 CAL) 1,000 mL (06/08/18 1136)     LOS: 11 days   Time spent= 25 mins    Barnetta ChapelSylvester I Eron Goble, MD  [facility]  If 7PM-7AM, please contact night-coverage [email or web address] 06/08/2018, 1:19 PM

## 2018-06-09 ENCOUNTER — Inpatient Hospital Stay (HOSPITAL_COMMUNITY): Payer: Medicare Other

## 2018-06-09 LAB — CBC
HCT: 27.5 % — ABNORMAL LOW (ref 36.0–46.0)
Hemoglobin: 8.3 g/dL — ABNORMAL LOW (ref 12.0–15.0)
MCH: 28.5 pg (ref 26.0–34.0)
MCHC: 30.2 g/dL (ref 30.0–36.0)
MCV: 94.5 fL (ref 80.0–100.0)
Platelets: 330 10*3/uL (ref 150–400)
RBC: 2.91 MIL/uL — ABNORMAL LOW (ref 3.87–5.11)
RDW: 18.7 % — ABNORMAL HIGH (ref 11.5–15.5)
WBC: 17.4 10*3/uL — ABNORMAL HIGH (ref 4.0–10.5)
nRBC: 0.2 % (ref 0.0–0.2)

## 2018-06-09 LAB — GLUCOSE, CAPILLARY
Glucose-Capillary: 131 mg/dL — ABNORMAL HIGH (ref 70–99)
Glucose-Capillary: 141 mg/dL — ABNORMAL HIGH (ref 70–99)
Glucose-Capillary: 159 mg/dL — ABNORMAL HIGH (ref 70–99)
Glucose-Capillary: 164 mg/dL — ABNORMAL HIGH (ref 70–99)
Glucose-Capillary: 168 mg/dL — ABNORMAL HIGH (ref 70–99)
Glucose-Capillary: 170 mg/dL — ABNORMAL HIGH (ref 70–99)
Glucose-Capillary: 191 mg/dL — ABNORMAL HIGH (ref 70–99)

## 2018-06-09 LAB — RENAL FUNCTION PANEL
Albumin: 2 g/dL — ABNORMAL LOW (ref 3.5–5.0)
Anion gap: 10 (ref 5–15)
BUN: 73 mg/dL — ABNORMAL HIGH (ref 8–23)
CO2: 32 mmol/L (ref 22–32)
Calcium: 8.3 mg/dL — ABNORMAL LOW (ref 8.9–10.3)
Chloride: 102 mmol/L (ref 98–111)
Creatinine, Ser: 1.11 mg/dL — ABNORMAL HIGH (ref 0.44–1.00)
GFR calc Af Amer: 52 mL/min — ABNORMAL LOW (ref >60)
GFR calc non Af Amer: 45 mL/min — ABNORMAL LOW (ref >60)
Glucose, Bld: 181 mg/dL — ABNORMAL HIGH (ref 70–99)
Phosphorus: 4 mg/dL (ref 2.5–4.6)
Potassium: 4.5 mmol/L (ref 3.5–5.1)
Sodium: 144 mmol/L (ref 135–145)

## 2018-06-09 LAB — PROCALCITONIN: Procalcitonin: 0.1 ng/mL

## 2018-06-09 LAB — MAGNESIUM: Magnesium: 2.7 mg/dL — ABNORMAL HIGH (ref 1.7–2.4)

## 2018-06-09 NOTE — Progress Notes (Signed)
Called by RN that patient has increased WOB after being rolled in bed for bath and changing. Upon entering room RN and tech are finishing. Patient has increased WOB, SPO2 is 97% on 28% ATC. RN stated she suctioned patient but states she sounds like she still has more. RT suctioned patient x2 and received small tan; pink tinged sputum. Listened to patient and no Rhonchi at this time. SPO2 still stable, patient looking more comfortable after suctioning and resting. RT will continue to monitor.

## 2018-06-09 NOTE — Progress Notes (Signed)
Central line dressing changed per protocol policy.  Noted in flow sheet under IV tab.  Pt tolerated procedure well.

## 2018-06-09 NOTE — Progress Notes (Signed)
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 fib, diastolic CHF presents the ER with complaints of fevers and shortness of breath.  Patient was found to be febrile and elevated WBC of 23.  SHe was also having higher oxygen requirement with 10 L trach collar.  Suspicion for pneumonia versus bronchial tracheitis therefore initially started on broad-spectrum antibiotic given previous history of Pseudomonas.  Tracheal aspirate was sent.  Pulmonary was consulted.  COVIDnegative. Due to worsening BUN/Cr ratio, nephro consulted.  Solu-Medrol discontinued and slowly BUN has been drifting downwards.  06/08/2018: Seen.  No new changes. 06/09/2018: Patient seen.  No significant history from patient.  Available records reviewed.  Will repeat chest x-ray, procalcitonin, cardiac BNP, CBC and renal panel.  Likely, patient will be discharged back to skilled nursing facility tomorrow if there is no significant finding.  We will also consult the social work team.  New changes.  Long-term prognosis is guarded.  Patient may benefit from palliative care input/follow up on discharge.  Assessment & Plan:   Principal Problem:   SIRS (systemic inflammatory response syndrome) (HCC) Active Problems:   Status post tracheostomy (HCC)   Status post insertion of percutaneous endoscopic gastrostomy (PEG) tube (HCC)   Atrial fibrillation, chronic   Chronic diastolic (congestive) heart failure (HCC)   History of completed stroke   Pressure ulcer, stage 2 (HCC)   Uremia due to inadequate renal perfusion  Sepsis secondary to healthcare acquired pneumonia; POA Acute on chronic respiratory failure with hypoxia, improved  -Chronically on Trach Collar -Incentive spirometry and flutter valve.  Bronchodilators scheduled and PRN.  Diurese as needed but with  caution due to renal function -Tomorrow is the last day of cefepime.  Amount of secretions have improved.  Procalcitonin trended down.  Monitor for leukocytosis.  Glycopyrolate to help with secretions.  -Repeat chest x-ray 5/29-stable left-sided basilar opacity concerning for pneumonia versus atelectasis with associated effusion.  -Continue bronchodilators.  Holding home diuretics due to signs of some intravascular volume depletion.  Hold IV diuretics as well.  Goals of care discussion with the patient's daughter on 5/31.  Refer to their note for details appreciate palliative care team input  06/09/2018: Completed course of antibiotics.  Further management will depend on goal of care.  Uremia with mild acute kidney injury -Baseline creatinine 1.0.  BUN and creatinine very slowly trending down.  Nephrology signed off. 06/08/2018: Check fecal occult blood. 06/09/2018: AKI is improving.  BUN remains elevated.  Query role of tube feeding.  Fecal occult blood is still pending.  History of hemorrhagic CVA -On aspirin.  History of atrial flutter/fibrillation - Continue home regimen of Cardizem and Coreg.  Not on anticoagulation due to hemorrhagic CVA.  Chronic diastolic congestive heart failure -Resume her home regimen of torsemide  Anemia of chronic disease -Hemoglobin appears to be stable.  Continue to monitor this.  Stage II noninfected sacral pressure ulcer present prior to admission  Due to difficult IV Access and need for medications, PICC placed 6/1  DVT prophylaxis: Lovenox full code Code Status: Full code for now.  Family Communication: Left message for Marylu LundJanet and spoke with Naby.  Disposition Plan: SNF  Consultants:   Pulmonary  Nephrology  Palliative Care  Procedures:   PICC placed 6/1  Antimicrobials:   Cefepime; planned last day 6/6   Subjective: No  significant history from patient.  Objective: Vitals:   06/09/18 0341 06/09/18 0500 06/09/18 0750 06/09/18 0816   BP:    126/70  Pulse: (!) 105  97 95  Resp: (!) 28  (!) 28 (!) 33  Temp:    97.6 F (36.4 C)  TempSrc:    Axillary  SpO2:    98%  Weight:  76 kg    Height:  5\' 3"  (1.6 m)      Intake/Output Summary (Last 24 hours) at 06/09/2018 1031 Last data filed at 06/09/2018 0610 Gross per 24 hour  Intake 706 ml  Output -  Net 706 ml   Filed Weights   06/07/18 0530 06/08/18 0500 06/09/18 0500  Weight: 74.8 kg 74.6 kg 76 kg    Examination: Constitutional: Not in any distress.  Status post trach and PEG. Eyes: Pallor. ENMT: Mucous membranes are moist. Posterior pharynx clear of any exudate or lesions.Normal dentition.  Neck: Status post trach. Respiratory: Decreased air entry.   Cardiovascular: S1-S2. Abdomen: PEG tube in place.  Nontender.   Neurologic: Awake and alert.    Data Reviewed:   CBC: Recent Labs  Lab 06/05/18 0804 06/06/18 0725 06/07/18 0545 06/08/18 0426 06/09/18 0421  WBC 15.0* 14.8* 14.9* 17.9* 17.4*  HGB 9.1* 9.2* 9.0* 8.9* 8.3*  HCT 29.8* 30.1* 29.6* 29.3* 27.5*  MCV 93.4 92.3 92.5 94.5 94.5  PLT 427* 434* 385 354 175   Basic Metabolic Panel: Recent Labs  Lab 06/04/18 0443 06/05/18 0805 06/06/18 0725 06/07/18 0530 06/08/18 0424 06/08/18 0426 06/09/18 0421  NA 145 147* 146* 145 146*  --  144  K 3.6 3.8 4.2 4.4 4.6  --  4.5  CL 97* 100 100 102 103  --  102  CO2 36* 36* 35* 34* 34*  --  32  GLUCOSE 274* 144* 215* 202* 167*  --  181*  BUN 135* 128* 109* 97* 82*  --  73*  CREATININE 1.52* 1.44* 1.33* 1.26* 1.16*  --  1.11*  CALCIUM 8.9 8.8* 8.7* 8.6* 8.5*  --  8.3*  MG 2.6* 2.7* 2.7*  --   --  2.7* 2.7*  PHOS 4.0 3.6 4.4 4.1 3.9  --  4.0   GFR: Estimated Creatinine Clearance: 35.5 mL/min (A) (by C-G formula based on SCr of 1.11 mg/dL (H)). Liver Function Tests: Recent Labs  Lab 06/05/18 0805 06/06/18 0725 06/07/18 0530 06/08/18 0424 06/09/18 0421  ALBUMIN 2.1* 2.1* 2.3* 2.2* 2.0*   No results for input(s): LIPASE, AMYLASE in the last 168  hours. No results for input(s): AMMONIA in the last 168 hours. Coagulation Profile: No results for input(s): INR, PROTIME in the last 168 hours. Cardiac Enzymes: No results for input(s): CKTOTAL, CKMB, CKMBINDEX, TROPONINI in the last 168 hours. BNP (last 3 results) No results for input(s): PROBNP in the last 8760 hours. HbA1C: No results for input(s): HGBA1C in the last 72 hours. CBG: Recent Labs  Lab 06/08/18 1648 06/08/18 2012 06/09/18 0014 06/09/18 0439 06/09/18 0842  GLUCAP 165* 158* 131* 164* 170*   Lipid Profile: No results for input(s): CHOL, HDL, LDLCALC, TRIG, CHOLHDL, LDLDIRECT in the last 72 hours. Thyroid Function Tests: No results for input(s): TSH, T4TOTAL, FREET4, T3FREE, THYROIDAB in the last 72 hours. Anemia Panel: No results for input(s): VITAMINB12, FOLATE, FERRITIN, TIBC, IRON, RETICCTPCT in the last 72 hours. Sepsis Labs: Recent Labs  Lab 06/03/18 0520 06/06/18 0725  PROCALCITON 0.43 0.15    No results found for this or any previous visit (from  the past 240 hour(s)).       Radiology Studies: No results found.      Scheduled Meds: . aspirin  81 mg Per Tube Daily  . budesonide (PULMICORT) nebulizer solution  0.5 mg Nebulization BID  . carvedilol  3.125 mg Per Tube BID WC  . chlorhexidine  15 mL Mouth Rinse BID  . diltiazem  30 mg Per Tube Q6H  . docusate  100 mg Per Tube BID  . enoxaparin (LOVENOX) injection  30 mg Subcutaneous Daily  . free water  200 mL Per Tube Q8H  . glycopyrrolate  1 mg Per Tube BID  . insulin aspart  0-9 Units Subcutaneous Q4H  . insulin aspart  3 Units Subcutaneous Q4H  . mouth rinse  15 mL Mouth Rinse q12n4p  . pantoprazole sodium  40 mg Per Tube Daily  . polyethylene glycol  17 g Per Tube BID   Continuous Infusions: . feeding supplement (OSMOLITE 1.2 CAL) 1,000 mL (06/09/18 0700)     LOS: 12 days   Time spent= 25 mins    Barnetta ChapelSylvester I Domonik Levario, MD [facility]  If 7PM-7AM, please contact  night-coverage [email or web address] 06/09/2018, 10:31 AM

## 2018-06-09 NOTE — Progress Notes (Signed)
Patient's daughter called for an update and ask about when patient will be discharged to go back to the center. Updated daughter on patient's actual status and advised her that medical and/or nursing staff team will give her an update tomorrow.

## 2018-06-10 LAB — GLUCOSE, CAPILLARY
Glucose-Capillary: 124 mg/dL — ABNORMAL HIGH (ref 70–99)
Glucose-Capillary: 124 mg/dL — ABNORMAL HIGH (ref 70–99)
Glucose-Capillary: 163 mg/dL — ABNORMAL HIGH (ref 70–99)
Glucose-Capillary: 119 mg/dL — ABNORMAL HIGH (ref 70–99)
Glucose-Capillary: 144 mg/dL — ABNORMAL HIGH (ref 70–99)

## 2018-06-10 LAB — CBC WITH DIFFERENTIAL/PLATELET
Abs Immature Granulocytes: 0.25 10*3/uL — ABNORMAL HIGH (ref 0.00–0.07)
Basophils Absolute: 0 10*3/uL (ref 0.0–0.1)
Basophils Relative: 0 %
Eosinophils Absolute: 0.3 10*3/uL (ref 0.0–0.5)
Eosinophils Relative: 2 %
HCT: 27.4 % — ABNORMAL LOW (ref 36.0–46.0)
Hemoglobin: 8.4 g/dL — ABNORMAL LOW (ref 12.0–15.0)
Immature Granulocytes: 1 %
Lymphocytes Relative: 5 %
Lymphs Abs: 0.9 10*3/uL (ref 0.7–4.0)
MCH: 29.1 pg (ref 26.0–34.0)
MCHC: 30.7 g/dL (ref 30.0–36.0)
MCV: 94.8 fL (ref 80.0–100.0)
Monocytes Absolute: 1.2 10*3/uL — ABNORMAL HIGH (ref 0.1–1.0)
Monocytes Relative: 7 %
Neutro Abs: 15.5 10*3/uL — ABNORMAL HIGH (ref 1.7–7.7)
Neutrophils Relative %: 85 %
Platelets: 336 10*3/uL (ref 150–400)
RBC: 2.89 MIL/uL — ABNORMAL LOW (ref 3.87–5.11)
RDW: 18.9 % — ABNORMAL HIGH (ref 11.5–15.5)
WBC: 18.2 10*3/uL — ABNORMAL HIGH (ref 4.0–10.5)
nRBC: 0.2 % (ref 0.0–0.2)

## 2018-06-10 LAB — RENAL FUNCTION PANEL
Albumin: 2.1 g/dL — ABNORMAL LOW (ref 3.5–5.0)
Anion gap: 7 (ref 5–15)
BUN: 70 mg/dL — ABNORMAL HIGH (ref 8–23)
CO2: 33 mmol/L — ABNORMAL HIGH (ref 22–32)
Calcium: 8.4 mg/dL — ABNORMAL LOW (ref 8.9–10.3)
Chloride: 101 mmol/L (ref 98–111)
Creatinine, Ser: 1.12 mg/dL — ABNORMAL HIGH (ref 0.44–1.00)
GFR calc Af Amer: 52 mL/min — ABNORMAL LOW (ref >60)
GFR calc non Af Amer: 44 mL/min — ABNORMAL LOW (ref >60)
Glucose, Bld: 141 mg/dL — ABNORMAL HIGH (ref 70–99)
Phosphorus: 4.1 mg/dL (ref 2.5–4.6)
Potassium: 4.6 mmol/L (ref 3.5–5.1)
Sodium: 141 mmol/L (ref 135–145)

## 2018-06-10 LAB — OCCULT BLOOD X 1 CARD TO LAB, STOOL: Fecal Occult Bld: POSITIVE — AB

## 2018-06-10 LAB — SARS CORONAVIRUS 2 BY RT PCR (HOSPITAL ORDER, PERFORMED IN ~~LOC~~ HOSPITAL LAB): SARS Coronavirus 2: NEGATIVE

## 2018-06-10 LAB — MAGNESIUM: Magnesium: 2.8 mg/dL — ABNORMAL HIGH (ref 1.7–2.4)

## 2018-06-10 MED ORDER — SODIUM CHLORIDE 0.9% FLUSH: 10.0000 mL | INTRAVENOUS | Status: DC | PRN

## 2018-06-10 MED ORDER — FUROSEMIDE 10 MG/ML IJ SOLN
40.0000 mg | Freq: Two times a day (BID) | INTRAMUSCULAR | Status: DC
  Administered 2018-06-10 – 2018-06-11 (×2): 40 mg via INTRAVENOUS
  Filled 2018-06-10 (×2): qty 4

## 2018-06-10 NOTE — TOC Progression Note (Addendum)
Transition of Care [facility]) - Progression Note    Patient Details  Name: Rei Contee MRN: [id number] Date of Birth: [birth year]  Transition of Care [facility]) CM/SW [place], [place] Phone Number: 06/10/2018, 11:46 AM  Clinical Narrative:     Patient will need COVID test 48 priors to discharge to SNF.  11:50- CSW spoke with patient's daughters, Dan Humphreys and [name], they have confirmed patient will return back to [facility] once the patient is medically stable.   11:45am CSW called patient's daughter, [name]- left voice message to contact CSW.  Thurmond Butts, MSW, [facility] Clinical Social Worker [phone]   Expected Discharge Plan: Skilled Nursing Facility Barriers to Discharge: Continued Medical Work up  Expected Discharge Plan and Services Expected Discharge Plan: [place] Choice: [place] arrangements for the past 2 months: [place]                                       Social Determinants of Health (SDOH) Interventions    Readmission Risk Interventions No flowsheet data found.

## 2018-06-10 NOTE — Progress Notes (Signed)
IV team consulted due to difficulty flushing purple lumen of PICC, red flushed with some resistance.  PICC is in pt's flacid RUE, which is edematous at baseline.  Per IV team RN, she was able to flush both lumens while pulling back on pt's skin to make taut.  No concern for occlusion.

## 2018-06-10 NOTE — Progress Notes (Signed)
Spoke with pt's daughter, [name].  She is eager to get her mother back to the facility from which she came when medically stable.  I told her that, as per my conversation with the attending today, she will likely be able to transfer back in the next day or so.  She was satisfied with this plan.  Repeat COVID-19 screen sent to lab as per protocol.

## 2018-06-10 NOTE — Progress Notes (Signed)
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 fib, diastolic CHF presents the ER with complaints of fevers and shortness of breath.  Patient was found to be febrile and elevated WBC of 23.  SHe was also having higher oxygen requirement with 10 L trach collar.  Suspicion for pneumonia versus bronchial tracheitis therefore initially started on broad-spectrum antibiotic given previous history of Pseudomonas.  Tracheal aspirate was sent.  Pulmonary was consulted.  COVIDnegative. Due to worsening BUN/Cr ratio, nephro consulted.  Solu-Medrol discontinued and slowly BUN has been drifting downwards.  06/09/2018: No significant history from patient.  Available records reviewed.  Will repeat chest x-ray, procalcitonin, cardiac BNP, CBC and renal panel.  Likely, patient will be discharged back to skilled nursing facility tomorrow if there is no significant finding.  We will also consult the social work team.  New changes.  Long-term prognosis is guarded.  Patient may benefit from palliative care input/follow up on discharge.  06/10/2018: Chest x-ray finding is noted (mild pulmonary edema).  Procalcitonin is within normal range.  Chronic leukocytosis is noted.  Fecal occult blood is positive.  Will discontinue subcutaneous Lovenox.  Will use SCD for DVT prophylaxis.  Will start patient on diuretics.  Will monitor renal function closely while on diuretics.  GI oozing is likely contributing to the elevated BUN.  Monitor H/H closely.  Repeat COVID is negative.  Pursue disposition in the next 1 to 2 days.  Goal of care will need to be defined.  Patient may benefit from palliative care team on discharge.  Guarded prognosis.    Assessment & Plan:   Principal Problem:   SIRS (systemic inflammatory response syndrome) (HCC) Active Problems:   Status  post tracheostomy (HCC)   Status post insertion of percutaneous endoscopic gastrostomy (PEG) tube (HCC)   Atrial fibrillation, chronic   Chronic diastolic (congestive) heart failure (HCC)   History of completed stroke   Pressure ulcer, stage 2 (HCC)   Uremia due to inadequate renal perfusion  Sepsis secondary to healthcare acquired pneumonia; POA Acute on chronic respiratory failure with hypoxia, improved  -Chronically on Trach Collar -Incentive spirometry and flutter valve.  Bronchodilators scheduled and PRN.  Diurese as needed but with caution due to renal function -Tomorrow is the last day of cefepime.  Amount of secretions have improved.  Procalcitonin trended down.  Monitor for leukocytosis.  Glycopyrolate to help with secretions.  -Repeat chest x-ray 5/29-stable left-sided basilar opacity concerning for pneumonia versus atelectasis with associated effusion.  -Continue bronchodilators.  Holding home diuretics due to signs of some intravascular volume depletion.  Hold IV diuretics as well.  Goals of care discussion with the patient's daughter on 5/31.  Refer to their note for details appreciate palliative care team input  06/09/2018: Completed course of antibiotics.  Further management will depend on goal of care.  06/10/2018: Possible disposition in the next 1 to 2 days.  Manage fluids.  Start patient on IV Lasix.  Monitor renal function closely.  Uremia with mild acute kidney injury -Baseline creatinine 1.0.  BUN and creatinine very slowly trending down.  Nephrology signed off. 06/08/2018: Check fecal occult blood. 06/09/2018: AKI is improving.  BUN remains elevated.  Query role of tube feeding.  Fecal occult blood is still pending. 06/10/2018: Serum creatinine is 1.12.  BUN is 70.  Fecal occult blood  is positive.  I guess the GI blood loss is contributing to the elevated BUN.  Patient actually looks volume overloaded.  We will introduce IV Lasix.  History of hemorrhagic CVA -On aspirin.   History of atrial flutter/fibrillation - Continue home regimen of Cardizem and Coreg.  Not on anticoagulation due to hemorrhagic CVA.  Chronic diastolic congestive heart failure -Resume her home regimen of torsemide  Anemia of chronic disease -Hemoglobin appears to be stable.  Continue to monitor this.  Stage II noninfected sacral pressure ulcer present prior to admission  Due to difficult IV Access and need for medications, PICC placed 6/1  DVT prophylaxis: Lovenox full code Code Status: Full code for now.  Family Communication: Left message for Marylu LundJanet and spoke with Naby.  Disposition Plan: SNF  Consultants:   Pulmonary  Nephrology  Palliative Care  Procedures:   PICC placed 6/1  Antimicrobials:   Cefepime; planned last day 06/08/2018   Subjective: No significant history from patient.  Objective: Vitals:   06/10/18 1300 06/10/18 1400 06/10/18 1500 06/10/18 1600  BP:    112/70  Pulse: 92 82 (!) 107 86  Resp: (!) 41 (!) 0 (!) 0 (!) 37  Temp:    98.6 F (37 C)  TempSrc:    Oral  SpO2: 100% 99% 98% 99%  Weight:      Height:        Intake/Output Summary (Last 24 hours) at 06/10/2018 1745 Last data filed at 06/10/2018 1647 Gross per 24 hour  Intake 0 ml  Output -  Net 0 ml   Filed Weights   06/08/18 0500 06/09/18 0500 06/10/18 0448  Weight: 74.6 kg 76 kg 76.5 kg    Examination: Constitutional: Not in any distress.  Status post trach and PEG. Eyes: Pallor. ENMT: Mucous membranes are moist. Posterior pharynx clear of any exudate or lesions.Normal dentition.  Neck: Status post trach. Respiratory: Decreased air entry.   Cardiovascular: S1-S2, with edema involving upper and lower extremities.. Abdomen: PEG tube in place.  Nontender.   Neurologic: Awake and alert.    Data Reviewed:   CBC: Recent Labs  Lab 06/06/18 0725 06/07/18 0545 06/08/18 0426 06/09/18 0421 06/10/18 0428  WBC 14.8* 14.9* 17.9* 17.4* 18.2*  NEUTROABS  --   --   --   --  15.5*   HGB 9.2* 9.0* 8.9* 8.3* 8.4*  HCT 30.1* 29.6* 29.3* 27.5* 27.4*  MCV 92.3 92.5 94.5 94.5 94.8  PLT 434* 385 354 330 336   Basic Metabolic Panel: Recent Labs  Lab 06/05/18 0805 06/06/18 0725 06/07/18 0530 06/08/18 0424 06/08/18 0426 06/09/18 0421 06/10/18 0426  NA 147* 146* 145 146*  --  144 141  K 3.8 4.2 4.4 4.6  --  4.5 4.6  CL 100 100 102 103  --  102 101  CO2 36* 35* 34* 34*  --  32 33*  GLUCOSE 144* 215* 202* 167*  --  181* 141*  BUN 128* 109* 97* 82*  --  73* 70*  CREATININE 1.44* 1.33* 1.26* 1.16*  --  1.11* 1.12*  CALCIUM 8.8* 8.7* 8.6* 8.5*  --  8.3* 8.4*  MG 2.7* 2.7*  --   --  2.7* 2.7* 2.8*  PHOS 3.6 4.4 4.1 3.9  --  4.0 4.1   GFR: Estimated Creatinine Clearance: 35.3 mL/min (A) (by C-G formula based on SCr of 1.12 mg/dL (H)). Liver Function Tests: Recent Labs  Lab 06/06/18 0725 06/07/18 0530 06/08/18 0424 06/09/18 0421 06/10/18 0426  ALBUMIN 2.1*  2.3* 2.2* 2.0* 2.1*   No results for input(s): LIPASE, AMYLASE in the last 168 hours. No results for input(s): AMMONIA in the last 168 hours. Coagulation Profile: No results for input(s): INR, PROTIME in the last 168 hours. Cardiac Enzymes: No results for input(s): CKTOTAL, CKMB, CKMBINDEX, TROPONINI in the last 168 hours. BNP (last 3 results) No results for input(s): PROBNP in the last 8760 hours. HbA1C: No results for input(s): HGBA1C in the last 72 hours. CBG: Recent Labs  Lab 06/09/18 2055 06/09/18 2355 06/10/18 0413 06/10/18 0806 06/10/18 1203  GLUCAP 159* 141* 124* 124* 163*   Lipid Profile: No results for input(s): CHOL, HDL, LDLCALC, TRIG, CHOLHDL, LDLDIRECT in the last 72 hours. Thyroid Function Tests: No results for input(s): TSH, T4TOTAL, FREET4, T3FREE, THYROIDAB in the last 72 hours. Anemia Panel: No results for input(s): VITAMINB12, FOLATE, FERRITIN, TIBC, IRON, RETICCTPCT in the last 72 hours. Sepsis Labs: Recent Labs  Lab 06/06/18 0725 06/09/18 1248  PROCALCITON 0.15 0.10     Recent Results (from the past 240 hour(s))  SARS Coronavirus 2 (CEPHEID - Performed in [facility] hospital lab), Hosp Order     Status: None   Collection Time: 06/10/18  1:09 PM  Result Value Ref Range Status   SARS Coronavirus 2 NEGATIVE NEGATIVE Final    Comment: (NOTE) If result is NEGATIVE SARS-CoV-2 target nucleic acids are NOT DETECTED. The SARS-CoV-2 RNA is generally detectable in upper and lower  respiratory specimens during the acute phase of infection. The lowest  concentration of SARS-CoV-2 viral copies this assay can detect is 250  copies / mL. A negative result does not preclude SARS-CoV-2 infection  and should not be used as the sole basis for treatment or other  patient management decisions.  A negative result may occur with  improper specimen collection / handling, submission of specimen other  than nasopharyngeal swab, presence of viral mutation(s) within the  areas targeted by this assay, and inadequate number of viral copies  (<250 copies / mL). A negative result must be combined with clinical  observations, patient history, and epidemiological information. If result is POSITIVE SARS-CoV-2 target nucleic acids are DETECTED. The SARS-CoV-2 RNA is generally detectable in upper and lower  respiratory specimens dur ing the acute phase of infection.  Positive  results are indicative of active infection with SARS-CoV-2.  Clinical  correlation with patient history and other diagnostic information is  necessary to determine patient infection status.  Positive results do  not rule out bacterial infection or co-infection with other viruses. If result is PRESUMPTIVE POSTIVE SARS-CoV-2 nucleic acids MAY BE PRESENT.   A presumptive positive result was obtained on the submitted specimen  and confirmed on repeat testing.  While 2019 novel coronavirus  (SARS-CoV-2) nucleic acids may be present in the submitted sample  additional confirmatory testing may be necessary for  epidemiological  and / or clinical management purposes  to differentiate between  SARS-CoV-2 and other Sarbecovirus currently known to infect humans.  If clinically indicated additional testing with an alternate test  methodology [phone]) is advised. The SARS-CoV-2 RNA is generally  detectable in upper and lower respiratory sp ecimens during the acute  phase of infection. The expected result is Negative. Fact Sheet for Patients:  StrictlyIdeas.no Fact Sheet for Healthcare Providers: BankingDealers.co.za This test is not yet approved or cleared by the [place] FDA and has been authorized for detection and/or diagnosis of SARS-CoV-2 by FDA under an Emergency Use Authorization (EUA).  This EUA will remain  in effect (meaning this test can be used) for the duration of the COVID-19 declaration under Section 564(b)(1) of the Act, 21 U.S.C. section 360bbb-3(b)(1), unless the authorization is terminated or revoked sooner. Performed at [facility], [address] [place], [place] [address]          Radiology Studies: Dg Chest Port 1 View  Result Date: 06/09/2018 CLINICAL DATA:  Shortness of breath. EXAM: PORTABLE CHEST 1 VIEW COMPARISON:  06/01/2018 FINDINGS: Patient slightly rotated to the right. Tracheostomy tube in adequate position and unchanged. Interval placement of right-sided PICC line with tip over the SVC. Lungs are adequately inflated demonstrate persistent left effusion with left basilar atelectasis. Persistent linear density over the left midlung likely atelectasis. Persistent mild prominence of the perihilar markings suggesting mild vascular congestion. Stable mild cardiomegaly. Remainder of the exam is unchanged. IMPRESSION: No significant change in findings suggesting mild congestive failure with small left effusion. Infection is possible. Tubes and lines as described. Electronically Signed   By: Elberta Fortisaniel  Boyle M.D.   On:  06/09/2018 17:25        Scheduled Meds: . aspirin  81 mg Per Tube Daily  . budesonide (PULMICORT) nebulizer solution  0.5 mg Nebulization BID  . carvedilol  3.125 mg Per Tube BID WC  . chlorhexidine  15 mL Mouth Rinse BID  . diltiazem  30 mg Per Tube Q6H  . docusate  100 mg Per Tube BID  . free water  200 mL Per Tube Q8H  . furosemide  40 mg Intravenous Q12H  . glycopyrrolate  1 mg Per Tube BID  . insulin aspart  0-9 Units Subcutaneous Q4H  . insulin aspart  3 Units Subcutaneous Q4H  . mouth rinse  15 mL Mouth Rinse q12n4p  . pantoprazole sodium  40 mg Per Tube Daily  . polyethylene glycol  17 g Per Tube BID   Continuous Infusions: . feeding supplement (OSMOLITE 1.2 CAL) 1,000 mL (06/10/18 0017)     LOS: 13 days   Time spent= 25 mins    Barnetta ChapelSylvester I Rajveer Handler, MD [facility]  If 7PM-7AM, please contact night-coverage [email or web address] 06/10/2018, 5:45 PM

## 2018-06-10 NOTE — Care Management Important Message (Signed)
Important Message  Patient Details  Name: Kelli Vazquez MRN: [id number] Date of Birth: 15-Aug-1929   Medicare Important Message Given:  Yes    [name] 06/10/2018, 12:11 PM

## 2018-06-11 LAB — CBC WITH DIFFERENTIAL/PLATELET
Abs Immature Granulocytes: 0.21 10*3/uL — ABNORMAL HIGH (ref 0.00–0.07)
Basophils Absolute: 0 10*3/uL (ref 0.0–0.1)
Basophils Relative: 0 %
Eosinophils Absolute: 0.4 10*3/uL (ref 0.0–0.5)
Eosinophils Relative: 3 %
HCT: 27 % — ABNORMAL LOW (ref 36.0–46.0)
Hemoglobin: 8.3 g/dL — ABNORMAL LOW (ref 12.0–15.0)
Immature Granulocytes: 2 %
Lymphocytes Relative: 7 %
Lymphs Abs: 0.9 10*3/uL (ref 0.7–4.0)
MCH: 28.8 pg (ref 26.0–34.0)
MCHC: 30.7 g/dL (ref 30.0–36.0)
MCV: 93.8 fL (ref 80.0–100.0)
Monocytes Absolute: 1.2 10*3/uL — ABNORMAL HIGH (ref 0.1–1.0)
Monocytes Relative: 9 %
Neutro Abs: 10.9 10*3/uL — ABNORMAL HIGH (ref 1.7–7.7)
Neutrophils Relative %: 79 %
Platelets: 325 10*3/uL (ref 150–400)
RBC: 2.88 MIL/uL — ABNORMAL LOW (ref 3.87–5.11)
RDW: 19.2 % — ABNORMAL HIGH (ref 11.5–15.5)
WBC: 13.6 10*3/uL — ABNORMAL HIGH (ref 4.0–10.5)
nRBC: 0.1 % (ref 0.0–0.2)

## 2018-06-11 LAB — GLUCOSE, CAPILLARY
Glucose-Capillary: 122 mg/dL — ABNORMAL HIGH (ref 70–99)
Glucose-Capillary: 126 mg/dL — ABNORMAL HIGH (ref 70–99)
Glucose-Capillary: 149 mg/dL — ABNORMAL HIGH (ref 70–99)
Glucose-Capillary: 154 mg/dL — ABNORMAL HIGH (ref 70–99)
Glucose-Capillary: 157 mg/dL — ABNORMAL HIGH (ref 70–99)

## 2018-06-11 LAB — MAGNESIUM: Magnesium: 2.6 mg/dL — ABNORMAL HIGH (ref 1.7–2.4)

## 2018-06-11 LAB — RENAL FUNCTION PANEL
Albumin: 2.1 g/dL — ABNORMAL LOW (ref 3.5–5.0)
Anion gap: 8 (ref 5–15)
BUN: 68 mg/dL — ABNORMAL HIGH (ref 8–23)
CO2: 34 mmol/L — ABNORMAL HIGH (ref 22–32)
Calcium: 8.3 mg/dL — ABNORMAL LOW (ref 8.9–10.3)
Chloride: 99 mmol/L (ref 98–111)
Creatinine, Ser: 1.05 mg/dL — ABNORMAL HIGH (ref 0.44–1.00)
GFR calc Af Amer: 56 mL/min — ABNORMAL LOW (ref >60)
GFR calc non Af Amer: 48 mL/min — ABNORMAL LOW (ref >60)
Glucose, Bld: 122 mg/dL — ABNORMAL HIGH (ref 70–99)
Phosphorus: 4.3 mg/dL (ref 2.5–4.6)
Potassium: 4 mmol/L (ref 3.5–5.1)
Sodium: 141 mmol/L (ref 135–145)

## 2018-06-11 NOTE — Progress Notes (Signed)
06/11/2018 1720 Pt discharges to SNF per [facility].   Carney [place]

## 2018-06-11 NOTE — Discharge Summary (Signed)
Physician Discharge Summary  Kelli Vazquez EQA:[id number] DOB: Jul 29, 1929 DOA: 05/28/2018  PCP: Kipp Brood, MD  Admit date: 05/28/2018 Discharge date: 06/11/2018  Admitted From: SNF Disposition: SNF  Recommendations for Outpatient Follow-up:  1. Follow up with PCP in 1-2 weeks 2. Please obtain CBC/BMP/Mag at follow up 3. 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 fib, diastolic CHF presents the ER with complaints of fevers and shortness of breath.  Patient was found to be febrile, with leukocytosis to 23 and hypoxemia requiring 10 L through trach collar. Suspicion for pneumonia versus bronchial tracheitis therefore initially started on broad-spectrum antibiotic given previous history of Pseudomonas.  Tracheal aspirate culture was negative.  Pulmonary was consulted for assistance with trach management.  COVID-19 negative x2.  Due to worsening BUN/Cr ratio, nephro consulted but azotemia and renal function improved.  Patient completed antibiotic course for HCAP with cefepime.  Leukocytosis improved.  She remained afebrile. Azotemia improved.  Renal function back to baseline.   Given patient's comorbidities and prognosis, palliative care was consulted and recommended full scope treatment after discussion with the family.  See individual problem list below for more.  Discharge Diagnoses:  Principal Problem:   SIRS (systemic inflammatory response syndrome) (HCC) Active Problems:   Status post tracheostomy ([place])   Status post insertion of percutaneous endoscopic gastrostomy (PEG) tube (HCC)   Atrial fibrillation, chronic   Chronic diastolic (congestive) heart failure (HCC)   History of completed stroke   Pressure ulcer, stage 2 (HCC)   Uremia due to inadequate renal  perfusion  Sepsis due to HCAP inpatient with trach collar: POA Acute on chronic respiratory failure with hypoxemia: Improved.  Reportedly on 3 L at SNF.  Currently satting at 98% on 3 L. -Completed antibiotic course with cefepime -Continue trach collar -Outpatient follow-up with pulmonology  AKI with azotemia: Baseline creatinine 0.9-1.1. -Azotemia improved.  Serum creatinine back to baseline. -Nephrology consulted and signed off. -Recommend repeat BMP and magnesium at follow-up.  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  Not on anticoagulation due to hemorrhagic CVA.  Chronic diastolic congestive heart failure: Appears euvolemic.  Cardiac BNP within normal range but unreliable given morbid obesity.  CXR on 6/80/20 with mild pulmonary edema.  Diuresis with IV Lasix 40 mg twice daily -Discharged on home torsemide and metolazone. -Recommend repeat BMP and magnesium at follow-up. -Assess fluid status at follow-up and adjust diuretics as appropriate.  Anemia of chronic disease: Stable -Recheck CBC at follow-up  Stage II noninfected sacral pressure ulcer present prior to admission  Discharge Instructions  Discharge Instructions    Diet - low sodium heart healthy   Complete by:  As directed    Increase activity slowly   Complete by:  As directed      Allergies as of 06/11/2018      Reactions   Latex Rash   Penicillins Rash   Did it involve swelling of the face/tongue/throat, SOB, or low BP? No Did it involve sudden or severe rash/hives, skin peeling, or any reaction on the inside of your mouth or nose? No Did you need to seek medical attention at a hospital or doctor's office? No When did it last happen? If all above  answers are NO, may proceed with cephalosporin use.      Medication List    STOP taking these  medications   atorvastatin 20 MG tablet Commonly known as:  LIPITOR   bisacodyl 10 MG suppository Commonly known as:  DULCOLAX   chlorhexidine gluconate (MEDLINE KIT) 0.12 % solution Commonly known as:  PERIDEX   docusate 50 MG/5ML liquid Commonly known as:  COLACE   enoxaparin 40 MG/0.4ML injection Commonly known as:  LOVENOX   mouth rinse Liqd solution     TAKE these medications   acetaminophen 650 MG CR tablet Commonly known as:  TYLENOL Take 650 mg by mouth every 6 (six) hours as needed for pain. What changed:  Another medication with the same name was removed. Continue taking this medication, and follow the directions you see here.   aspirin 81 MG chewable tablet Place 1 tablet (81 mg total) into feeding tube daily.   carvedilol 3.125 MG tablet Commonly known as:  COREG Place 3.125 mg into feeding tube 2 (two) times a day.   diltiazem 30 MG tablet Commonly known as:  CARDIZEM Place 30 mg into feeding tube every 6 (six) hours. What changed:  Another medication with the same name was removed. Continue taking this medication, and follow the directions you see here.   feeding supplement (OSMOLITE 1.2 CAL) Liqd Place 1,000 mLs into feeding tube continuous.   feeding supplement (PRO-STAT SUGAR FREE 64) Liqd Place 30 mLs into feeding tube 2 (two) times daily.   FISH OIL PO Place 4 g into feeding tube daily.   free water Soln Place 400 mLs into feeding tube 2 times daily at 12 noon and 4 pm.   insulin aspart 100 UNIT/ML injection Commonly known as:  novoLOG Inject 1-3 Units into the skin every 4 (four) hours.   ipratropium-albuterol 0.5-2.5 (3) MG/3ML Soln Commonly known as:  DUONEB Take 3 mLs by nebulization every 6 (six) hours as needed (SOB).   Melatonin 3 MG Tabs 3 mg by PEG Tube route at bedtime.   metolazone 5 MG tablet Commonly known as:  ZAROXOLYN Place 5 mg into feeding tube every Monday, Wednesday, and Friday.   multivitamin with minerals  tablet Place 1 tablet into feeding tube daily.   pantoprazole sodium 40 mg/20 mL Pack Commonly known as:  PROTONIX Place 20 mLs (40 mg total) into feeding tube daily.   polyethylene glycol 17 g packet Commonly known as:  MIRALAX / GLYCOLAX Place 17 g into feeding tube 2 (two) times daily. What changed:  when to take this   scopolamine 1 MG/3DAYS Commonly known as:  TRANSDERM-SCOP Place 1 patch onto the skin every 3 (three) days.   sertraline 50 MG tablet Commonly known as:  ZOLOFT Place 50 mg into feeding tube daily.   tamsulosin 0.4 MG Caps capsule Commonly known as:  FLOMAX Take 0.4 mg by mouth daily.   torsemide 20 MG tablet Commonly known as:  DEMADEX Place 2 tablets (40 mg total) into feeding tube daily. What changed:    how much to take  when to take this       Contact information for follow-up providers    Kipp Brood, MD. Schedule an appointment as soon as possible for a visit in 2 week(s).   Specialty:  Pulmonary Disease Contact information: [address] [place] [address] [phone]            Contact information for after-discharge care    Destination  [facility] .   Service:  Skilled Nursing Contact information: [place] [phone]                  Consultations:  PCCM  Nephrology  Palliative care  Procedures/Studies:  2D Echo: None this admission  Dg Chest Port 1 View  Result Date: 06/09/2018 CLINICAL DATA:  Shortness of breath. EXAM: PORTABLE CHEST 1 VIEW COMPARISON:  06/01/2018 FINDINGS: Patient slightly rotated to the right. Tracheostomy tube in adequate position and unchanged. Interval placement of right-sided PICC line with tip over the SVC. Lungs are adequately inflated demonstrate persistent left effusion with left basilar atelectasis. Persistent linear density over the left midlung likely atelectasis. Persistent mild prominence of the  perihilar markings suggesting mild vascular congestion. Stable mild cardiomegaly. Remainder of the exam is unchanged. IMPRESSION: No significant change in findings suggesting mild congestive failure with small left effusion. Infection is possible. Tubes and lines as described. Electronically Signed   By: Marin Olp M.D.   On: 06/09/2018 17:25   Dg Chest Port 1 View  Result Date: 06/01/2018 CLINICAL DATA:  Encounter for dyspnea EXAM: PORTABLE CHEST 1 VIEW COMPARISON:  Yesterday FINDINGS: Tracheostomy tube in place. Cardiomegaly and vascular pedicle widening. There is vascular congestion and pleural effusion on the left more than right. The left lower lobe is obscured or opacified. IMPRESSION: 1. Stable from yesterday. 2. Cardiomegaly, vascular congestion, and left more than right pleural effusion. Electronically Signed   By: Monte Fantasia M.D.   On: 06/01/2018 15:48   Dg Chest Port 1 View  Result Date: 05/31/2018 CLINICAL DATA:  Dyspnea. EXAM: PORTABLE CHEST 1 VIEW COMPARISON:  Radiograph May 29, 2018. FINDINGS: Stable cardiomegaly. Atherosclerosis of thoracic aorta is noted. Tracheostomy tube is unchanged in position. No pneumothorax is noted. Right lung is clear. Stable left midlung and basilar opacity is noted. Some degree of pleural effusion cannot be excluded. Bony thorax is unremarkable. IMPRESSION: Stable left basilar opacity is noted concerning for pneumonia or atelectasis with associated pleural effusion. Stable left midlung atelectasis is noted. Aortic Atherosclerosis (ICD10-I70.0). Electronically Signed   By: Marijo Conception M.D.   On: 05/31/2018 10:02   Dg Chest Port 1 View  Result Date: 05/29/2018 CLINICAL DATA:  Shortness of breath EXAM: PORTABLE CHEST 1 VIEW COMPARISON:  03/17/2018 FINDINGS: Tracheostomy tube in place. Retrocardiac opacification that is hazy. There is streaky left perihilar density. No pneumothorax. Chronic cardiomegaly. IMPRESSION: Retrocardiac atelectasis or  pneumonia with probable pleural fluid. Electronically Signed   By: Monte Fantasia M.D.   On: 05/29/2018 04:55   Vas [place] Upper Extremity Venous Duplex  Result Date: 05/29/2018 UPPER VENOUS STUDY  Indications: Edema Performing Technologist: June Leap RDMS, RVT  Examination Guidelines: A complete evaluation includes B-mode imaging, spectral Doppler, color Doppler, and power Doppler as needed of all accessible portions of each vessel. Bilateral testing is considered an integral part of a complete examination. Limited examinations for reoccurring indications may be performed as noted.  Right Findings: +----------+------------+---------+-----------+----------+---------------+  RIGHT      Compressible Phasicity Spontaneous Properties     Summary      +----------+------------+---------+-----------+----------+---------------+  IJV                                                      unable to image  +----------+------------+---------+-----------+----------+---------------+  Subclavian     Full        Yes        Yes                                 +----------+------------+---------+-----------+----------+---------------+  Axillary       Full        Yes        Yes                                 +----------+------------+---------+-----------+----------+---------------+  Brachial       Full        Yes        Yes                                 +----------+------------+---------+-----------+----------+---------------+  Radial         Full                                                       +----------+------------+---------+-----------+----------+---------------+  Ulnar          Full                                                       +----------+------------+---------+-----------+----------+---------------+  Cephalic       Full                                                       +----------+------------+---------+-----------+----------+---------------+  Basilic        Full                                                        +----------+------------+---------+-----------+----------+---------------+ unable to image right IJV due to trach bandages  Left Findings: +----+------------+---------+-----------+----------+--------------+  LEFT Compressible Phasicity Spontaneous Properties    Summary      +----+------------+---------+-----------+----------+--------------+  IJV                                                Not visualized  +----+------------+---------+-----------+----------+--------------+  Summary:  Right: No evidence of deep vein thrombosis in the upper extremity. No evidence of superficial vein thrombosis in the upper extremity.  *See table(s) above for measurements and observations.  Diagnosing physician: Harold Barban MD Electronically signed by Harold Barban MD on 05/29/2018 at 1:09:24 PM.    Final    Ir Picc Placement Right >5 Yrs Inc Img Guide  Result Date: 06/03/2018 INDICATION: CHF, systemic inflammatory response syndrome, access for medical therapy, poor peripheral veins EXAM: ULTRASOUND  AND FLUOROSCOPIC GUIDED PICC LINE INSERTION MEDICATIONS: 1% lidocaine local CONTRAST:  None FLUOROSCOPY TIME:  Twelve seconds (1 mGy) COMPLICATIONS: None immediate. TECHNIQUE: The procedure, risks, benefits, and alternatives were explained to the patient's family and informed written consent was obtained. A timeout was performed prior to the initiation of the procedure. The right upper extremity was prepped with chlorhexidine in a sterile fashion, and a sterile drape was applied covering the operative field. Maximum barrier sterile technique with sterile gowns and gloves were used for the procedure. A timeout was performed prior to the initiation of the procedure. Local anesthesia was provided with 1% lidocaine. Under direct ultrasound guidance, the right basilic vein was accessed with a micropuncture kit after the overlying soft tissues were anesthetized with 1% lidocaine. An ultrasound image was saved for documentation  purposes. A guidewire was advanced to the level of the superior caval-atrial junction for measurement purposes and the PICC line was cut to length. A peel-away sheath was placed and a 39 cm, 5 [place], dual lumen was inserted to level of the superior caval-atrial junction. A post procedure spot fluoroscopic was obtained. The catheter easily aspirated and flushed and was sutured in place. A dressing was placed. The patient tolerated the procedure well without immediate post procedural complication. FINDINGS: After catheter placement, the tip lies within the superior cavoatrial junction. The catheter aspirates and flushes normally and is ready for immediate use. IMPRESSION: Successful ultrasound and fluoroscopic guided placement of a right basilic vein approach, 39 cm, 5 French, dual lumen PICC with tip at the superior caval-atrial junction. The PICC line is ready for immediate use. Electronically Signed   By: Jerilynn Mages.  Shick M.D.   On: 06/03/2018 16:52     Subjective: No major events overnight of this morning.  Patient awake but does not comprehend. She only nodes her head up and down to every question even with spanish interpretor.  Does not appear to be in distress.  Discharge Exam: Vitals:   06/11/18 0749 06/11/18 0753  BP:  123/78  Pulse: 83 89  Resp: (!) 42 (!) 28  Temp:  97.7 F (36.5 C)  SpO2: 97% 98%    GENERAL: Chronically ill-appearing HEENT: MMM.  Vision and hearing grossly intact.  NECK: Trach collar in place. LUNGS:  No IWOB.  Fair air movement bilaterally.  Rhonchi but limited exam due to body habitus. HEART:  RRR. Heart sounds normal.  ABD: Bowel sounds present. Soft. Non tender.  PEG tube in place. MSK/EXT: Right hemiparesis.  1+ pitting edema bilaterally.  Intermittently moves her left upper extremity. SKIN: Stage II sacral decubitus wound reported by nurse. NEURO: Awake but does not comprehend or follow commands. PSYCH: Calm.  Does not appear to be in distress or pain.   The  results of significant diagnostics from this hospitalization (including imaging, microbiology, ancillary and laboratory) are listed below for reference.     Microbiology: Recent Results (from the past 240 hour(s))  SARS Coronavirus 2 (CEPHEID - Performed in [place] hospital lab), Hosp Order     Status: None   Collection Time: 06/10/18  1:09 PM  Result Value Ref Range Status   SARS Coronavirus 2 NEGATIVE NEGATIVE Final    Comment: (NOTE) If result is NEGATIVE SARS-CoV-2 target nucleic acids are NOT DETECTED. The SARS-CoV-2 RNA is generally detectable in upper and lower  respiratory specimens during the acute phase of infection. The lowest  concentration of SARS-CoV-2 viral copies this assay can detect is 250  copies / mL. A  negative result does not preclude SARS-CoV-2 infection  and should not be used as the sole basis for treatment or other  patient management decisions.  A negative result may occur with  improper specimen collection / handling, submission of specimen other  than nasopharyngeal swab, presence of viral mutation(s) within the  areas targeted by this assay, and inadequate number of viral copies  (<250 copies / mL). A negative result must be combined with clinical  observations, patient history, and epidemiological information. If result is POSITIVE SARS-CoV-2 target nucleic acids are DETECTED. The SARS-CoV-2 RNA is generally detectable in upper and lower  respiratory specimens dur ing the acute phase of infection.  Positive  results are indicative of active infection with SARS-CoV-2.  Clinical  correlation with patient history and other diagnostic information is  necessary to determine patient infection status.  Positive results do  not rule out bacterial infection or co-infection with other viruses. If result is PRESUMPTIVE POSTIVE SARS-CoV-2 nucleic acids MAY BE PRESENT.   A presumptive positive result was obtained on the submitted specimen  and confirmed on  repeat testing.  While 2019 novel coronavirus  (SARS-CoV-2) nucleic acids may be present in the submitted sample  additional confirmatory testing may be necessary for epidemiological  and / or clinical management purposes  to differentiate between  SARS-CoV-2 and other Sarbecovirus currently known to infect humans.  If clinically indicated additional testing with an alternate test  methodology [phone]) is advised. The SARS-CoV-2 RNA is generally  detectable in upper and lower respiratory sp ecimens during the acute  phase of infection. The expected result is Negative. Fact Sheet for Patients:  StrictlyIdeas.no Fact Sheet for Healthcare Providers: BankingDealers.co.za This test is not yet approved or cleared by the [place] FDA and has been authorized for detection and/or diagnosis of SARS-CoV-2 by FDA under an Emergency Use Authorization (EUA).  This EUA will remain in effect (meaning this test can be used) for the duration of the COVID-19 declaration under Section 564(b)(1) of the Act, 21 U.S.C. section 360bbb-3(b)(1), unless the authorization is terminated or revoked sooner. Performed at [facility], [place] [address], [place], [place] [address]      Labs: BNP (last 3 results) Recent Labs    06/01/18 0348 06/02/18 0441 06/03/18 0520  BNP 1,203.0* 941.4* 300.9*   Basic Metabolic Panel: Recent Labs  Lab 06/06/18 0725 06/07/18 0530 06/08/18 0424 06/08/18 0426 06/09/18 0421 06/10/18 0426 06/11/18 0341  NA 146* 145 146*  --  144 141 141  K 4.2 4.4 4.6  --  4.5 4.6 4.0  CL 100 102 103  --  102 101 99  CO2 35* 34* 34*  --  32 33* 34*  GLUCOSE 215* 202* 167*  --  181* 141* 122*  BUN 109* 97* 82*  --  73* 70* 68*  CREATININE 1.33* 1.26* 1.16*  --  1.11* 1.12* 1.05*  CALCIUM 8.7* 8.6* 8.5*  --  8.3* 8.4* 8.3*  MG 2.7*  --   --  2.7* 2.7* 2.8* 2.6*  PHOS 4.4 4.1 3.9  --  4.0 4.1 4.3   Liver Function Tests: Recent  Labs  Lab 06/07/18 0530 06/08/18 0424 06/09/18 0421 06/10/18 0426 06/11/18 0341  ALBUMIN 2.3* 2.2* 2.0* 2.1* 2.1*   No results for input(s): LIPASE, AMYLASE in the last 168 hours. No results for input(s): AMMONIA in the last 168 hours. CBC: Recent Labs  Lab 06/07/18 0545 06/08/18 0426 06/09/18 0421 06/10/18 0428 06/11/18 0342  WBC 14.9* 17.9*  17.4* 18.2* 13.6*  NEUTROABS  --   --   --  15.5* 10.9*  HGB 9.0* 8.9* 8.3* 8.4* 8.3*  HCT 29.6* 29.3* 27.5* 27.4* 27.0*  MCV 92.5 94.5 94.5 94.8 93.8  PLT 385 354 330 336 325   Cardiac Enzymes: No results for input(s): CKTOTAL, CKMB, CKMBINDEX, TROPONINI in the last 168 hours. BNP: Invalid input(s): POCBNP CBG: Recent Labs  Lab 06/10/18 1806 06/10/18 2022 06/10/18 2358 06/11/18 0418 06/11/18 0751  GLUCAP 119* 144* 154* 149* 157*   D-Dimer No results for input(s): DDIMER in the last 72 hours. Hgb A1c No results for input(s): HGBA1C in the last 72 hours. Lipid Profile No results for input(s): CHOL, HDL, LDLCALC, TRIG, CHOLHDL, LDLDIRECT in the last 72 hours. Thyroid function studies No results for input(s): TSH, T4TOTAL, T3FREE, THYROIDAB in the last 72 hours.  Invalid input(s): FREET3 Anemia work up No results for input(s): VITAMINB12, FOLATE, FERRITIN, TIBC, IRON, RETICCTPCT in the last 72 hours. Urinalysis    Component Value Date/Time   COLORURINE YELLOW 06/03/2018 1830   APPEARANCEUR HAZY (A) 06/03/2018 1830   LABSPEC 1.014 06/03/2018 1830   PHURINE 6.0 06/03/2018 1830   GLUCOSEU NEGATIVE 06/03/2018 1830   HGBUR LARGE (A) 06/03/2018 1830   BILIRUBINUR NEGATIVE 06/03/2018 1830   KETONESUR NEGATIVE 06/03/2018 1830   PROTEINUR 30 (A) 06/03/2018 1830   NITRITE NEGATIVE 06/03/2018 1830   LEUKOCYTESUR NEGATIVE 06/03/2018 1830   Sepsis Labs Invalid input(s): PROCALCITONIN,  WBC,  LACTICIDVEN   Time coordinating discharge: 35 minutes  SIGNED:  Mercy Riding, MD  [facility] 06/11/2018, 11:17 AM Pager  [phone]  If 7PM-7AM, please contact night-coverage [email or web address] Password [email or web address]

## 2018-06-11 NOTE — TOC Transition Note (Signed)
Transition of Care [facility]) - CM/SW Discharge Note   Patient Details  Name: Kelli Vazquez MRN: [id number] Date of Birth: [birth year]  [facility] CM/SW Contact:  Vinie Sill, [place] Phone Number: 06/11/2018, 4:08 PM   Clinical Narrative:     Patient will DC to: [facility] Date: 06/11/2018 Family Notified: [name], daughter Transport By: Corey Harold  RN, patient, and facility notified of DC. Discharge Summary sent to facility. RN given number for report [phone], Room 137-B. DC packet on chart. Ambulance transport requested for patient.   Clinical Social Worker signing off.  Thurmond Butts, MSW, [facility] Clinical Social Worker [phone]    Final next level of care: Skilled Nursing Facility Barriers to Discharge: No Barriers Identified   Patient Goals and CMS Choice Patient states their goals for this hospitalization and ongoing recovery are:: Patient nonverbal      Discharge Placement   Existing PASRR number confirmed : 06/04/18          Patient [facility] Patient to be transferred to facility by: [employer] Name of family member notified: Left voice message with her daughter, [name]  Patient and family notified of of transfer: 06/11/18  Discharge Plan and Services     Post Acute Care Choice: [place]                               Social Determinants of Health (SDOH) Interventions     Readmission Risk Interventions No flowsheet data found.

## 2018-06-11 NOTE — Progress Notes (Signed)
06/11/2018 6:09 PM Attempted to call report x2-no answer. [place]

## 2018-07-03 DEATH — deceased

## 2020-04-04 IMAGING — CT CT HEAD W/O CM
4 series · 16 of 47 positions shown, 18 images · non-contrast
Comparison: Brain MRI 01/28/2018

CLINICAL DATA: Stroke follow-up

EXAM:
CT HEAD WITHOUT CONTRAST
TECHNIQUE: Contiguous axial images were obtained from the base of the skull
through the vertex without intravenous contrast.

[Series 3: head without · axial · non-contrast · 0.39mm/px · z∈[-93,+12]mm · 6 of 31 slices shown, 8 images]
[im 5/31  brain]
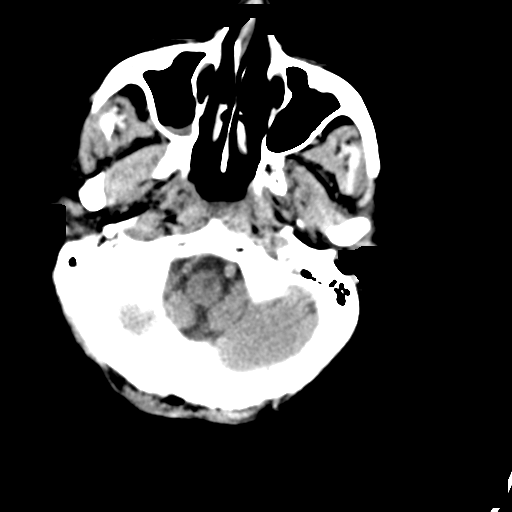
[im 5/31  bone]
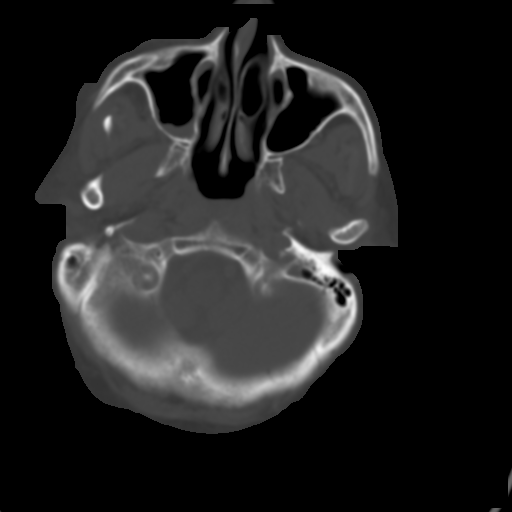
[im 9/31  brain]
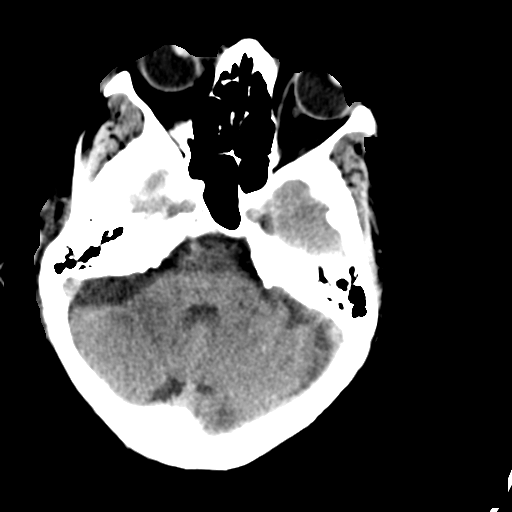
[im 13/31  brain]
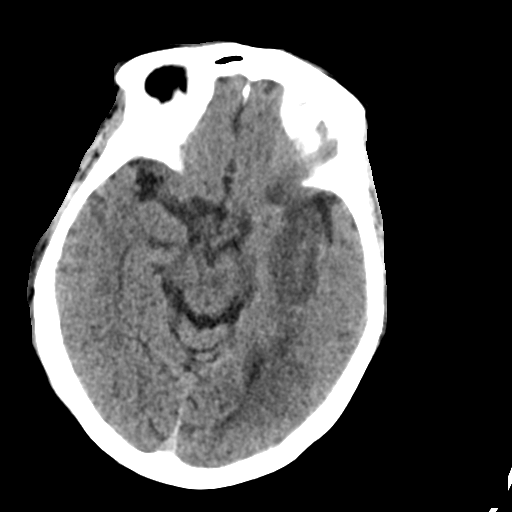
[im 18/31  brain]
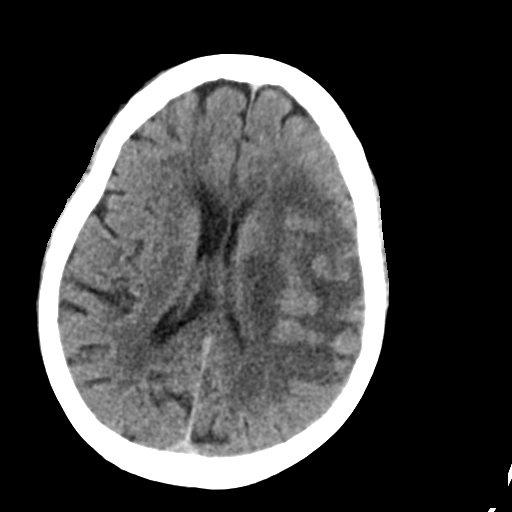
[im 22/31  brain]
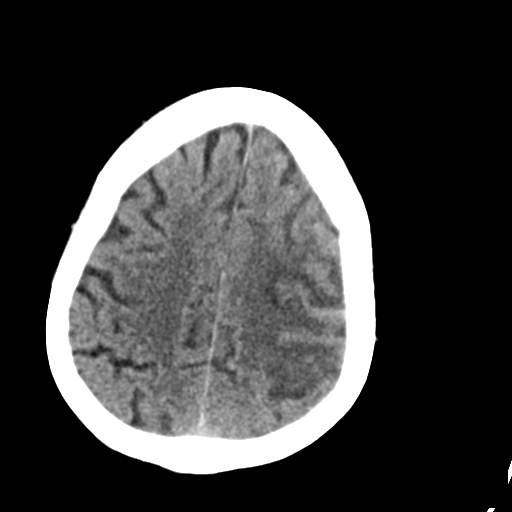
[im 22/31  bone]
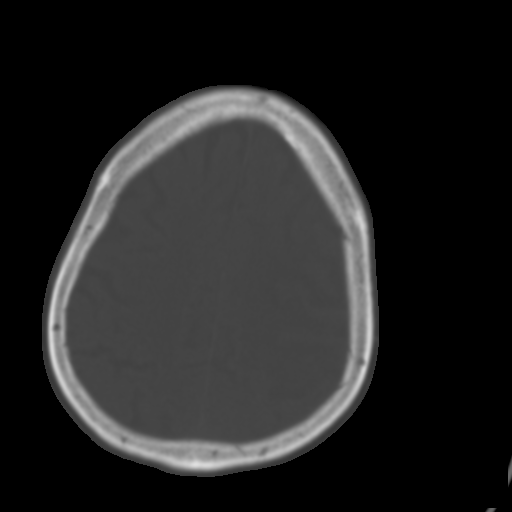
[im 26/31  brain]
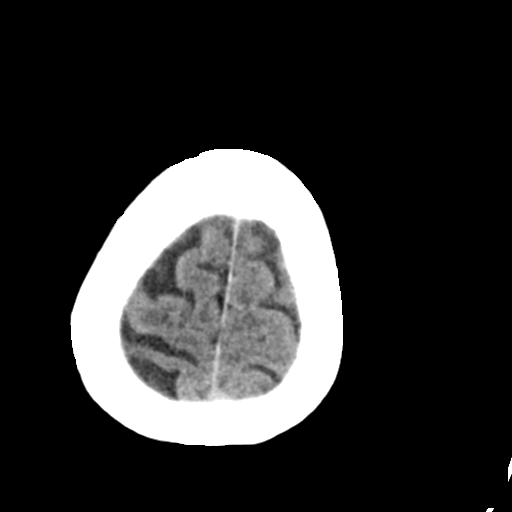

[Series 4: head bone · axial · 0.39mm/px · z∈[-99,-45]mm · 4 of 82 slices shown]
[im 8/82  bone]
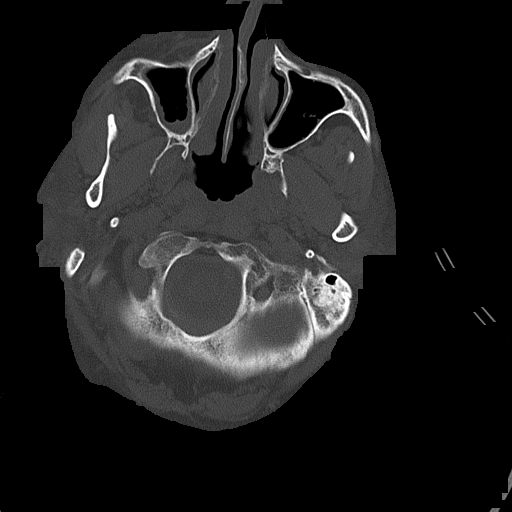
[im 16/82  bone]
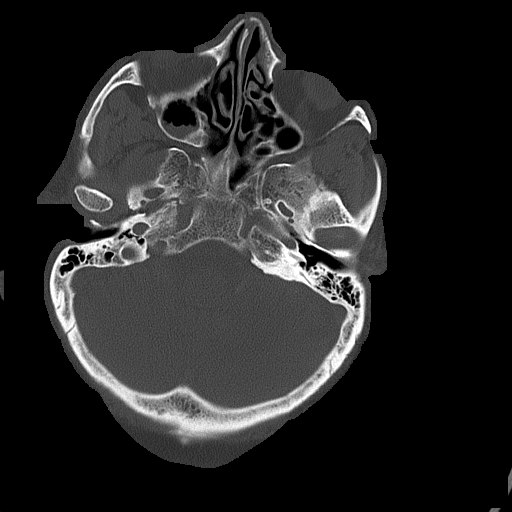
[im 28/82  bone]
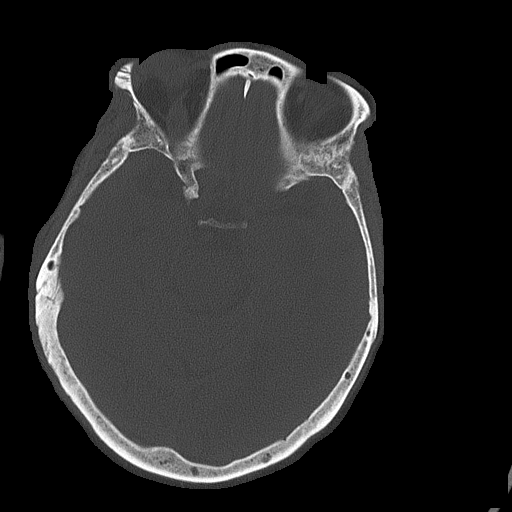
[im 35/82  bone]
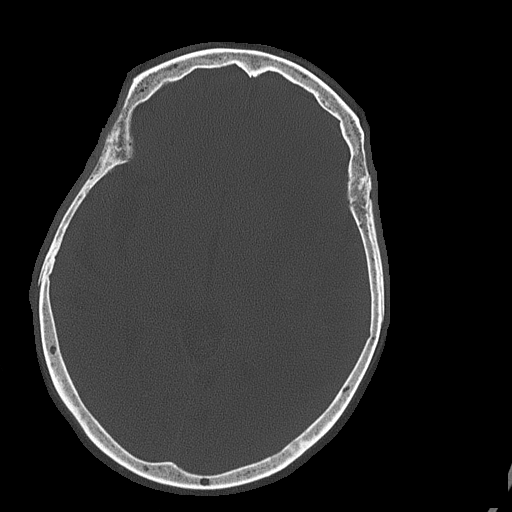

[Series 5: head without cor · coronal · non-contrast · 0.30mm/px · 3 of 67 slices shown]
[im 23/67  brain]
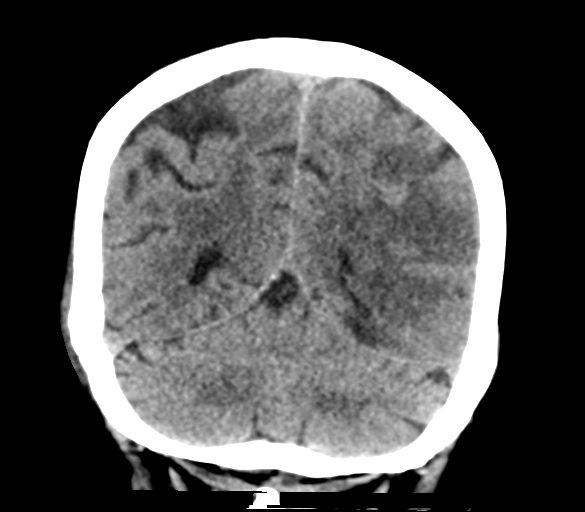
[im 30/67  brain]
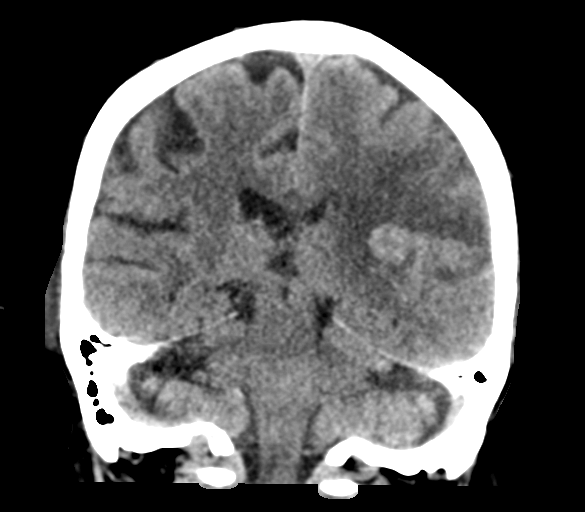
[im 37/67  brain]
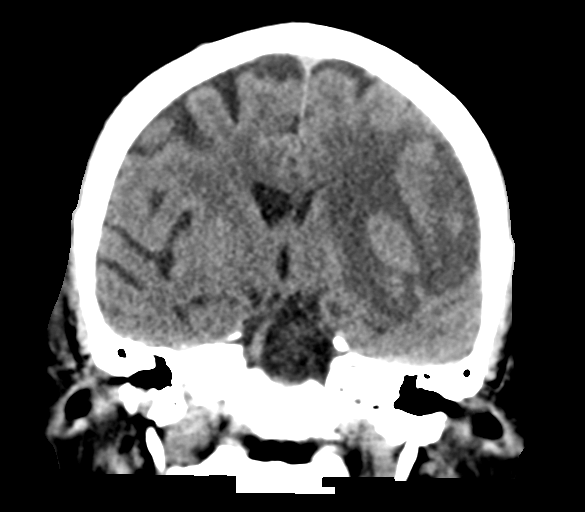

[Series 6: head without sag · sagittal · non-contrast · 0.31mm/px · 3 of 56 slices shown]
[im 19/56  brain]
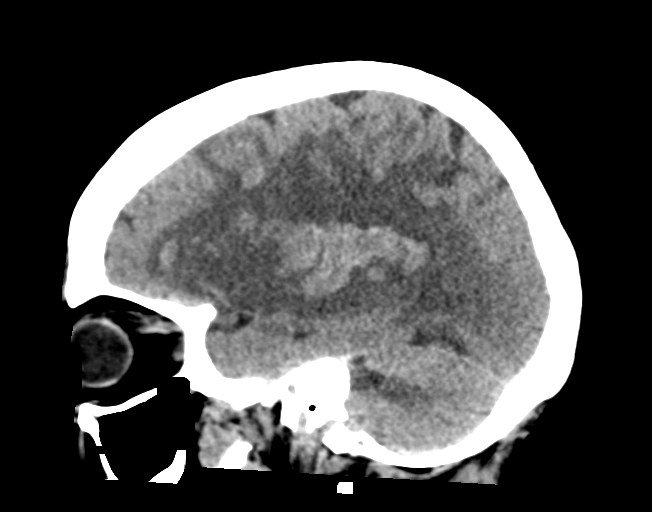
[im 28/56  brain]
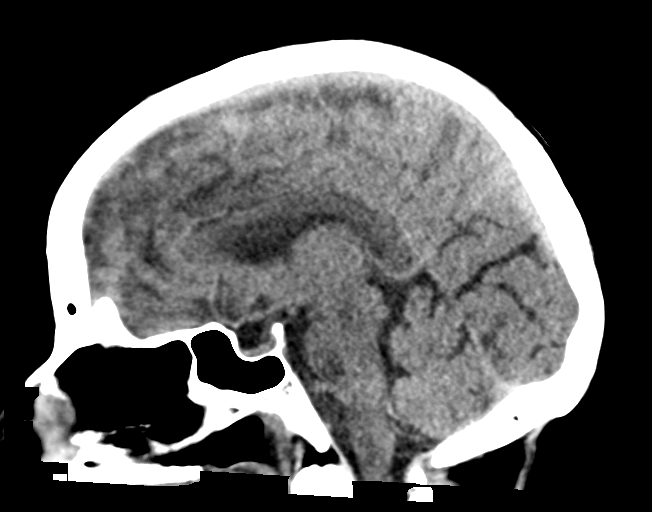
[im 37/56  brain]
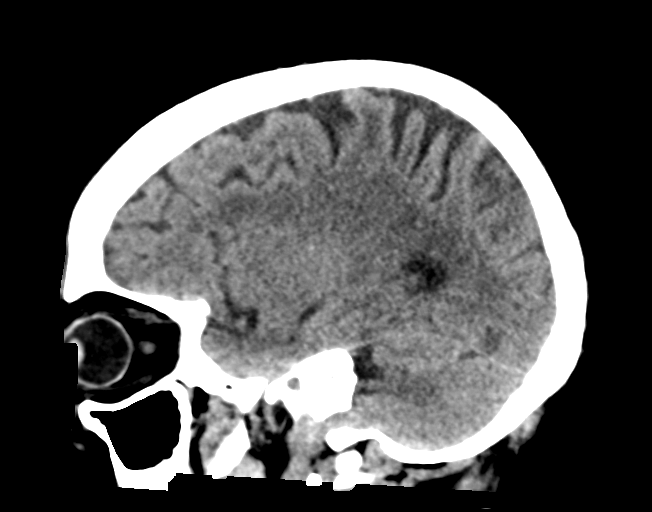

[16 of 47 positions shown; findings below may reference images not displayed]

FINDINGS: Brain: There is cytotoxic edema throughout the left MCA distribution
with areas of confluent petechial hemorrhage as demonstrated on the
prior MRI. There is 2 mm of rightward midline shift. Mass effect on
the left lateral ventricle has increased. There is encephalomalacia
at the site of an old left occipital lobe infarct.

Vascular: No abnormal hyperdensity of the major intracranial
arteries or dural venous sinuses. No intracranial atherosclerosis.

Skull: The visualized skull base, calvarium and extracranial soft
tissues are normal.

Sinuses/Orbits: Secretions within both maxillary sinuses. The orbits
are normal.
IMPRESSION: 1. Evolving left MCA distribution infarct with increased mass effect
on the left lateral ventricle and 2 mm of rightward midline shift.
2. Confluent petechial hemorrhage throughout the infarcted
territory, unchanged from the earlier MRI, allowing for differences
in modality.

## 2020-04-07 IMAGING — DX DG CHEST 1V PORT
1 series · 1 of 1 positions shown · non-contrast
Comparison: 01/28/2018.  01/27/2018.

CLINICAL DATA: Intubation.  Stroke.

EXAM:
PORTABLE CHEST 1 VIEW

[chest ap]
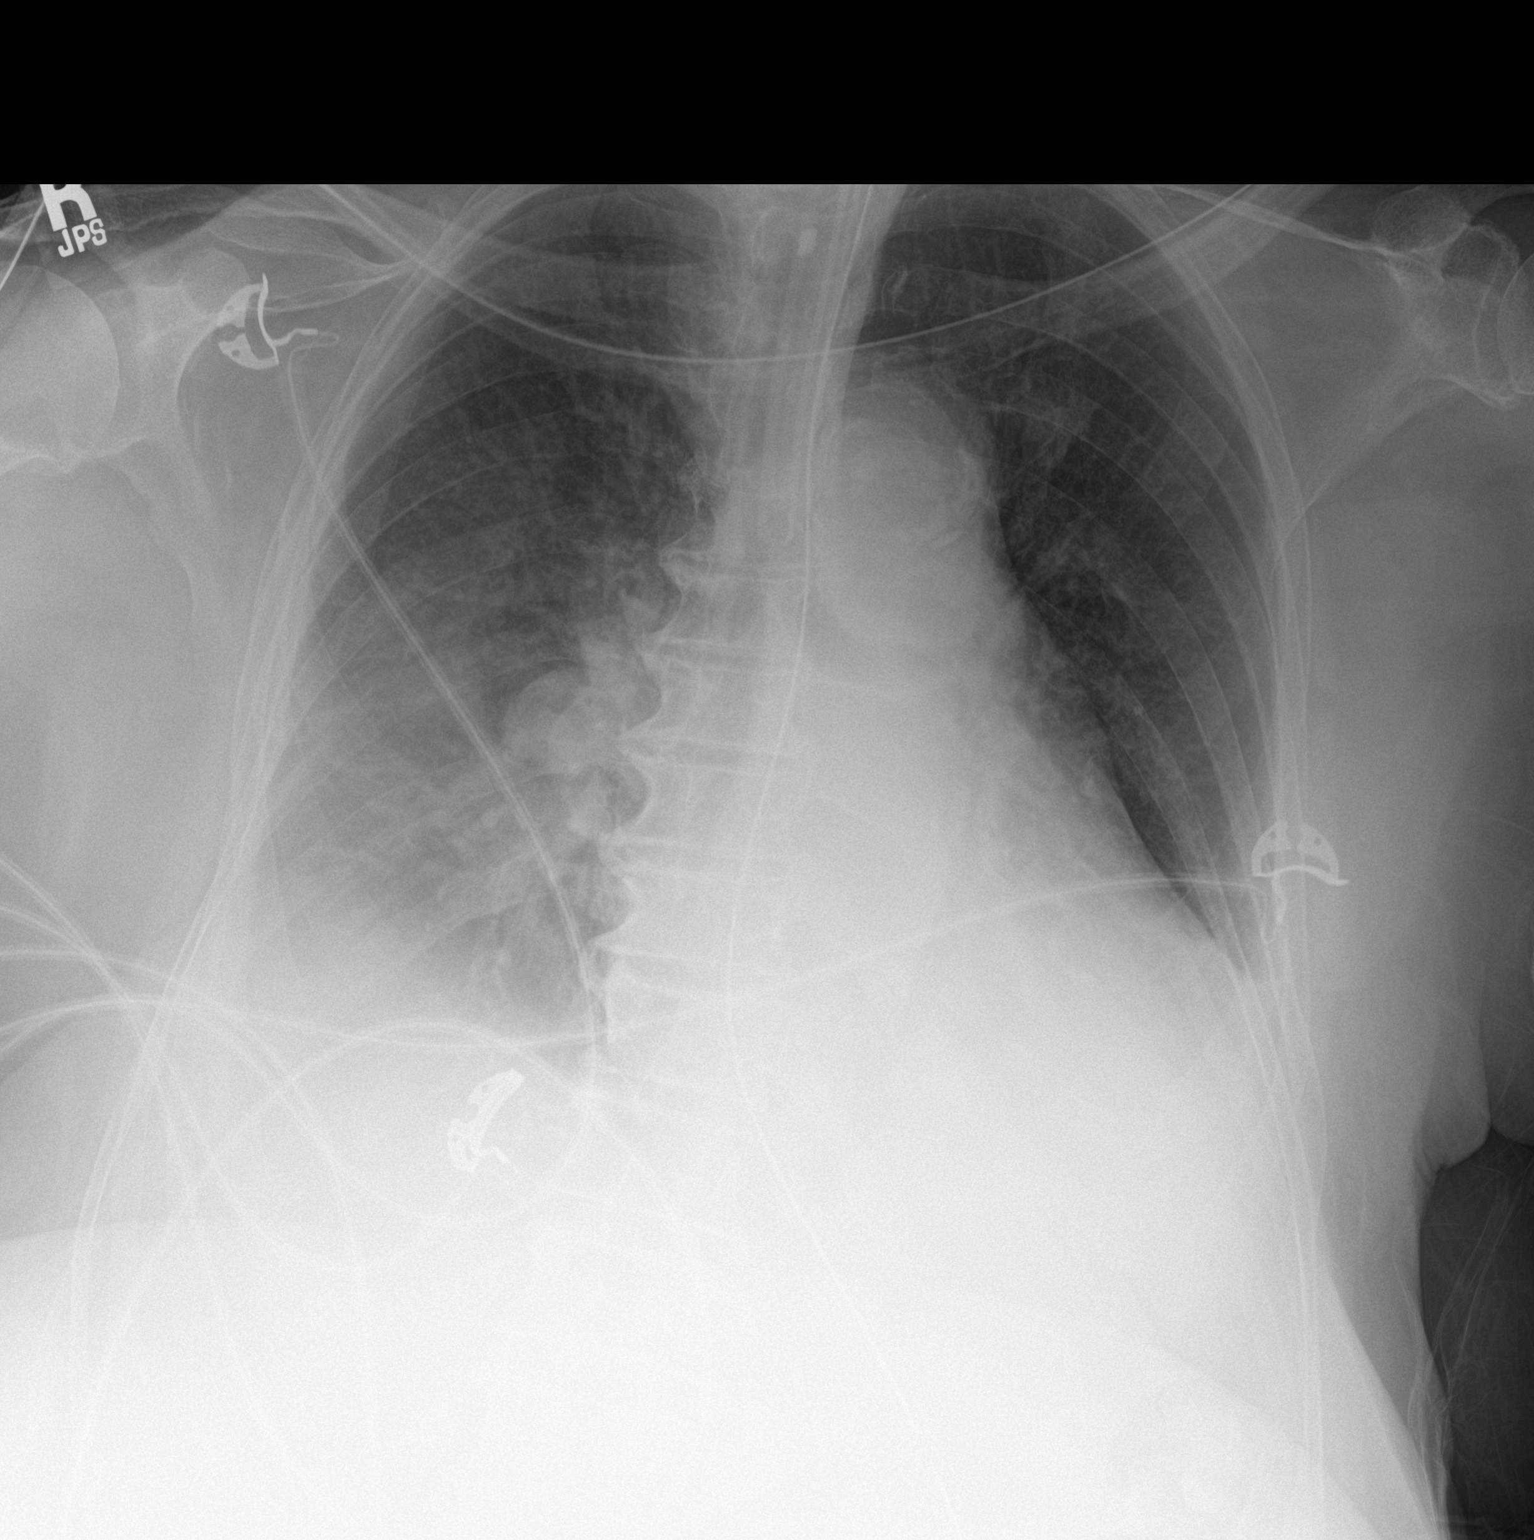

[1 of 1 positions shown; findings below may reference images not displayed]

FINDINGS: Endotracheal tube tip 9 mm above the carina. Proximal repositioning
of approximately 2-3 cm should be considered. NG tube tip noted
below left hemidiaphragm. Stable cardiomegaly. Progressive bilateral
pulmonary infiltrates and bilateral pleural effusions, particular
prominent the right. CHF could present in this fashion. Bilateral
pneumonia can not be excluded. No pneumothorax.
IMPRESSION: 1. Endotracheal tube tip 9 mm above the carina. Proximal
repositioning of approximately 2-3 cm should be considered. NG tube
in stable position.

2. Cardiomegaly. Bilateral pulmonary infiltrates and bilateral
pleural effusions, right side greater than left noted on today's
exam. Findings suggest CHF. Bilateral pneumonia can not be excluded.
